# Patient Record
Sex: Male | Born: 1980 | Race: Black or African American | Hispanic: No | Marital: Married | Smoking: Never smoker
Health system: Southern US, Community
[De-identification: ages and names within clinical notes are randomized; demographics above are authoritative.]

## PROBLEM LIST (undated history)

## (undated) DIAGNOSIS — K219 Gastro-esophageal reflux disease without esophagitis: Secondary | ICD-10-CM

## (undated) DIAGNOSIS — G4730 Sleep apnea, unspecified: Secondary | ICD-10-CM

## (undated) DIAGNOSIS — J45909 Unspecified asthma, uncomplicated: Secondary | ICD-10-CM

## (undated) DIAGNOSIS — F419 Anxiety disorder, unspecified: Secondary | ICD-10-CM

## (undated) HISTORY — DX: Anxiety disorder, unspecified: F41.9

## (undated) HISTORY — DX: Sleep apnea, unspecified: G47.30

## (undated) HISTORY — PX: NO PAST SURGERIES: SHX2092

---
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
## 2002-01-03 ENCOUNTER — Emergency Department (HOSPITAL_COMMUNITY): Admission: EM | Admit: 2002-01-03 | Discharge: 2002-01-03 | Payer: Self-pay | Admitting: Emergency Medicine

## 2002-11-30 ENCOUNTER — Emergency Department (HOSPITAL_COMMUNITY): Admission: AD | Admit: 2002-11-30 | Discharge: 2002-12-01 | Payer: Self-pay | Admitting: Emergency Medicine

## 2003-01-11 ENCOUNTER — Encounter: Payer: Self-pay | Admitting: Family Medicine

## 2003-01-11 ENCOUNTER — Ambulatory Visit (HOSPITAL_COMMUNITY): Admission: RE | Admit: 2003-01-11 | Discharge: 2003-01-11 | Payer: Self-pay | Admitting: Family Medicine

## 2007-04-01 ENCOUNTER — Ambulatory Visit (HOSPITAL_COMMUNITY): Admission: RE | Admit: 2007-04-01 | Discharge: 2007-04-01 | Payer: Self-pay | Admitting: Family Medicine

## 2007-06-08 ENCOUNTER — Emergency Department (HOSPITAL_COMMUNITY): Admission: EM | Admit: 2007-06-08 | Discharge: 2007-06-08 | Payer: Self-pay | Admitting: Emergency Medicine

## 2008-01-13 ENCOUNTER — Ambulatory Visit: Admission: RE | Admit: 2008-01-13 | Discharge: 2008-01-13 | Payer: Self-pay | Admitting: Family Medicine

## 2008-07-30 ENCOUNTER — Emergency Department (HOSPITAL_COMMUNITY): Admission: EM | Admit: 2008-07-30 | Discharge: 2008-07-30 | Payer: Self-pay | Admitting: Emergency Medicine

## 2010-09-22 ENCOUNTER — Other Ambulatory Visit: Payer: Self-pay | Admitting: Internal Medicine

## 2010-09-22 DIAGNOSIS — R1011 Right upper quadrant pain: Secondary | ICD-10-CM

## 2010-09-29 ENCOUNTER — Ambulatory Visit
Admission: RE | Admit: 2010-09-29 | Discharge: 2010-09-29 | Disposition: A | Discharge status: Home / Self Care | Payer: Managed Care, Other (non HMO) | Source: Ambulatory Visit | Attending: Internal Medicine | Admitting: Internal Medicine

## 2010-09-29 DIAGNOSIS — R1011 Right upper quadrant pain: Secondary | ICD-10-CM

## 2010-12-26 NOTE — Procedures (Signed)
NAME:  STOKELY, JEANCHARLES          ACCOUNT NO.:  [id number]   MEDICAL RECORD NO.:  [id number]          PATIENT TYPE:  OUT   LOCATION:  SLEE                          FACILITY:  [facility]   PHYSICIAN:  Kofi A. Gerilyn Pilgrim, M.D. DATE OF BIRTH:  1981/04/14   DATE OF PROCEDURE:  01/13/2008  DATE OF DISCHARGE:  01/13/2008                             SLEEP DISORDER REPORT   POLYSOMNOGRAPHY REPORT   REFERRING PHYSICIAN:  Angus G. McInnis, MD   INDICATIONS:  This is a 30 year old man who presents with snoring,  insomnia, and has been evaluated for obstructive sleep apnea syndrome.  Epworth sleepiness scale is 7.  BMI 36.   MEDICATIONS:  None.   SLEEP STAGE SUMMARY:  The total recording time is 395 minutes.  Sleep  efficiency 85%.  Sleep latency 45 minutes.  REM latency 9 minutes.  Stage N1 4%, N2 49%, N3 25%, and REM sleep 22%.   RESPIRATORY SUMMARY:  The AHI is 11 with a lowest oxygen saturation of  84%.  Baseline saturation is 98%.   LIMB MOVEMENT SUMMARY:  There are no limb movements observed.   ELECTROCARDIOGRAM SUMMARY:  Average heart rate is 75 with isolated PVCs  noted.   IMPRESSION:  Mild obstructive sleep apnea syndrome, not requiring  positive pressure. However, he still could benefit from positive  pressure. Therefore, a trials of autotitration unit at home or even a  formal titration study is recommended.      Kofi A. Gerilyn Pilgrim, M.D.  Electronically Signed     KAD/MEDQ  D:  01/17/2008  T:  01/18/2008  Job:  [id number]

## 2015-10-18 ENCOUNTER — Other Ambulatory Visit: Payer: Self-pay | Admitting: Geriatric Medicine

## 2015-10-18 DIAGNOSIS — R1011 Right upper quadrant pain: Secondary | ICD-10-CM

## 2015-10-26 ENCOUNTER — Other Ambulatory Visit: Payer: Managed Care, Other (non HMO)

## 2015-10-26 ENCOUNTER — Ambulatory Visit
Admission: RE | Admit: 2015-10-26 | Discharge: 2015-10-26 | Disposition: A | Discharge status: Home / Self Care | Payer: BLUE CROSS/BLUE SHIELD | Source: Ambulatory Visit | Attending: Geriatric Medicine | Admitting: Geriatric Medicine

## 2015-10-26 DIAGNOSIS — R1011 Right upper quadrant pain: Secondary | ICD-10-CM

## 2015-10-26 MED ORDER — IOPAMIDOL (ISOVUE-300) INJECTION 61%
125.0000 mL | Freq: Once | INTRAVENOUS | Status: AC | PRN
  Administered 2015-10-26: 125 mL via INTRAVENOUS

## 2015-10-27 ENCOUNTER — Other Ambulatory Visit: Payer: Managed Care, Other (non HMO)

## 2015-11-15 ENCOUNTER — Other Ambulatory Visit: Payer: Managed Care, Other (non HMO)

## 2016-04-25 ENCOUNTER — Ambulatory Visit
Admission: RE | Admit: 2016-04-25 | Discharge: 2016-04-25 | Disposition: A | Discharge status: Home / Self Care | Payer: BLUE CROSS/BLUE SHIELD | Source: Ambulatory Visit | Attending: Internal Medicine | Admitting: Internal Medicine

## 2016-04-25 ENCOUNTER — Other Ambulatory Visit: Payer: Self-pay | Admitting: Internal Medicine

## 2016-04-25 DIAGNOSIS — M542 Cervicalgia: Secondary | ICD-10-CM

## 2016-05-29 DIAGNOSIS — K219 Gastro-esophageal reflux disease without esophagitis: Secondary | ICD-10-CM | POA: Insufficient documentation

## 2016-08-02 DIAGNOSIS — G4733 Obstructive sleep apnea (adult) (pediatric): Secondary | ICD-10-CM | POA: Insufficient documentation

## 2016-08-16 ENCOUNTER — Other Ambulatory Visit: Payer: Self-pay | Admitting: Internal Medicine

## 2016-08-16 DIAGNOSIS — M542 Cervicalgia: Secondary | ICD-10-CM

## 2016-08-20 ENCOUNTER — Other Ambulatory Visit: Payer: BLUE CROSS/BLUE SHIELD

## 2016-08-20 ENCOUNTER — Ambulatory Visit
Admission: RE | Admit: 2016-08-20 | Discharge: 2016-08-20 | Disposition: A | Discharge status: Home / Self Care | Payer: BLUE CROSS/BLUE SHIELD | Source: Ambulatory Visit | Attending: Internal Medicine | Admitting: Internal Medicine

## 2016-08-20 DIAGNOSIS — M542 Cervicalgia: Secondary | ICD-10-CM

## 2016-10-14 ENCOUNTER — Emergency Department (HOSPITAL_COMMUNITY)
Admission: EM | Admit: 2016-10-14 | Discharge: 2016-10-14 | Disposition: A | Discharge status: Home / Self Care | Payer: BLUE CROSS/BLUE SHIELD | Attending: Emergency Medicine | Admitting: Emergency Medicine

## 2016-10-14 ENCOUNTER — Emergency Department (HOSPITAL_COMMUNITY): Payer: BLUE CROSS/BLUE SHIELD

## 2016-10-14 ENCOUNTER — Encounter (HOSPITAL_COMMUNITY): Payer: Self-pay

## 2016-10-14 DIAGNOSIS — Z5321 Procedure and treatment not carried out due to patient leaving prior to being seen by health care provider: Secondary | ICD-10-CM | POA: Insufficient documentation

## 2016-10-14 DIAGNOSIS — J45909 Unspecified asthma, uncomplicated: Secondary | ICD-10-CM | POA: Insufficient documentation

## 2016-10-14 DIAGNOSIS — R0789 Other chest pain: Secondary | ICD-10-CM | POA: Insufficient documentation

## 2016-10-14 DIAGNOSIS — R0602 Shortness of breath: Secondary | ICD-10-CM | POA: Diagnosis not present

## 2016-10-14 HISTORY — DX: Unspecified asthma, uncomplicated: J45.909

## 2016-10-14 LAB — BASIC METABOLIC PANEL
Anion gap: 7 (ref 5–15)
BUN: 20 mg/dL (ref 6–20)
CHLORIDE: 106 mmol/L (ref 101–111)
CO2: 27 mmol/L (ref 22–32)
Calcium: 9.7 mg/dL (ref 8.9–10.3)
Creatinine, Ser: 1.19 mg/dL (ref 0.61–1.24)
GFR calc non Af Amer: >60 mL/min (ref >60)
Glucose, Bld: 104 mg/dL — ABNORMAL HIGH (ref 65–99)
POTASSIUM: 3.7 mmol/L (ref 3.5–5.1)
SODIUM: 140 mmol/L (ref 135–145)

## 2016-10-14 LAB — CBC
HEMATOCRIT: 42.9 % (ref 39.0–52.0)
Hemoglobin: 14.1 g/dL (ref 13.0–17.0)
MCH: 28.9 pg (ref 26.0–34.0)
MCHC: 32.9 g/dL (ref 30.0–36.0)
MCV: 87.9 fL (ref 78.0–100.0)
Platelets: 180 10*3/uL (ref 150–400)
RBC: 4.88 MIL/uL (ref 4.22–5.81)
RDW: 12.9 % (ref 11.5–15.5)
WBC: 4.3 10*3/uL (ref 4.0–10.5)

## 2016-10-14 LAB — I-STAT TROPONIN, ED: Troponin i, poc: 0 ng/mL (ref 0.00–0.08)

## 2016-10-14 NOTE — ED Notes (Signed)
Patient transported to X-ray 

## 2016-10-14 NOTE — ED Notes (Signed)
Pt in 37, came back to nurse 1st and stated, Im not going to wait, I have to take my medicine at 12 for anxiety.  I will just call my doctor. This is just too long of a wait

## 2016-10-14 NOTE — ED Triage Notes (Addendum)
Patient complains of chest tightness and some shortness of breath since Thursday, appears anxious on arrival. Non-smoker. Also reports that he has GERD and thinks related. Patient states that he takes lorazepam and has not had any today

## 2016-11-07 ENCOUNTER — Encounter: Payer: Self-pay | Admitting: Emergency Medicine

## 2016-11-07 ENCOUNTER — Emergency Department
Admission: EM | Admit: 2016-11-07 | Discharge: 2016-11-07 | Disposition: A | Discharge status: Home / Self Care | Payer: BLUE CROSS/BLUE SHIELD | Attending: Student in an Organized Health Care Education/Training Program | Admitting: Student in an Organized Health Care Education/Training Program

## 2016-11-07 DIAGNOSIS — J45909 Unspecified asthma, uncomplicated: Secondary | ICD-10-CM | POA: Insufficient documentation

## 2016-11-07 DIAGNOSIS — F0781 Postconcussional syndrome: Secondary | ICD-10-CM | POA: Diagnosis not present

## 2016-11-07 DIAGNOSIS — Y92410 Unspecified street and highway as the place of occurrence of the external cause: Secondary | ICD-10-CM | POA: Diagnosis not present

## 2016-11-07 DIAGNOSIS — R51 Headache: Secondary | ICD-10-CM | POA: Diagnosis not present

## 2016-11-07 DIAGNOSIS — S0990XA Unspecified injury of head, initial encounter: Secondary | ICD-10-CM | POA: Diagnosis present

## 2016-11-07 DIAGNOSIS — Y9389 Activity, other specified: Secondary | ICD-10-CM | POA: Insufficient documentation

## 2016-11-07 DIAGNOSIS — Y999 Unspecified external cause status: Secondary | ICD-10-CM | POA: Insufficient documentation

## 2016-11-07 MED ORDER — NAPROXEN 500 MG PO TABS: 500.0000 mg | ORAL_TABLET | Freq: Two times a day (BID) | ORAL | 0 refills | Status: DC

## 2016-11-07 MED ORDER — MECLIZINE HCL 32 MG PO TABS: 32.0000 mg | ORAL_TABLET | Freq: Three times a day (TID) | ORAL | 0 refills | Status: DC | PRN
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
## 2016-12-04 DIAGNOSIS — E669 Obesity, unspecified: Secondary | ICD-10-CM | POA: Insufficient documentation

## 2016-12-04 DIAGNOSIS — F411 Generalized anxiety disorder: Secondary | ICD-10-CM | POA: Insufficient documentation

## 2017-09-17 DIAGNOSIS — G4733 Obstructive sleep apnea (adult) (pediatric): Secondary | ICD-10-CM | POA: Diagnosis not present

## 2017-09-26 DIAGNOSIS — J3489 Other specified disorders of nose and nasal sinuses: Secondary | ICD-10-CM | POA: Diagnosis not present

## 2017-09-26 DIAGNOSIS — R6883 Chills (without fever): Secondary | ICD-10-CM | POA: Diagnosis not present

## 2017-10-30 DIAGNOSIS — G4733 Obstructive sleep apnea (adult) (pediatric): Secondary | ICD-10-CM | POA: Diagnosis not present

## 2017-11-10 DIAGNOSIS — G4733 Obstructive sleep apnea (adult) (pediatric): Secondary | ICD-10-CM | POA: Diagnosis not present

## 2017-12-10 DIAGNOSIS — G4733 Obstructive sleep apnea (adult) (pediatric): Secondary | ICD-10-CM | POA: Diagnosis not present

## 2018-01-10 DIAGNOSIS — G4733 Obstructive sleep apnea (adult) (pediatric): Secondary | ICD-10-CM | POA: Diagnosis not present

## 2018-02-09 DIAGNOSIS — G4733 Obstructive sleep apnea (adult) (pediatric): Secondary | ICD-10-CM | POA: Diagnosis not present

## 2018-03-12 DIAGNOSIS — G4733 Obstructive sleep apnea (adult) (pediatric): Secondary | ICD-10-CM | POA: Diagnosis not present

## 2018-03-13 DIAGNOSIS — K644 Residual hemorrhoidal skin tags: Secondary | ICD-10-CM | POA: Diagnosis not present

## 2018-03-13 DIAGNOSIS — Z0000 Encounter for general adult medical examination without abnormal findings: Secondary | ICD-10-CM | POA: Diagnosis not present

## 2018-03-13 DIAGNOSIS — G4733 Obstructive sleep apnea (adult) (pediatric): Secondary | ICD-10-CM | POA: Diagnosis not present

## 2018-03-13 LAB — LIPID PANEL
CHOLESTEROL: 201 — AB (ref 0–200)
HDL: 71 — AB (ref 35–70)
LDL CALC: 109
LDl/HDL Ratio: 2.8
Triglycerides: 102 (ref 40–160)

## 2018-03-25 DIAGNOSIS — G4733 Obstructive sleep apnea (adult) (pediatric): Secondary | ICD-10-CM | POA: Diagnosis not present

## 2018-04-12 DIAGNOSIS — G4733 Obstructive sleep apnea (adult) (pediatric): Secondary | ICD-10-CM | POA: Diagnosis not present

## 2018-05-12 DIAGNOSIS — G4733 Obstructive sleep apnea (adult) (pediatric): Secondary | ICD-10-CM | POA: Diagnosis not present

## 2018-08-19 DIAGNOSIS — J4520 Mild intermittent asthma, uncomplicated: Secondary | ICD-10-CM | POA: Diagnosis not present

## 2018-08-19 DIAGNOSIS — J189 Pneumonia, unspecified organism: Secondary | ICD-10-CM | POA: Diagnosis not present

## 2018-09-12 ENCOUNTER — Ambulatory Visit: Payer: 59 | Admitting: Internal Medicine

## 2018-09-30 ENCOUNTER — Encounter: Payer: Self-pay | Admitting: Family Medicine

## 2018-09-30 ENCOUNTER — Ambulatory Visit (INDEPENDENT_AMBULATORY_CARE_PROVIDER_SITE_OTHER): Payer: 59 | Admitting: Family Medicine

## 2018-09-30 VITALS — BP 138/98 | HR 72 | Temp 98.1°F | Ht 72.0 in | Wt 271.2 lb

## 2018-09-30 DIAGNOSIS — R1011 Right upper quadrant pain: Secondary | ICD-10-CM | POA: Diagnosis not present

## 2018-09-30 DIAGNOSIS — F419 Anxiety disorder, unspecified: Secondary | ICD-10-CM

## 2018-09-30 MED ORDER — LORAZEPAM 1 MG PO TABS: 1.0000 mg | ORAL_TABLET | Freq: Every day | ORAL | 0 refills | Status: DC

## 2018-09-30 NOTE — Assessment & Plan Note (Signed)
Concerning for GB vs GERD. Will get [place] to rule-out GB. Also advised dietary changes to help with reflux.

## 2018-09-30 NOTE — Assessment & Plan Note (Signed)
GAD elevated today. Strongly advised against regular use of lorazepam and advised starting SSRI though patient declined. He is hoping to quit alcohol which has helped in the past. Will continue to monitor and encourage SSRI if symptoms worse. Will need contract at next appointment for lorazepam.

## 2018-09-30 NOTE — Patient Instructions (Signed)
#  Stomach issue - cut back on alcohol - we will get an ultrasound - If the Ultrasound does not show gallbladder - likely heartburn  #anxiety - consider starting a long acting medication for anxiety - like Zoloft or Prozac - refill of Lorazepam - Return for appointment before you run out of medication - likely 6 weeks

## 2018-09-30 NOTE — Progress Notes (Signed)
Subjective:     Carlos Wheeler is a 38 y.o. male presenting for [facility] (previous PCP with [facility]); Stomach issue (Symptoms have been present for a few years. Certain foods make him feel bloated after consuming. Also gets a burning sensation in the upper right abdomen area. Previous PCP has addressed this.); and Insomnia (takes Lorazepam at bedtime. Does wear CPAP machine-does not feel like this works. )     HPI   #Anxiety - taking lorazepam at night to calm him down - has 3 year olds that often sleep with them - wears CPAP - has been busy with children, building a house, starting a business - tried Buspar w/o improvement - hx of ADHD and took Ritalin as child but completed college/grad school w/o issue   Uses alcohol to end the day Feels he should cut back When he cut out alcohol for 6 months - he didn't need anything  #Stomach issue - when drinking certain foods or alcohol on an empty stomach - stomach will feel full - endorses some mild burning which will go away - if full stomach will feel better - triggered with greasy food  - location is right upper quadrant - lasts for about 1 hour - will also feel bloated - sister and grandparents with gallbladder issues - tried omeprazole w/o improvement - tea with ginger seems to help   Review of Systems  Gastrointestinal: Positive for abdominal distention and abdominal pain. Negative for diarrhea, nausea and vomiting.  Psychiatric/Behavioral: Negative for suicidal ideas. The patient is nervous/anxious.      Social History   Tobacco Use  Smoking Status Never Smoker  Smokeless Tobacco Never Used        Objective:    BP Readings from Last 3 Encounters:  09/30/18 (!) 138/98  11/07/16 (!) 141/90  10/14/16 155/96   Wt Readings from Last 3 Encounters:  09/30/18 271 lb 4 oz (123 kg)  11/07/16 251 lb (113.9 kg)  10/14/16 250 lb (113.4 kg)    BP (!) 138/98   Pulse 72   Temp 98.1 F (36.7  C)   Ht 6' (1.829 m)   Wt 271 lb 4 oz (123 kg)   SpO2 96%   BMI 36.79 kg/m    Physical Exam Constitutional:      Appearance: Normal appearance. He is not ill-appearing or diaphoretic.  HENT:     Right Ear: External ear normal.     Left Ear: External ear normal.     Nose: Nose normal.  Eyes:     General: No scleral icterus.    Extraocular Movements: Extraocular movements intact.     Conjunctiva/sclera: Conjunctivae normal.  Neck:     Musculoskeletal: Neck supple.  Cardiovascular:     Rate and Rhythm: Normal rate and regular rhythm.     Heart sounds: No murmur.  Pulmonary:     Effort: Pulmonary effort is normal. No respiratory distress.     Breath sounds: Normal breath sounds. No wheezing.  Abdominal:     General: Abdomen is flat. Bowel sounds are normal.     Palpations: Abdomen is soft.     Tenderness: There is no abdominal tenderness. There is no guarding or rebound. Negative signs include Murphy's sign.  Skin:    General: Skin is warm and dry.  Neurological:     Mental Status: He is alert. Mental status is at baseline.  Psychiatric:        Mood and Affect: Mood normal.  GAD 7 : Generalized Anxiety Score 09/30/2018  Nervous, Anxious, on Edge 3  Control/stop worrying 1  Worry too much - different things 2  Trouble relaxing 3  Restless 3  Easily annoyed or irritable 1  Afraid - awful might happen 1  Total GAD 7 Score 14  Anxiety Difficulty Not difficult at all          Assessment & Plan:   Problem List Items Addressed This Visit      Other   Colicky RUQ abdominal pain - Primary    Concerning for GB vs GERD. Will get [place] to rule-out GB. Also advised dietary changes to help with reflux.       Relevant Orders   US Abdomen Limited RUQ   Anxiety    GAD elevated today. Strongly advised against regular use of lorazepam and advised starting SSRI though patient declined. He is hoping to quit alcohol which has helped in the past. Will continue to monitor and  encourage SSRI if symptoms worse. Will need contract at next appointment for lorazepam.       Relevant Medications   LORazepam (ATIVAN) 1 MG tablet       Return in about 6 weeks (around 11/11/2018).  [name], MD

## 2018-10-06 ENCOUNTER — Ambulatory Visit
Admission: RE | Admit: 2018-10-06 | Discharge: 2018-10-06 | Disposition: A | Discharge status: Home / Self Care | Payer: 59 | Source: Ambulatory Visit | Attending: Family Medicine | Admitting: Family Medicine

## 2018-10-06 DIAGNOSIS — R140 Abdominal distension (gaseous): Secondary | ICD-10-CM | POA: Diagnosis not present

## 2018-10-06 DIAGNOSIS — R1011 Right upper quadrant pain: Secondary | ICD-10-CM

## 2018-10-07 ENCOUNTER — Telehealth: Payer: Self-pay | Admitting: Family Medicine

## 2018-10-07 DIAGNOSIS — K219 Gastro-esophageal reflux disease without esophagitis: Secondary | ICD-10-CM

## 2018-10-07 MED ORDER — PANTOPRAZOLE SODIUM 20 MG PO TBEC: 40.0000 mg | DELAYED_RELEASE_TABLET | Freq: Every day | ORAL | 1 refills | Status: DC

## 2018-10-07 NOTE — Telephone Encounter (Signed)
Routing to MA to notify patient of the following:   1. Ultrasound was normal. No sign of gallstones  2. Suspect pain may be reflux related - sent a prescription for Pantoprazole to the pharmacy. Take this on an empty stomach and then eat with in 30-60 minutes.    3. Do not lay down immediately after eating, consider elevating the head of your bed. Avoid foods that trigger symptoms   4. Make a follow-up appointment in 4 weeks to check in  [name]

## 2018-10-07 NOTE — Telephone Encounter (Signed)
Patient advised.

## 2018-10-24 ENCOUNTER — Ambulatory Visit (INDEPENDENT_AMBULATORY_CARE_PROVIDER_SITE_OTHER): Payer: 59 | Admitting: Family Medicine

## 2018-10-24 ENCOUNTER — Other Ambulatory Visit: Payer: Self-pay

## 2018-10-24 ENCOUNTER — Encounter: Payer: Self-pay | Admitting: Family Medicine

## 2018-10-24 VITALS — BP 146/74 | HR 88 | Temp 98.9°F | Ht 72.0 in | Wt 264.2 lb

## 2018-10-24 DIAGNOSIS — J209 Acute bronchitis, unspecified: Secondary | ICD-10-CM | POA: Diagnosis not present

## 2018-10-24 MED ORDER — AZITHROMYCIN 250 MG PO TABS: ORAL_TABLET | ORAL | 0 refills | Status: DC

## 2018-10-24 MED ORDER — PREDNISONE 10 MG PO TABS: ORAL_TABLET | ORAL | 0 refills | Status: DC

## 2018-10-24 MED ORDER — HYDROCOD POLST-CPM POLST ER 10-8 MG/5ML PO SUER: 5.0000 mL | Freq: Every evening | ORAL | 0 refills | Status: DC | PRN

## 2018-10-24 NOTE — Patient Instructions (Signed)
Use your nebulized albuterol as needed every 4-6 hours for wheezing  If wheezing worsens-fill the px for prednisone   Drink lots of fluid  Rest when you can - I sent cough syrup (tussionex)   Take zpak as directed for sinus or lung infection    Update if not starting to improve in a week or if worsening

## 2018-10-24 NOTE — Progress Notes (Signed)
Subjective:    Patient ID: Carlos Wheeler, male    DOB: Dec 20, 1980, 38 y.o.   MRN: [id number]  HPI  38 yo pt of Dr Selena Batten here with URI symptoms   Was diagnosed with pneumonia at [facility] ([facility]) -at [facility] about a month ago Has had it before   Took only a few days of abx because they made him feel bad- cannot remember the name of it  GI side effects  He still felt some better   Then over a week ago- got worse again  Wheezing - Albuterol 2.5 -in nebulizer  Also MDI -has not used  Coughing a lot at night  Also wears cpap -hard to get rest   Nasal symptoms - very congested  Afrin -about 3 days    Cough is productive (was green/not clear)   No fever  Feels warm occ No chills or aches   Has asthma symptoms (diagnosed 15 y ago) Flares up when he gets sick Also has allergies ([place])   He does not like prednisone- does not like the way he feels   Hydrocodone-chloramphen ER -about to run out (70 ml)  Makes him sleepy   otc Mucinex DM    Temp: 98.9 F (37.2 C)  Pulse ox is 94%  Patient Active Problem List   Diagnosis Date Noted  . Acute bronchitis 10/24/2018  . Colicky RUQ abdominal pain 09/30/2018  . Anxiety 09/30/2018  . Obesity (BMI 30.0-34.9) 12/04/2016  . Obstructive sleep apnea of adult 08/02/2016   Past Medical History:  Diagnosis Date  . Anxiety   . Asthma   . Sleep apnea    Past Surgical History:  Procedure Laterality Date  . NO PAST SURGERIES     Social History   Tobacco Use  . Smoking status: Never Smoker  . Smokeless tobacco: Never Used  Substance Use Topics  . Alcohol use: Yes    Comment: sometimes daily- 2-3 liquor servings  . Drug use: No   Family History  Problem Relation Age of Onset  . Hyperlipidemia Mother   . Hypertension Mother   . Heart attack Father 63  . Heart disease Father   . Hypertension Father   . Diabetes Sister   . Hyperlipidemia Sister   . Hypertension Sister   . Diabetes Maternal Grandmother   .  Hypertension Maternal Grandmother   . Diabetes Maternal Grandfather   . Hypertension Maternal Grandfather   . Heart attack Maternal Grandfather 70  . Diabetes Paternal Grandmother   . Stroke Paternal Grandmother 21  . Diabetes Paternal Grandfather   . Cancer Neg Hx    Allergies  Allergen Reactions  . Amoxicillin Nausea Only    Other reaction(s): Other (See Comments), Vomiting Causes N/V and GI upset  . Montelukast     Other reaction(s): Confusion (intolerance), Other (See Comments) Causes forgetfulness    Current Outpatient Medications on File Prior to Visit  Medication Sig Dispense Refill  . albuterol (PROVENTIL HFA;VENTOLIN HFA) 108 (90 Base) MCG/ACT inhaler Inhale 1-2 puffs into the lungs every 6 (six) hours as needed.     Marland Kitchen LORazepam (ATIVAN) 1 MG tablet Take 1 tablet (1 mg total) by mouth at bedtime. 30 tablet 0  . pantoprazole (PROTONIX) 20 MG tablet Take 2 tablets (40 mg total) by mouth daily. 30 tablet 1   No current facility-administered medications on file prior to visit.     Review of Systems  Constitutional: Positive for appetite change and fatigue. Negative for fever.  HENT: Positive for congestion, postnasal drip, rhinorrhea, sinus pressure, sneezing and sore throat. Negative for ear pain.   Eyes: Negative for pain and discharge.  Respiratory: Positive for cough, chest tightness and wheezing. Negative for shortness of breath and stridor.   Cardiovascular: Negative for chest pain.  Gastrointestinal: Negative for diarrhea, nausea and vomiting.  Genitourinary: Negative for frequency, hematuria and urgency.  Musculoskeletal: Negative for arthralgias and myalgias.  Skin: Negative for rash.  Neurological: Positive for headaches. Negative for dizziness, weakness and light-headedness.  Psychiatric/Behavioral: Negative for confusion and dysphoric mood.       Objective:   Physical Exam Constitutional:      General: He is not in acute distress.    Appearance: Normal  appearance. He is well-developed. He is obese. He is not ill-appearing, toxic-appearing or diaphoretic.  HENT:     Head: Normocephalic and atraumatic.     Comments: Nares are injected and congested      Right Ear: Tympanic membrane, ear canal and external ear normal.     Left Ear: Tympanic membrane, ear canal and external ear normal.     Nose: Congestion and rhinorrhea present.     Mouth/Throat:     Mouth: Mucous membranes are moist.     Pharynx: Oropharynx is clear. No oropharyngeal exudate or posterior oropharyngeal erythema.     Comments: Clear pnd  Eyes:     General:        Right eye: No discharge.        Left eye: No discharge.     Conjunctiva/sclera: Conjunctivae normal.     Pupils: Pupils are equal, round, and reactive to light.  Neck:     Musculoskeletal: Normal range of motion and neck supple.  Cardiovascular:     Rate and Rhythm: Normal rate.     Heart sounds: Normal heart sounds.  Pulmonary:     Effort: Pulmonary effort is normal. No respiratory distress.     Breath sounds: No stridor. Wheezing and rhonchi present. No rales.     Comments: Mild exp wheezes No prolonged exp phase Scattered rhonchi Chest:     Chest wall: No tenderness.  Lymphadenopathy:     Cervical: No cervical adenopathy.  Skin:    General: Skin is warm and dry.     Capillary Refill: Capillary refill takes less than 2 seconds.     Findings: No rash.  Neurological:     Mental Status: He is alert.     Cranial Nerves: No cranial nerve deficit.  Psychiatric:        Mood and Affect: Mood normal.           Assessment & Plan:   Problem List Items Addressed This Visit      Respiratory   Acute bronchitis - Primary    In the setting of partially tx pneumonia in January  Reassuring exam  Pt has wheezing but prefers no prednisone unless abs necessary (printed px for 30 mg taper to fill if he worsens)  Will continue albuterol Px zpak and tussionex (caution of sedation and habit) Disc  symptomatic care - see instructions on AVS  AVS Use your nebulized albuterol as needed every 4-6 hours for wheezing  If wheezing worsens-fill the px for prednisone   Drink lots of fluid  Rest when you can - I sent cough syrup (tussionex)   Take zpak as directed for sinus or lung infection    Update if not starting to improve in a week or if worsening

## 2018-10-26 NOTE — Assessment & Plan Note (Signed)
In the setting of partially tx pneumonia in January  Reassuring exam  Pt has wheezing but prefers no prednisone unless abs necessary (printed px for 30 mg taper to fill if he worsens)  Will continue albuterol Px zpak and tussionex (caution of sedation and habit) Disc symptomatic care - see instructions on AVS  AVS Use your nebulized albuterol as needed every 4-6 hours for wheezing  If wheezing worsens-fill the px for prednisone   Drink lots of fluid  Rest when you can - I sent cough syrup (tussionex)   Take zpak as directed for sinus or lung infection    Update if not starting to improve in a week or if worsening

## 2018-11-25 ENCOUNTER — Ambulatory Visit (INDEPENDENT_AMBULATORY_CARE_PROVIDER_SITE_OTHER): Payer: 59 | Admitting: Family Medicine

## 2018-11-25 ENCOUNTER — Encounter: Payer: Self-pay | Admitting: Family Medicine

## 2018-11-25 ENCOUNTER — Other Ambulatory Visit: Payer: Self-pay

## 2018-11-25 VITALS — Temp 97.6°F

## 2018-11-25 DIAGNOSIS — K219 Gastro-esophageal reflux disease without esophagitis: Secondary | ICD-10-CM | POA: Diagnosis not present

## 2018-11-25 DIAGNOSIS — R030 Elevated blood-pressure reading, without diagnosis of hypertension: Secondary | ICD-10-CM | POA: Insufficient documentation

## 2018-11-25 DIAGNOSIS — F419 Anxiety disorder, unspecified: Secondary | ICD-10-CM | POA: Diagnosis not present

## 2018-11-25 DIAGNOSIS — R631 Polydipsia: Secondary | ICD-10-CM | POA: Diagnosis not present

## 2018-11-25 NOTE — Progress Notes (Signed)
I connected with Carlos Wheeler on 11/25/18 at  2:40 PM EDT by video and verified that I am speaking with the correct person using two identifiers.   I discussed the limitations, risks, security and privacy concerns of performing an evaluation and management service by video and the availability of in person appointments. I also discussed with the patient that there may be a patient responsible charge related to this service. The patient expressed understanding and agreed to proceed.  Patient location: Home Provider Location: [place] [facility] [place] Participants: Carlos ChildJessica R Malvin Morrish and Carlos Wheeler   Subjective:     Carlos Wheeler is a 38 y.o. 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  Gastrointestinal: Positive for abdominal pain (hungar pains). Negative for nausea and vomiting.  Endocrine: Positive for polydipsia. Negative for polyuria.  Psychiatric/Behavioral: Negative for decreased concentration. The patient is not nervous/anxious.     09/30/2018: Clinic - Abdominal pain - normal US, started pantoprazol and preventive GERD treatment. Anxiety - declined SSRI  Social History   Tobacco Use  Smoking Status Never Smoker  Smokeless Tobacco Never Used        Objective:   BP Readings from Last 3 Encounters:  10/24/18 (!) 146/74  09/30/18 (!) 138/98  11/07/16 (!) 141/90    Wt Readings from Last 3 Encounters:  10/24/18 264 lb 3 oz (119.8 kg)  09/30/18 271 lb 4 oz (123 kg)  11/07/16 251 lb (113.9 kg)    Temp 97.6 F (36.4 C)    Physical Exam Constitutional:      Appearance: Normal appearance.  HENT:     Head: Normocephalic and atraumatic.     Nose: Nose normal.  Eyes:     Conjunctiva/sclera: Conjunctivae normal.  Pulmonary:     Effort: Pulmonary effort is normal. No respiratory distress.     Breath sounds: No wheezing.  Neurological:     Mental Status: He is alert.       GAD 7 : Generalized Anxiety Score 11/25/2018 09/30/2018  Nervous, Anxious, on Edge 2 3  Control/stop worrying 1 1  Worry too much - different things 2 2  Trouble relaxing 0 3  Restless 2 3  Easily annoyed or irritable 0 1  Afraid - awful might happen 0 1  Total GAD 7 Score 7 14  Anxiety Difficulty Not difficult at all Not difficult at all         Assessment & Plan:   Problem List Items Addressed This Visit      Other   Anxiety - Primary    Improved, continues with rare use of Lorazepam. Will continue to monitoring. If benzo use increases will readdress need for daily medication      Increased thirst    May be dehydration due to CPAP machine, but also discussed screening for diabetes given  weight and famhx. Pt will wait until annual this summer to do additional lab tests.       Elevated blood pressure reading    Unable to obtain BP today due to video visit - will plan to follow-up at next annual       Other Visit Diagnoses    Gastroesophageal reflux disease, esophagitis presence not specified         GERD seems resolved  Return in about 3 months (around 02/24/2019) for annual visit and labs.  Carlos Child, MD

## 2018-11-25 NOTE — Assessment & Plan Note (Signed)
May be dehydration due to CPAP machine, but also discussed screening for diabetes given weight and famhx. Pt will wait until annual this summer to do additional lab tests.

## 2018-11-25 NOTE — Assessment & Plan Note (Signed)
Unable to obtain BP today due to video visit - will plan to follow-up at next annual

## 2018-11-25 NOTE — Assessment & Plan Note (Signed)
Improved, continues with rare use of Lorazepam. Will continue to monitoring. If benzo use increases will readdress need for daily medication

## 2018-11-26 DIAGNOSIS — G4733 Obstructive sleep apnea (adult) (pediatric): Secondary | ICD-10-CM | POA: Diagnosis not present

## 2018-12-06 DIAGNOSIS — J069 Acute upper respiratory infection, unspecified: Secondary | ICD-10-CM | POA: Diagnosis not present

## 2019-02-05 ENCOUNTER — Ambulatory Visit (INDEPENDENT_AMBULATORY_CARE_PROVIDER_SITE_OTHER): Payer: 59 | Admitting: Family Medicine

## 2019-02-05 ENCOUNTER — Encounter: Payer: Self-pay | Admitting: Family Medicine

## 2019-02-05 ENCOUNTER — Other Ambulatory Visit: Payer: Self-pay

## 2019-02-05 VITALS — BP 148/96 | HR 70 | Temp 98.4°F | Resp 18 | Ht 72.0 in | Wt 260.0 lb

## 2019-02-05 DIAGNOSIS — I10 Essential (primary) hypertension: Secondary | ICD-10-CM

## 2019-02-05 DIAGNOSIS — Z0000 Encounter for general adult medical examination without abnormal findings: Secondary | ICD-10-CM | POA: Diagnosis not present

## 2019-02-05 DIAGNOSIS — F419 Anxiety disorder, unspecified: Secondary | ICD-10-CM | POA: Diagnosis not present

## 2019-02-05 DIAGNOSIS — R7309 Other abnormal glucose: Secondary | ICD-10-CM

## 2019-02-05 LAB — COMPREHENSIVE METABOLIC PANEL
ALT: 19 U/L (ref 0–53)
AST: 19 U/L (ref 0–37)
Albumin: 4.9 g/dL (ref 3.5–5.2)
Alkaline Phosphatase: 45 U/L (ref 39–117)
BUN: 21 mg/dL (ref 6–23)
CO2: 27 mEq/L (ref 19–32)
Calcium: 9.8 mg/dL (ref 8.4–10.5)
Chloride: 102 mEq/L (ref 96–112)
Creatinine, Ser: 1.13 mg/dL (ref 0.40–1.50)
GFR: 88.85 mL/min (ref >60.00)
Glucose, Bld: 96 mg/dL (ref 70–99)
Potassium: 4.2 mEq/L (ref 3.5–5.1)
Sodium: 139 mEq/L (ref 135–145)
Total Bilirubin: 0.7 mg/dL (ref 0.2–1.2)
Total Protein: 7.3 g/dL (ref 6.0–8.3)

## 2019-02-05 LAB — CBC
HCT: 42.6 % (ref 39.0–52.0)
Hemoglobin: 14.3 g/dL (ref 13.0–17.0)
MCHC: 33.6 g/dL (ref 30.0–36.0)
MCV: 89.2 fl (ref 78.0–100.0)
Platelets: 216 10*3/uL (ref 150.0–400.0)
RBC: 4.78 Mil/uL (ref 4.22–5.81)
RDW: 13.7 % (ref 11.5–15.5)
WBC: 4.9 10*3/uL (ref 4.0–10.5)

## 2019-02-05 LAB — HEMOGLOBIN A1C: Hgb A1c MFr Bld: 5.9 % (ref 4.6–6.5)

## 2019-02-05 LAB — TSH: TSH: 2.93 u[IU]/mL (ref 0.35–4.50)

## 2019-02-05 MED ORDER — HYDROXYZINE HCL 50 MG PO TABS: 50.0000 mg | ORAL_TABLET | Freq: Three times a day (TID) | ORAL | 1 refills | Status: DC | PRN

## 2019-02-05 NOTE — Progress Notes (Signed)
Annual Exam   Chief Complaint:  Chief Complaint  Patient presents with  . Annual Exam    History of Present Illness:  Carlos Wheeler is a 38 y.o. presents today for annual examination.    #Anxiety - has been taking more ativan at night for sleep - taking 1/2 the tablet - but has noticed that the side effects linger through the day -   Nutrition/Lifestyle Diet: does not eat a lot of greesy food, eats a lot sandwiches these days, breakfast picks up fast food Exercise: lately not much, was doing cardio but with the gym closed, active with job He is single partner, contraception - tubal ligation.   Does not check BP at home  Social History   Tobacco Use  Smoking Status Never Smoker  Smokeless Tobacco Never Used   Social History   Substance and Sexual Activity  Alcohol Use Yes   Comment: sometimes daily- 2-3 liquor servings   Social History   Substance and Sexual Activity  Drug Use No     Safety The patient wears seatbelts: yes.     The patient feels safe at home and in their relationships: yes.  General Health Dentist in the last year: Yes Eye doctor: not applicable  Weight Wt Readings from Last 3 Encounters:  02/05/19 260 lb (117.9 kg)  10/24/18 264 lb 3 oz (119.8 kg)  09/30/18 271 lb 4 oz (123 kg)   Patient has high BMI  BMI Readings from Last 1 Encounters:  02/05/19 35.26 kg/m     Chronic disease screening Blood pressure monitoring:  BP Readings from Last 3 Encounters:  02/05/19 (!) 148/96  10/24/18 (!) 146/74  09/30/18 (!) 138/98    Lipid Monitoring: Indication for screening: age >35, obesity, diabetes, family hx, CV risk factors.  Lipid screening: Yes  Lab Results  Component Value Date   CHOL 201 (A) 03/13/2018   HDL 71 (A) 03/13/2018   LDLCALC 109 03/13/2018   TRIG 102 03/13/2018     Diabetes Screening: age 24>40, overweight, family hx, PCOS, hx of gestational diabetes, at risk ethnicity, elevated blood pressure >135/80.   Diabetes Screening screening: Yes  No results found for: HGBA1C   Immunization History  Administered Date(s) Administered  . Td 09/22/2008  . Tdap 09/26/2015    Past Medical History:  Diagnosis Date  . Anxiety   . Asthma   . Sleep apnea     Past Surgical History:  Procedure Laterality Date  . NO PAST SURGERIES      Prior to Admission medications   Medication Sig Start Date End Date Taking? Authorizing Provider  albuterol (PROVENTIL HFA;VENTOLIN HFA) 108 (90 Base) MCG/ACT inhaler Inhale 1-2 puffs into the lungs every 6 (six) hours as needed.    Yes [provider]  LORazepam (ATIVAN) 1 MG tablet Take 1 tablet (1 mg total) by mouth at bedtime. 09/30/18  Yes Lynnda Childody, Muscab Brenneman R, MD    Allergies  Allergen Reactions  . Amoxicillin Nausea Only    Other reaction(s): Other (See Comments), Vomiting Causes N/V and GI upset  . Montelukast     Other reaction(s): Confusion (intolerance), Other (See Comments) Causes forgetfulness   . Prednisone      Social History   Socioeconomic History  . Marital status: Married    Spouse name: Carlos Wheeler  . Number of children: 2  . Years of education: MBA  . Highest education level: Not on file  Occupational History  . Not on file  Social Needs  .  Financial resource strain: Not hard at all  . Food insecurity    Worry: Not on file    Inability: Not on file  . Transportation needs    Medical: Not on file    Non-medical: Not on file  Tobacco Use  . Smoking status: Never Smoker  . Smokeless tobacco: Never Used  Substance and Sexual Activity  . Alcohol use: Yes    Comment: sometimes daily- 2-3 liquor servings  . Drug use: No  . Sexual activity: Yes    Birth control/protection: Surgical  Lifestyle  . Physical activity    Days per week: Not on file    Minutes per session: Not on file  . Stress: Not on file  Relationships  . Social Herbalist on phone: Not on file    Gets together: Not on file    Attends  religious service: Not on file    Active member of club or organization: Not on file    Attends meetings of clubs or organizations: Not on file    Relationship status: Not on file  . Intimate partner violence    Fear of current or ex partner: Not on file    Emotionally abused: Not on file    Physically abused: Not on file    Forced sexual activity: Not on file  Other Topics Concern  . Not on file  Social History Narrative   09/30/18   Lives with wife and 2 children - twin boy (Carlos Wheeler) and girl (Carlos Wheeler) - born 2017   Enjoys: drag race, working on cars - trying to start a business   Work: Art gallery manager   Diet: eats good - grilled, avoids fried foods, carbs; does not eat very frequent   Exercise: does cardio every week    Family History  Problem Relation Age of Onset  . Hyperlipidemia Mother   . Hypertension Mother   . Heart attack Father 45  . Heart disease Father   . Hypertension Father   . Diabetes Sister   . Hyperlipidemia Sister   . Hypertension Sister   . Diabetes Maternal Grandmother   . Hypertension Maternal Grandmother   . Diabetes Maternal Grandfather   . Hypertension Maternal Grandfather   . Heart attack Maternal Grandfather 70  . Diabetes Paternal Grandmother   . Stroke Paternal Grandmother 50  . Diabetes Paternal Grandfather   . Cancer Neg Hx     Review of Systems  Constitutional: Negative for chills and fever.  HENT: Negative for congestion, sinus pain and sore throat.   Eyes: Negative for blurred vision and double vision.  Respiratory: Negative for cough and shortness of breath.   Cardiovascular: Negative for chest pain, palpitations and PND.  Gastrointestinal: Negative for diarrhea, heartburn, nausea and vomiting.  Genitourinary: Negative.   Musculoskeletal: Negative for myalgias.  Skin: Negative for itching and rash.  Neurological: Negative for dizziness and headaches.  Endo/Heme/Allergies: Negative for environmental allergies.   Psychiatric/Behavioral: Negative for depression. The patient is nervous/anxious.      Physical Exam BP (!) 148/96   Pulse 70   Temp 98.4 F (36.9 C)   Resp 18   Ht 6' (1.829 m)   Wt 260 lb (117.9 kg)   BMI 35.26 kg/m    BP Readings from Last 3 Encounters:  02/05/19 (!) 148/96  10/24/18 (!) 146/74  09/30/18 (!) 138/98      Physical Exam Constitutional:      General: He is not in acute distress.  Appearance: He is well-developed. He is not diaphoretic.  HENT:     Head: Normocephalic and atraumatic.     Right Ear: Tympanic membrane and ear canal normal.     Left Ear: Tympanic membrane and ear canal normal.     Nose: Nose normal.     Mouth/Throat:     Pharynx: Uvula midline.  Eyes:     General: No scleral icterus.    Conjunctiva/sclera: Conjunctivae normal.     Pupils: Pupils are equal, round, and reactive to light.  Neck:     Musculoskeletal: Normal range of motion and neck supple.  Cardiovascular:     Rate and Rhythm: Normal rate and regular rhythm.     Heart sounds: Normal heart sounds. No murmur.  Pulmonary:     Effort: Pulmonary effort is normal. No respiratory distress.     Breath sounds: Normal breath sounds. No wheezing.  Abdominal:     General: Bowel sounds are normal. There is no distension.     Palpations: Abdomen is soft. There is no mass.     Tenderness: There is no abdominal tenderness. There is no guarding.  Musculoskeletal: Normal range of motion.  Lymphadenopathy:     Cervical: No cervical adenopathy.  Skin:    General: Skin is warm and dry.     Capillary Refill: Capillary refill takes less than 2 seconds.  Neurological:     Mental Status: He is alert and oriented to person, place, and time.        Results: AUDIT Questionnaire (screen for alcoholism):  high risk, patient is going to work on cutting back from liquor consumption PHQ-9:    [facility] [place] [place]  PHQ-9 Total Score  5       [facility]  Decreased Interest 0  Down, Depressed, Hopeless 0  PHQ - 2 Score 0  Altered sleeping 2  Tired, decreased energy 2  Change in appetite 0  Feeling bad or failure about yourself  0  Trouble concentrating 1  Moving slowly or fidgety/restless 0  Suicidal thoughts 0  PHQ-9 Score 5  Difficult doing work/chores Not difficult at all       Assessment: 38 y.o. here for routine annual physical examination.  Plan: Problem List Items Addressed This Visit      Cardiovascular and Mediastinum   Essential hypertension    Given recurrent elevated readings meets diagnosis. Is motivated to do life style changes so will plan for 4 month trial and reassess.       Relevant Orders   Comprehensive metabolic panel   CBC   TSH     Other   Anxiety    Advised trying hydroxyzine at night instead of ativan as he is having lingering symptoms during the day. Only needing occasionally currently      Relevant Medications   hydrOXYzine (ATARAX/VISTARIL) 50 MG tablet   Elevated random blood glucose level - Primary    Will screen for diabetes today given prior elevated number and now with HTN      Relevant Orders   Hemoglobin A1c    Other Visit Diagnoses    Annual physical exam          Screening: -- Blood pressure screen elevated: continued to monitor. -- cholesterol screening: will obtain -- Weight screening: overweight: continue to monitor -- Diabetes Screening: will obtain -- Nutrition: normal     Psych -- Depression screening (PHQ-9): no intervention, previously declined daily  medication   [facility]  PHQ-9 Total Score  5       Safety -- tobacco screening: not using -- alcohol screening: encouraged cutting back -- no evidence of domestic violence or intimate partner violence.   Cancer Screening -- No age related cancer screening due  Immunizations -- flu vaccine up to date --  TDAP q10 years up to date     Lynnda ChildJessica R Lillan Mccreadie

## 2019-02-05 NOTE — Assessment & Plan Note (Signed)
Advised trying hydroxyzine at night instead of ativan as he is having lingering symptoms during the day. Only needing occasionally currently

## 2019-02-05 NOTE — Assessment & Plan Note (Signed)
Given recurrent elevated readings meets diagnosis. Is motivated to do life style changes so will plan for 4 month trial and reassess.

## 2019-02-05 NOTE — Patient Instructions (Signed)
Your blood pressure high.   High blood pressure increases your risk for heart attack and stroke.    Please check your blood pressure 2-4 times a week.   To check your blood pressure 1) Sit in a quiet and relaxed place for 5 minutes 2) Make sure your feet are flat on the ground 3) Consider checking first thing in the morning   Normal blood pressure is less than 140/90 Ideally you blood pressure should be around 120/80  Other ways you can reduce your blood pressure:  1) Regular exercise -- Try to get 150 minutes (30 minutes, 5 days a week) of moderate to vigorous aerobic excercise -- Examples: brisk walking (2.5 miles per hour), water aerobics, dancing, gardening, tennis, biking slower than 10 miles per hour 2) DASH Diet - low fat meats, more fresh fruits and vegetables, whole grains, low salt 3) Quit smoking if you smoke 4) Loose 5-10% of your body weight   DASH Eating Plan DASH stands for "Dietary Approaches to Stop Hypertension." The DASH eating plan is a healthy eating plan that has been shown to reduce high blood pressure (hypertension). It may also reduce your risk for type 2 diabetes, heart disease, and stroke. The DASH eating plan may also help with weight loss. What are tips for following this plan?  General guidelines  Avoid eating more than 2,300 mg (milligrams) of salt (sodium) a day. If you have hypertension, you may need to reduce your sodium intake to 1,500 mg a day.  Limit alcohol intake to no more than 1 drink a day for nonpregnant women and 2 drinks a day for men. One drink equals 12 oz of beer, 5 oz of wine, or 1 oz of hard liquor.  Work with your health care provider to maintain a healthy body weight or to lose weight. Ask what an ideal weight is for you.  Get at least 30 minutes of exercise that causes your heart to beat faster (aerobic exercise) most days of the week. Activities may include walking, swimming, or biking.  Work with your health care provider  or diet and nutrition specialist (dietitian) to adjust your eating plan to your individual calorie needs. Reading food labels   Check food labels for the amount of sodium per serving. Choose foods with less than 5 percent of the Daily Value of sodium. Generally, foods with less than 300 mg of sodium per serving fit into this eating plan.  To find whole grains, look for the word "whole" as the first word in the ingredient list. Shopping  Buy products labeled as "low-sodium" or "no salt added."  Buy fresh foods. Avoid canned foods and premade or frozen meals. Cooking  Avoid adding salt when cooking. Use salt-free seasonings or herbs instead of table salt or sea salt. Check with your health care provider or pharmacist before using salt substitutes.  Do not fry foods. Cook foods using healthy methods such as baking, boiling, grilling, and broiling instead.  Cook with heart-healthy oils, such as olive, canola, soybean, or sunflower oil. Meal planning  Eat a balanced diet that includes: ? 5 or more servings of fruits and vegetables each day. At each meal, try to fill half of your plate with fruits and vegetables. ? Up to 6-8 servings of whole grains each day. ? Less than 6 oz of lean meat, poultry, or fish each day. A 3-oz serving of meat is about the same size as a deck of cards. One egg equals 1 oz. ?   2 servings of low-fat dairy each day. ? A serving of nuts, seeds, or beans 5 times each week. ? Heart-healthy fats. Healthy fats called Omega-3 fatty acids are found in foods such as flaxseeds and coldwater fish, like sardines, salmon, and mackerel.  Limit how much you eat of the following: ? Canned or prepackaged foods. ? Food that is high in trans fat, such as fried foods. ? Food that is high in saturated fat, such as fatty meat. ? Sweets, desserts, sugary drinks, and other foods with added sugar. ? Full-fat dairy products.  Do not salt foods before eating.  Try to eat at least 2  vegetarian meals each week.  Eat more home-cooked food and less restaurant, buffet, and fast food.  When eating at a restaurant, ask that your food be prepared with less salt or no salt, if possible. What foods are recommended? The items listed may not be a complete list. Talk with your dietitian about what dietary choices are best for you. Grains Whole-grain or whole-wheat bread. Whole-grain or whole-wheat pasta. Brown rice. Oatmeal. Quinoa. Bulgur. Whole-grain and low-sodium cereals. Pita bread. Low-fat, low-sodium crackers. Whole-wheat flour tortillas. Vegetables Fresh or frozen vegetables (raw, steamed, roasted, or grilled). Low-sodium or reduced-sodium tomato and vegetable juice. Low-sodium or reduced-sodium tomato sauce and tomato paste. Low-sodium or reduced-sodium canned vegetables. Fruits All fresh, dried, or frozen fruit. Canned fruit in natural juice (without added sugar). Meat and other protein foods Skinless chicken or turkey. Ground chicken or turkey. Pork with fat trimmed off. Fish and seafood. Egg whites. Dried beans, peas, or lentils. Unsalted nuts, nut butters, and seeds. Unsalted canned beans. Lean cuts of beef with fat trimmed off. Low-sodium, lean deli meat. Dairy Low-fat (1%) or fat-free (skim) milk. Fat-free, low-fat, or reduced-fat cheeses. Nonfat, low-sodium ricotta or cottage cheese. Low-fat or nonfat yogurt. Low-fat, low-sodium cheese. Fats and oils Soft margarine without trans fats. Vegetable oil. Low-fat, reduced-fat, or light mayonnaise and salad dressings (reduced-sodium). Canola, safflower, olive, soybean, and sunflower oils. Avocado. Seasoning and other foods Herbs. Spices. Seasoning mixes without salt. Unsalted popcorn and pretzels. Fat-free sweets. What foods are not recommended? The items listed may not be a complete list. Talk with your dietitian about what dietary choices are best for you. Grains Baked goods made with fat, such as croissants, muffins, or  some breads. Dry pasta or rice meal packs. Vegetables Creamed or fried vegetables. Vegetables in a cheese sauce. Regular canned vegetables (not low-sodium or reduced-sodium). Regular canned tomato sauce and paste (not low-sodium or reduced-sodium). Regular tomato and vegetable juice (not low-sodium or reduced-sodium). Pickles. Olives. Fruits Canned fruit in a light or heavy syrup. Fried fruit. Fruit in cream or butter sauce. Meat and other protein foods Fatty cuts of meat. Ribs. Fried meat. Bacon. Sausage. Bologna and other processed lunch meats. Salami. Fatback. Hotdogs. Bratwurst. Salted nuts and seeds. Canned beans with added salt. Canned or smoked fish. Whole eggs or egg yolks. Chicken or turkey with skin. Dairy Whole or 2% milk, cream, and half-and-half. Whole or full-fat cream cheese. Whole-fat or sweetened yogurt. Full-fat cheese. Nondairy creamers. Whipped toppings. Processed cheese and cheese spreads. Fats and oils Butter. Stick margarine. Lard. Shortening. Ghee. Bacon fat. Tropical oils, such as coconut, palm kernel, or palm oil. Seasoning and other foods Salted popcorn and pretzels. Onion salt, garlic salt, seasoned salt, table salt, and sea salt. Worcestershire sauce. Tartar sauce. Barbecue sauce. Teriyaki sauce. Soy sauce, including reduced-sodium. Steak sauce. Canned and packaged gravies. Fish sauce. Oyster sauce. Cocktail sauce.   Horseradish that you find on the shelf. Ketchup. Mustard. Meat flavorings and tenderizers. Bouillon cubes. Hot sauce and Tabasco sauce. Premade or packaged marinades. Premade or packaged taco seasonings. Relishes. Regular salad dressings. Where to find more information:  National Heart, Lung, and Blood Institute: www.nhlbi.nih.gov  American Heart Association: www.heart.org Summary  The DASH eating plan is a healthy eating plan that has been shown to reduce high blood pressure (hypertension). It may also reduce your risk for type 2 diabetes, heart disease,  and stroke.  With the DASH eating plan, you should limit salt (sodium) intake to 2,300 mg a day. If you have hypertension, you may need to reduce your sodium intake to 1,500 mg a day.  When on the DASH eating plan, aim to eat more fresh fruits and vegetables, whole grains, lean proteins, low-fat dairy, and heart-healthy fats.  Work with your health care provider or diet and nutrition specialist (dietitian) to adjust your eating plan to your individual calorie needs. This information is not intended to replace advice given to you by your health care provider. Make sure you discuss any questions you have with your health care provider. Document Released: 07/19/2011 Document Revised: 07/23/2016 Document Reviewed: 07/23/2016 Elsevier Interactive Patient Education  2019 Elsevier Inc.   

## 2019-02-05 NOTE — Assessment & Plan Note (Signed)
Will screen for diabetes today given prior elevated number and now with HTN

## 2019-02-06 ENCOUNTER — Telehealth: Payer: Self-pay

## 2019-02-06 NOTE — Telephone Encounter (Signed)
Left message for patient to call back  

## 2019-02-06 NOTE — Telephone Encounter (Signed)
Left detailed message on patients phone.

## 2019-02-06 NOTE — Telephone Encounter (Signed)
Pt returned your call. Can leave detailed msg on phone.

## 2019-04-13 DIAGNOSIS — G4733 Obstructive sleep apnea (adult) (pediatric): Secondary | ICD-10-CM | POA: Diagnosis not present

## 2019-04-24 ENCOUNTER — Encounter: Payer: Self-pay | Admitting: Family Medicine

## 2019-04-24 ENCOUNTER — Ambulatory Visit (INDEPENDENT_AMBULATORY_CARE_PROVIDER_SITE_OTHER): Payer: BC Managed Care – PPO | Admitting: Family Medicine

## 2019-04-24 ENCOUNTER — Other Ambulatory Visit: Payer: Self-pay

## 2019-04-24 VITALS — BP 142/92 | HR 66 | Temp 97.3°F | Ht 72.0 in | Wt 279.1 lb

## 2019-04-24 DIAGNOSIS — E669 Obesity, unspecified: Secondary | ICD-10-CM | POA: Diagnosis not present

## 2019-04-24 DIAGNOSIS — I10 Essential (primary) hypertension: Secondary | ICD-10-CM

## 2019-04-24 DIAGNOSIS — R609 Edema, unspecified: Secondary | ICD-10-CM | POA: Insufficient documentation

## 2019-04-24 DIAGNOSIS — M5440 Lumbago with sciatica, unspecified side: Secondary | ICD-10-CM | POA: Diagnosis not present

## 2019-04-24 DIAGNOSIS — M545 Low back pain, unspecified: Secondary | ICD-10-CM | POA: Insufficient documentation

## 2019-04-24 MED ORDER — METHOCARBAMOL 500 MG PO TABS: 500.0000 mg | ORAL_TABLET | Freq: Three times a day (TID) | ORAL | 0 refills | Status: DC | PRN

## 2019-04-24 MED ORDER — HYDROCHLOROTHIAZIDE 25 MG PO TABS: 25.0000 mg | ORAL_TABLET | Freq: Every day | ORAL | 3 refills | Status: DC

## 2019-04-24 NOTE — Progress Notes (Signed)
Subjective:    Patient ID: Carlos Wheeler, male    DOB: 02-02-1981, 38 y.o.   MRN: [id number]  HPI 38 yo pt of Dr Einar Pheasant here with back and leg pain  Also hand swelling   Wt Readings from Last 3 Encounters:  04/24/19 279 lb 1 oz (126.6 kg)  02/05/19 260 lb (117.9 kg)  10/24/18 264 lb 3 oz (119.8 kg)   37.85 kg/m  Thinks he is retaining fluid  Hands feel swollen off and on  Not feet or ankle  Face looks puffy   Cut back on sodium after last PE  This week cut out alcohol   (had at least a drink per day)- feels fine  Drinks water - some coffee No sodas   Taking otc nsaids   Back pain-low  Started 2 weeks ago  No injury known Does cardio at the gym - treadmill  No weight lifting right now  No weakness   Back pain is midline  It radiates down both legs to the knee  Burning-esp in quads /can be sharp Relieved by leaning forward -worse to bend back   Mattress is fairly new    Some tingling in legs   BP Readings from Last 3 Encounters:  04/24/19 (!) 142/92  02/05/19 (!) 148/96  10/24/18 (!) 146/74   Has high bp in the family    Had a mild lumbar curvature on xray in 08  Patient Active Problem List   Diagnosis Date Noted  . Fluid retention 04/24/2019  . Low back pain 04/24/2019  . Elevated random blood glucose level 02/05/2019  . Essential hypertension 02/05/2019  . Increased thirst 11/25/2018  . Anxiety 09/30/2018  . Obesity (BMI 30.0-34.9) 12/04/2016  . Obstructive sleep apnea of adult 08/02/2016   Past Medical History:  Diagnosis Date  . Anxiety   . Asthma   . Sleep apnea    Past Surgical History:  Procedure Laterality Date  . NO PAST SURGERIES     Social History   Tobacco Use  . Smoking status: Never Smoker  . Smokeless tobacco: Never Used  Substance Use Topics  . Alcohol use: Yes    Comment: sometimes daily- 2-3 liquor servings  . Drug use: No   Family History  Problem Relation Age of Onset  . Hyperlipidemia Mother   .  Hypertension Mother   . Heart attack Father 83  . Heart disease Father   . Hypertension Father   . Diabetes Sister   . Hyperlipidemia Sister   . Hypertension Sister   . Diabetes Maternal Grandmother   . Hypertension Maternal Grandmother   . Diabetes Maternal Grandfather   . Hypertension Maternal Grandfather   . Heart attack Maternal Grandfather 70  . Diabetes Paternal Grandmother   . Stroke Paternal Grandmother 18  . Diabetes Paternal Grandfather   . Cancer Neg Hx    Allergies  Allergen Reactions  . Amoxicillin Nausea Only    Other reaction(s): Other (See Comments), Vomiting Causes N/V and GI upset  . Montelukast     Other reaction(s): Confusion (intolerance), Other (See Comments) Causes forgetfulness   . Prednisone    Current Outpatient Medications on File Prior to Visit  Medication Sig Dispense Refill  . albuterol (PROVENTIL HFA;VENTOLIN HFA) 108 (90 Base) MCG/ACT inhaler Inhale 1-2 puffs into the lungs every 6 (six) hours as needed.     . hydrOXYzine (ATARAX/VISTARIL) 50 MG tablet Take 1 tablet (50 mg total) by mouth 3 (three) times daily as needed  for anxiety. 30 tablet 1  . LORazepam (ATIVAN) 1 MG tablet Take 1 tablet (1 mg total) by mouth at bedtime. 30 tablet 0   No current facility-administered medications on file prior to visit.     Review of Systems  Constitutional: Positive for unexpected weight change. Negative for activity change, appetite change, fatigue and fever.  HENT: Negative for congestion, rhinorrhea, sore throat and trouble swallowing.   Eyes: Negative for pain, redness, itching and visual disturbance.  Respiratory: Negative for cough, chest tightness, shortness of breath and wheezing.   Cardiovascular: Negative for chest pain and palpitations.       Feels like he is retaining fluid all over  Gastrointestinal: Negative for abdominal pain, blood in stool, constipation, diarrhea and nausea.  Endocrine: Negative for cold intolerance, heat intolerance,  polydipsia and polyuria.  Genitourinary: Negative for difficulty urinating, dysuria, frequency and urgency.  Musculoskeletal: Positive for back pain. Negative for arthralgias, joint swelling and myalgias.  Skin: Negative for pallor and rash.  Neurological: Negative for dizziness, tremors, weakness, numbness and headaches.  Hematological: Negative for adenopathy. Does not bruise/bleed easily.  Psychiatric/Behavioral: Negative for decreased concentration and dysphoric mood. The patient is not nervous/anxious.        Objective:   Physical Exam Constitutional:      General: He is not in acute distress.    Appearance: He is well-developed. He is obese. He is not ill-appearing or diaphoretic.  HENT:     Head: Normocephalic and atraumatic.  Eyes:     Conjunctiva/sclera: Conjunctivae normal.     Pupils: Pupils are equal, round, and reactive to light.  Neck:     Musculoskeletal: Normal range of motion and neck supple.     Thyroid: No thyromegaly.     Vascular: No carotid bruit or JVD.  Cardiovascular:     Rate and Rhythm: Normal rate and regular rhythm.     Heart sounds: Normal heart sounds. No gallop.   Pulmonary:     Effort: Pulmonary effort is normal. No respiratory distress.     Breath sounds: Normal breath sounds. No wheezing or rales.  Abdominal:     General: Bowel sounds are normal. There is no distension or abdominal bruit.     Palpations: Abdomen is soft. There is no mass.     Tenderness: There is no abdominal tenderness.  Musculoskeletal:     Lumbar back: He exhibits decreased range of motion, tenderness and spasm. He exhibits no bony tenderness, no swelling, no deformity and normal pulse.     Comments: Midline and lumbar muscle tenderness Neg SLR Nl gait Pain to extend spine 10 deg and flex 90 deg  No neuro changes  Spasm noted   Lymphadenopathy:     Cervical: No cervical adenopathy.  Skin:    General: Skin is warm and dry.     Findings: No rash.  Neurological:      Mental Status: He is alert.     Motor: No weakness.     Coordination: Coordination normal.     Gait: Gait normal.     Deep Tendon Reflexes: Reflexes are normal and symmetric. Reflexes normal.  Psychiatric:        Mood and Affect: Mood is anxious.     Comments: Mildly anxious            Assessment & Plan:   Problem List Items Addressed This Visit      Cardiovascular and Mediastinum   Essential hypertension - Primary    Elevated bp  and feeling of fluid retention with wt gain Starting to change habits BP Readings from Last 3 Encounters:  04/24/19 (!) 142/92  02/05/19 (!) 148/96  10/24/18 (!) 146/74   Started on hctz 25 mg  inst to call if problems or side eff Handout given on HTN and dash diet  Enc wt loss  F/u planned for 2 wk for visit and labs       Relevant Medications   hydrochlorothiazide (HYDRODIURIL) 25 MG tablet     Other   Obesity (BMI 30.0-34.9)    Discussed how this problem influences overall health and the risks it imposes  Reviewed plan for weight loss with lower calorie diet (via better food choices and also portion control or program like weight watchers) and exercise building up to or more than 30 minutes 5 days per week including some aerobic activity         Fluid retention    With HTN  No cardiac symptoms Reassuring exam  Handout on DASH diet Enc water intake hctz 25 mg px  F/u 2 wk      Low back pain    Midline and muscular with rad to legs  No neuro changes  Enc low impact exercise /walking  Px methocarbamol Stop nsaid due to fluid retention  Handout given for exercises/stretches Update if not starting to improve in a week or if worsening        Relevant Medications   methocarbamol (ROBAXIN) 500 MG tablet

## 2019-04-24 NOTE — Patient Instructions (Addendum)
Goal for fluids - 64 oz of fluid per day (mostly water)   Avoid excess sodium if you can (look at the [facility] handout)  Work on weight loss with healthy diet and exercise   Start hctz 25 mg each morning  That is a diuretic to lower blood pressure and help fluid overload   For back pain-try the muscle relaxer (methocarbamol) - caution of sedation  Avoid ibuprofen, and naproxen (advil or aleve)- they cause fluid retention   Tylenol is ok   Follow up in approx 2 weeks for a visit and labs

## 2019-04-26 NOTE — Assessment & Plan Note (Signed)
Midline and muscular with rad to legs  No neuro changes  Enc low impact exercise /walking  Px methocarbamol Stop nsaid due to fluid retention  Handout given for exercises/stretches Update if not starting to improve in a week or if worsening

## 2019-04-26 NOTE — Assessment & Plan Note (Signed)
Discussed how this problem influences overall health and the risks it imposes  Reviewed plan for weight loss with lower calorie diet (via better food choices and also portion control or program like weight watchers) and exercise building up to or more than 30 minutes 5 days per week including some aerobic activity    

## 2019-04-26 NOTE — Assessment & Plan Note (Signed)
Elevated bp and feeling of fluid retention with wt gain Starting to change habits BP Readings from Last 3 Encounters:  04/24/19 (!) 142/92  02/05/19 (!) 148/96  10/24/18 (!) 146/74   Started on hctz 25 mg  inst to call if problems or side eff Handout given on HTN and dash diet  Enc wt loss  F/u planned for 2 wk for visit and labs

## 2019-04-26 NOTE — Assessment & Plan Note (Signed)
With HTN  No cardiac symptoms Reassuring exam  Handout on DASH diet Enc water intake hctz 25 mg px  F/u 2 wk

## 2019-05-08 ENCOUNTER — Ambulatory Visit (INDEPENDENT_AMBULATORY_CARE_PROVIDER_SITE_OTHER): Payer: BC Managed Care – PPO | Admitting: Family Medicine

## 2019-05-08 ENCOUNTER — Encounter: Payer: Self-pay | Admitting: Family Medicine

## 2019-05-08 ENCOUNTER — Other Ambulatory Visit: Payer: Self-pay

## 2019-05-08 VITALS — BP 130/85 | HR 65 | Temp 97.0°F | Ht 72.0 in | Wt 278.2 lb

## 2019-05-08 DIAGNOSIS — R609 Edema, unspecified: Secondary | ICD-10-CM

## 2019-05-08 DIAGNOSIS — M5440 Lumbago with sciatica, unspecified side: Secondary | ICD-10-CM | POA: Diagnosis not present

## 2019-05-08 DIAGNOSIS — I10 Essential (primary) hypertension: Secondary | ICD-10-CM

## 2019-05-08 DIAGNOSIS — E66811 Obesity, class 1: Secondary | ICD-10-CM

## 2019-05-08 DIAGNOSIS — E669 Obesity, unspecified: Secondary | ICD-10-CM | POA: Diagnosis not present

## 2019-05-08 DIAGNOSIS — Z87898 Personal history of other specified conditions: Secondary | ICD-10-CM

## 2019-05-08 NOTE — Patient Instructions (Addendum)
Great job with everything you are doing  Stay away from alcohol  Keep working on diet and exercise   Labs today   Blood pressure is much better  Continue the hctz daily   Call us when you want to set up an xray

## 2019-05-08 NOTE — Progress Notes (Signed)
Subjective:    Patient ID: Carlos Wheeler, male    DOB: [birth year], 38 y.o.   MRN: [id number]  HPI Here for f/u of HTN   Wt Readings from Last 3 Encounters:  05/08/19 278 lb 4 oz (126.2 kg)  04/24/19 279 lb 1 oz (126.6 kg)  02/05/19 260 lb (117.9 kg)   37.74 kg/m   Last visit pt was seen with c/o retaining fluid -esp in hands He was cutting back on sodium and alcohol  Also stopped nsaid   Still working on all of that   He used to drink heavily- and is proud to be much better  occ one drink on a weekend -no more than that   He has eliminated fast food and salty food  Is exercising when he can   Also had some low back pain  Px methocarbamol - tried for 3-4 d -made him have crazy dreams    bp was elevated  Started him on hctz 25 mg daily   BP Readings from Last 3 Encounters:  05/08/19 (!) 130/94  04/24/19 (!) 142/92  02/05/19 (!) 148/96   Given info on HTN as well -he did read it  bp improved on 2nd check with large cuff today  BP: 130/85    Still has some hand swelling at night but it is not as bad   Patient Active Problem List   Diagnosis Date Noted  . Fluid retention 04/24/2019  . Low back pain 04/24/2019  . Elevated random blood glucose level 02/05/2019  . Essential hypertension 02/05/2019  . Increased thirst 11/25/2018  . Anxiety 09/30/2018  . Obesity (BMI 30.0-34.9) 12/04/2016  . Obstructive sleep apnea of adult 08/02/2016   Past Medical History:  Diagnosis Date  . Anxiety   . Asthma   . Sleep apnea    Past Surgical History:  Procedure Laterality Date  . NO PAST SURGERIES     Social History   Tobacco Use  . Smoking status: Never Smoker  . Smokeless tobacco: Never Used  Substance Use Topics  . Alcohol use: Yes    Comment: sometimes daily- 2-3 liquor servings  . Drug use: No   Family History  Problem Relation Age of Onset  . Hyperlipidemia Mother   . Hypertension Mother   . Heart attack Father 28  . Heart disease Father   .  Hypertension Father   . Diabetes Sister   . Hyperlipidemia Sister   . Hypertension Sister   . Diabetes Maternal Grandmother   . Hypertension Maternal Grandmother   . Diabetes Maternal Grandfather   . Hypertension Maternal Grandfather   . Heart attack Maternal Grandfather 70  . Diabetes Paternal Grandmother   . Stroke Paternal Grandmother 35  . Diabetes Paternal Grandfather   . Cancer Neg Hx    Allergies  Allergen Reactions  . Amoxicillin Nausea Only    Other reaction(s): Other (See Comments), Vomiting Causes N/V and GI upset  . Montelukast     Other reaction(s): Confusion (intolerance), Other (See Comments) Causes forgetfulness   . Prednisone    Current Outpatient Medications on File Prior to Visit  Medication Sig Dispense Refill  . albuterol (PROVENTIL HFA;VENTOLIN HFA) 108 (90 Base) MCG/ACT inhaler Inhale 1-2 puffs into the lungs every 6 (six) hours as needed.     . hydrochlorothiazide (HYDRODIURIL) 25 MG tablet Take 1 tablet (25 mg total) by mouth daily. 30 tablet 3  . hydrOXYzine (ATARAX/VISTARIL) 50 MG tablet Take 1 tablet (50 mg total) by  mouth 3 (three) times daily as needed for anxiety. 30 tablet 1  . LORazepam (ATIVAN) 1 MG tablet Take 1 tablet (1 mg total) by mouth at bedtime. 30 tablet 0  . methocarbamol (ROBAXIN) 500 MG tablet Take 1 tablet (500 mg total) by mouth every 8 (eight) hours as needed for muscle spasms. Caution of sedation 30 tablet 0   No current facility-administered medications on file prior to visit.      Review of Systems  Constitutional: Negative for activity change, appetite change, fatigue, fever and unexpected weight change.  HENT: Negative for congestion, rhinorrhea, sore throat and trouble swallowing.   Eyes: Negative for pain, redness, itching and visual disturbance.  Respiratory: Negative for cough, chest tightness, shortness of breath and wheezing.   Cardiovascular: Negative for chest pain and palpitations.       Feels tight/swollen in  hands  Otherwise swelling has improved  Gastrointestinal: Negative for abdominal pain, blood in stool, constipation, diarrhea and nausea.  Endocrine: Negative for cold intolerance, heat intolerance, polydipsia and polyuria.  Genitourinary: Negative for difficulty urinating, dysuria, frequency and urgency.  Musculoskeletal: Negative for arthralgias, joint swelling and myalgias.  Skin: Negative for pallor and rash.  Neurological: Negative for dizziness, tremors, weakness, numbness and headaches.  Hematological: Negative for adenopathy. Does not bruise/bleed easily.  Psychiatric/Behavioral: Negative for decreased concentration and dysphoric mood. The patient is not nervous/anxious.        Objective:   Physical Exam Constitutional:      General: He is not in acute distress.    Appearance: He is well-developed. He is obese. He is not ill-appearing or diaphoretic.  HENT:     Head: Normocephalic and atraumatic.  Eyes:     Conjunctiva/sclera: Conjunctivae normal.     Pupils: Pupils are equal, round, and reactive to light.  Neck:     Musculoskeletal: Normal range of motion and neck supple.     Thyroid: No thyromegaly.     Vascular: No carotid bruit or JVD.  Cardiovascular:     Rate and Rhythm: Normal rate and regular rhythm.     Pulses: Normal pulses.     Heart sounds: Normal heart sounds. No gallop.   Pulmonary:     Effort: Pulmonary effort is normal. No respiratory distress.     Breath sounds: Normal breath sounds. No wheezing or rales.  Abdominal:     General: Bowel sounds are normal. There is no distension or abdominal bruit.     Palpations: Abdomen is soft. There is no mass.     Tenderness: There is no abdominal tenderness.  Musculoskeletal:     Right shoulder: He exhibits decreased range of motion.     Right lower leg: No edema.     Left lower leg: No edema.     Comments: No swelling in hands currently  Lymphadenopathy:     Cervical: No cervical adenopathy.  Skin:     General: Skin is warm and dry.     Findings: No rash.  Neurological:     Mental Status: He is alert.     Cranial Nerves: No cranial nerve deficit.     Sensory: No sensory deficit.     Deep Tendon Reflexes: Reflexes are normal and symmetric. Reflexes normal.  Psychiatric:        Mood and Affect: Mood normal.           Assessment & Plan:   Problem List Items Addressed This Visit      Cardiovascular and Mediastinum  Essential hypertension - Primary    bp in fair control at this time  BP Readings from Last 1 Encounters:  05/08/19 130/85  improved much with hctz (also less fluid retention) No changes needed Most recent labs reviewed  Disc lifstyle change with low sodium diet and exercise  Antic guidance discussed       Relevant Orders   Comprehensive metabolic panel (Completed)     Other   Obesity (BMI 30.0-34.9)    Discussed how this problem influences overall health and the risks it imposes  Reviewed plan for weight loss with lower calorie diet (via better food choices and also portion control or program like weight watchers) and exercise building up to or more than 30 minutes 5 days per week including some aerobic activity   He has stopped etoh and heavy processed food intake and is getting back to exercise       Fluid retention    hctz has helped this and his BP He still feels like hands are swollen (mostly at night)  ? poss of joint stiffness or neuropathy symptoms  No swelling noted on exam today      Relevant Orders   Comprehensive metabolic panel (Completed)   Low back pain    This continues The methocarbamol is too sedating to take during the day  Enc him to exercise carefully  He is interested with further w/u later-declined LS xray today due to no time      History of heavy alcohol consumption    This has stopped and he feels much better  Has cut back to a glass of wine on the weekend

## 2019-05-09 LAB — COMPREHENSIVE METABOLIC PANEL
AG Ratio: 2 (calc) (ref 1.0–2.5)
ALT: 41 U/L (ref 9–46)
AST: 83 U/L — ABNORMAL HIGH (ref 10–40)
Albumin: 5 g/dL (ref 3.6–5.1)
Alkaline phosphatase (APISO): 49 U/L (ref 36–130)
BUN/Creatinine Ratio: 11 (calc) (ref 6–22)
BUN: 19 mg/dL (ref 7–25)
CO2: 28 mmol/L (ref 20–32)
Calcium: 10.2 mg/dL (ref 8.6–10.3)
Chloride: 100 mmol/L (ref 98–110)
Creat: 1.79 mg/dL — ABNORMAL HIGH (ref 0.60–1.35)
Globulin: 2.5 g/dL (calc) (ref 1.9–3.7)
Glucose, Bld: 86 mg/dL (ref 65–99)
Potassium: 4.1 mmol/L (ref 3.5–5.3)
Sodium: 139 mmol/L (ref 135–146)
Total Bilirubin: 0.6 mg/dL (ref 0.2–1.2)
Total Protein: 7.5 g/dL (ref 6.1–8.1)

## 2019-05-10 DIAGNOSIS — Z87898 Personal history of other specified conditions: Secondary | ICD-10-CM | POA: Insufficient documentation

## 2019-05-10 NOTE — Assessment & Plan Note (Signed)
bp in fair control at this time  BP Readings from Last 1 Encounters:  05/08/19 130/85  improved much with hctz (also less fluid retention) No changes needed Most recent labs reviewed  Disc lifstyle change with low sodium diet and exercise  Antic guidance discussed

## 2019-05-10 NOTE — Assessment & Plan Note (Signed)
hctz has helped this and his BP He still feels like hands are swollen (mostly at night)  ? poss of joint stiffness or neuropathy symptoms  No swelling noted on exam today

## 2019-05-10 NOTE — Assessment & Plan Note (Signed)
This has stopped and he feels much better  Has cut back to a glass of wine on the weekend

## 2019-05-10 NOTE — Assessment & Plan Note (Signed)
This continues The methocarbamol is too sedating to take during the day  Enc him to exercise carefully  He is interested with further w/u later-declined LS xray today due to no time

## 2019-05-10 NOTE — Assessment & Plan Note (Signed)
Discussed how this problem influences overall health and the risks it imposes  Reviewed plan for weight loss with lower calorie diet (via better food choices and also portion control or program like weight watchers) and exercise building up to or more than 30 minutes 5 days per week including some aerobic activity   He has stopped etoh and heavy processed food intake and is getting back to exercise

## 2019-05-11 ENCOUNTER — Encounter: Payer: Self-pay | Admitting: Primary Care

## 2019-05-11 ENCOUNTER — Other Ambulatory Visit: Payer: Self-pay

## 2019-05-11 ENCOUNTER — Ambulatory Visit (INDEPENDENT_AMBULATORY_CARE_PROVIDER_SITE_OTHER): Payer: BC Managed Care – PPO | Admitting: Primary Care

## 2019-05-11 VITALS — Wt 278.0 lb

## 2019-05-11 DIAGNOSIS — R432 Parageusia: Secondary | ICD-10-CM | POA: Insufficient documentation

## 2019-05-11 DIAGNOSIS — Z20822 Contact with and (suspected) exposure to covid-19: Secondary | ICD-10-CM

## 2019-05-11 DIAGNOSIS — R6889 Other general symptoms and signs: Secondary | ICD-10-CM | POA: Diagnosis not present

## 2019-05-11 DIAGNOSIS — Z20828 Contact with and (suspected) exposure to other viral communicable diseases: Secondary | ICD-10-CM

## 2019-05-11 NOTE — Progress Notes (Signed)
Subjective:    Patient ID: Carlos Wheeler, male    DOB: 1981-01-17, 38 y.o.   MRN: [id number]  HPI  Virtual Visit via Video Note  I connected with Carlos Wheeler on 05/11/19 at 11:20 AM EDT by a video enabled telemedicine application and verified that I am speaking with the correct person using two identifiers.  Location: Patient: Work Provider: Office   I discussed the limitations of evaluation and management by telemedicine and the availability of in person appointments. The patient expressed understanding and agreed to proceed.  History of Present Illness:  Carlos Wheeler is a 38 year old male with a history of OSA, hypertension, anxiety, alcohol abuse who presents today with a chief complaint of diarrhea and loss of taste.  Two evenings ago he began experience diarrhea which lasted for a few hours. Yesterday he noticed a decrease in taste sensation when sipping coffee, couldn't taste the coffee which seemed more like hot water. THis morning he was brushing his teeth and couldn't taste the toothpaste. He has noticed some fatigue, otherwise feels pretty well. He denies abdominal pain, nausea, cough, rhinorrhea,   He believes that he could have been exposed to Covid as he recently started going back to the gym, has also been around two people who were exposed to another person with Covid-19. Those individuals tested negative.   Observations/Objective:  Alert and oriented. Appears well, not sickly. No distress. Speaking in complete sentences. No cough.  Assessment and Plan:  Symptoms are suspicious for Covid-19. He appears well, no distress. Discussed that he should leave work now and get tested.  Orders placed for testing. He will go to [facility]. Also discussed standard quarantine recommendations, he verbalized understanding. Work note provided.   Follow Up Instructions:  [facility] [place].  Quarantine yourself  at home as discussed until we have your test result.   It was a pleasure meeting you! Mayra ReelKate Devereaux Grayson, NP-C    I discussed the assessment and treatment plan with the patient. The patient was provided an opportunity to ask questions and all were answered. The patient agreed with the plan and demonstrated an understanding of the instructions.   The patient was advised to call back or seek an in-person evaluation if the symptoms worsen or if the condition fails to improve as anticipated.    Doreene NestKatherine K Rhylin Venters, NP    Review of Systems  Constitutional: Positive for fatigue. Negative for fever.  HENT:       Loss of taste  Respiratory: Negative for shortness of breath.   Cardiovascular: Negative for chest pain.  Gastrointestinal: Positive for diarrhea.       Past Medical History:  Diagnosis Date  . Anxiety   . Asthma   . Sleep apnea      Social History   Socioeconomic History  . Marital status: Married    Spouse name: [name]  . Number of children: 2  . Years of education: MBA  . Highest education level: Not on file  Occupational History  . Not on file  Social Needs  . Financial resource strain: Not hard at all  . Food insecurity    Worry: Not on file    Inability: Not on file  . Transportation needs    Medical: Not on file    Non-medical: Not on file  Tobacco Use  . Smoking status: Never Smoker  . Smokeless tobacco: Never Used  Substance and Sexual Activity  .  Alcohol use: Yes    Comment: sometimes daily- 2-3 liquor servings  . Drug use: No  . Sexual activity: Yes    Birth control/protection: Surgical  Lifestyle  . Physical activity    Days per week: Not on file    Minutes per session: Not on file  . Stress: Not on file  Relationships  . Social Musician on phone: Not on file    Gets together: Not on file    Attends religious service: Not on file    Active member of club or organization: Not on file    Attends meetings of clubs or  organizations: Not on file    Relationship status: Not on file  . Intimate partner violence    Fear of current or ex partner: Not on file    Emotionally abused: Not on file    Physically abused: Not on file    Forced sexual activity: Not on file  Other Topics Concern  . Not on file  Social History Narrative   09/30/18   Lives with wife and 2 children - twin boy (Jawara) and girl ([name]) - born 2017   Enjoys: drag race, working on cars - trying to start a business   Work: Acupuncturist   Diet: eats good - grilled, avoids fried foods, carbs; does not eat very frequent   Exercise: does cardio every week    Past Surgical History:  Procedure Laterality Date  . NO PAST SURGERIES      Family History  Problem Relation Age of Onset  . Hyperlipidemia Mother   . Hypertension Mother   . Heart attack Father 79  . Heart disease Father   . Hypertension Father   . Diabetes Sister   . Hyperlipidemia Sister   . Hypertension Sister   . Diabetes Maternal Grandmother   . Hypertension Maternal Grandmother   . Diabetes Maternal Grandfather   . Hypertension Maternal Grandfather   . Heart attack Maternal Grandfather 70  . Diabetes Paternal Grandmother   . Stroke Paternal Grandmother 67  . Diabetes Paternal Grandfather   . Cancer Neg Hx     Allergies  Allergen Reactions  . Amoxicillin Nausea Only    Other reaction(s): Other (See Comments), Vomiting Causes N/V and GI upset  . Montelukast     Other reaction(s): Confusion (intolerance), Other (See Comments) Causes forgetfulness   . Prednisone     Current Outpatient Medications on File Prior to Visit  Medication Sig Dispense Refill  . albuterol (PROVENTIL HFA;VENTOLIN HFA) 108 (90 Base) MCG/ACT inhaler Inhale 1-2 puffs into the lungs every 6 (six) hours as needed.     . hydrochlorothiazide (HYDRODIURIL) 25 MG tablet Take 1 tablet (25 mg total) by mouth daily. 30 tablet 3  . hydrOXYzine (ATARAX/VISTARIL) 50 MG tablet Take 1  tablet (50 mg total) by mouth 3 (three) times daily as needed for anxiety. 30 tablet 1  . LORazepam (ATIVAN) 1 MG tablet Take 1 tablet (1 mg total) by mouth at bedtime. 30 tablet 0  . methocarbamol (ROBAXIN) 500 MG tablet Take 1 tablet (500 mg total) by mouth every 8 (eight) hours as needed for muscle spasms. Caution of sedation (Patient not taking: Reported on 05/11/2019) 30 tablet 0   No current facility-administered medications on file prior to visit.     Wt 278 lb (126.1 kg)   BMI 37.70 kg/m    Objective:   Physical Exam  Constitutional: He is oriented to person, place, and time.  He appears well-nourished.  Respiratory: Effort normal. No respiratory distress.  No cough  Neurological: He is alert and oriented to person, place, and time.  Psychiatric: He has a normal mood and affect.           Assessment & Plan:

## 2019-05-11 NOTE — Patient Instructions (Signed)
Go to the [facility] for Covid-19 testing.  Quarantine yourself at home as discussed until we have your test result.   It was a pleasure meeting you! Allie Bossier, NP-C

## 2019-05-11 NOTE — Assessment & Plan Note (Signed)
Also with several episodes of diarrhea. Symptoms are suspicious for Covid-19. He appears well, no distress. Discussed that he should leave work now and get tested.  Orders placed for testing. He will go to [facility]. Also discussed standard quarantine recommendations, he verbalized understanding. Work note provided.

## 2019-05-12 ENCOUNTER — Encounter: Payer: Self-pay | Admitting: Primary Care

## 2019-05-12 ENCOUNTER — Other Ambulatory Visit: Payer: Self-pay | Admitting: *Deleted

## 2019-05-12 MED ORDER — LISINOPRIL 10 MG PO TABS: 10.0000 mg | ORAL_TABLET | Freq: Every day | ORAL | 3 refills | Status: DC

## 2019-05-12 MED ORDER — FUROSEMIDE 20 MG PO TABS: 10.0000 mg | ORAL_TABLET | Freq: Every day | ORAL | 3 refills | Status: DC

## 2019-05-12 NOTE — Telephone Encounter (Signed)
Pt notified of lab results and Dr. Marliss Coots comments and instructions, f/u lab and appt with Dr. Einar Pheasant scheduled    Will route back to Dr. Glori Bickers because I do not see a lasix 10 mg dose at all in Epic, ?? If she wants pt to take 1/2 tab of 20 or if she wants pt on 20 mg, please advise   [facility]

## 2019-05-12 NOTE — Telephone Encounter (Signed)
-----   Message from Abner Greenspan, MD sent at 05/10/2019 10:39 AM EDT ----- His cr (kidney number) is up , so the hctz (despite working well for his HTN and swelling) is working kidney too hard  Also one liver test is high -no doubt from his past alcohol intake (so good he is no longer drinking) and we will need to watch this  I want to try a different BP medicine lisinopril in combination with a different diuretic that is much easier on the kidney  (lisinopril with lasix)  Stop hctz Start lisinopril 10 mg daily #30 3 ref Lasix 10 mg daily #30 3 ref Both in am I want him to drink more water also for kidney health  Continue to abstain from etoh  Re check labs in about 2 weeks F/u with Dr Einar Pheasant when she returns

## 2019-05-13 ENCOUNTER — Telehealth: Payer: Self-pay | Admitting: Family Medicine

## 2019-05-13 LAB — NOVEL CORONAVIRUS, NAA: SARS-CoV-2, NAA: NOT DETECTED

## 2019-05-13 NOTE — Telephone Encounter (Signed)
Also please see mychart message. Patient was seen by Allie Bossier for this.

## 2019-05-13 NOTE — Telephone Encounter (Signed)
Work note provided. See my chart messages.

## 2019-05-13 NOTE — Telephone Encounter (Signed)
Best number [phone]  Pt received his covid results today.  It  Was neg. He wanted to know if he could go back to work, does he need a work note.  Pt didn't know what the protocol was for this.  He has not called his work yet.   Pt still has some symptom of loss of taste.  He is improving everyday  No fever

## 2019-05-15 DIAGNOSIS — N179 Acute kidney failure, unspecified: Secondary | ICD-10-CM | POA: Diagnosis not present

## 2019-05-15 DIAGNOSIS — R0789 Other chest pain: Secondary | ICD-10-CM | POA: Diagnosis not present

## 2019-05-16 ENCOUNTER — Emergency Department (HOSPITAL_COMMUNITY)
Admission: EM | Admit: 2019-05-16 | Discharge: 2019-05-16 | Disposition: A | Discharge status: Home / Self Care | Payer: BC Managed Care – PPO | Attending: Emergency Medicine | Admitting: Emergency Medicine

## 2019-05-16 ENCOUNTER — Encounter (HOSPITAL_COMMUNITY): Payer: Self-pay | Admitting: *Deleted

## 2019-05-16 ENCOUNTER — Emergency Department (HOSPITAL_COMMUNITY): Payer: BC Managed Care – PPO

## 2019-05-16 ENCOUNTER — Other Ambulatory Visit: Payer: Self-pay

## 2019-05-16 DIAGNOSIS — Z8709 Personal history of other diseases of the respiratory system: Secondary | ICD-10-CM | POA: Insufficient documentation

## 2019-05-16 DIAGNOSIS — Z79899 Other long term (current) drug therapy: Secondary | ICD-10-CM | POA: Insufficient documentation

## 2019-05-16 DIAGNOSIS — I10 Essential (primary) hypertension: Secondary | ICD-10-CM | POA: Diagnosis not present

## 2019-05-16 DIAGNOSIS — R600 Localized edema: Secondary | ICD-10-CM | POA: Diagnosis not present

## 2019-05-16 DIAGNOSIS — R5383 Other fatigue: Secondary | ICD-10-CM | POA: Diagnosis not present

## 2019-05-16 DIAGNOSIS — N179 Acute kidney failure, unspecified: Secondary | ICD-10-CM | POA: Diagnosis not present

## 2019-05-16 DIAGNOSIS — R0789 Other chest pain: Secondary | ICD-10-CM | POA: Diagnosis not present

## 2019-05-16 HISTORY — DX: Gastro-esophageal reflux disease without esophagitis: K21.9

## 2019-05-16 LAB — BASIC METABOLIC PANEL
Anion gap: 10 (ref 5–15)
BUN: 24 mg/dL — ABNORMAL HIGH (ref 6–20)
CO2: 26 mmol/L (ref 22–32)
Calcium: 9.6 mg/dL (ref 8.9–10.3)
Chloride: 102 mmol/L (ref 98–111)
Creatinine, Ser: 1.86 mg/dL — ABNORMAL HIGH (ref 0.61–1.24)
GFR calc Af Amer: 53 mL/min — ABNORMAL LOW (ref >60)
GFR calc non Af Amer: 46 mL/min — ABNORMAL LOW (ref >60)
Glucose, Bld: 90 mg/dL (ref 70–99)
Potassium: 4.5 mmol/L (ref 3.5–5.1)
Sodium: 138 mmol/L (ref 135–145)

## 2019-05-16 LAB — HEPATIC FUNCTION PANEL
ALT: 54 U/L — ABNORMAL HIGH (ref 0–44)
AST: 105 U/L — ABNORMAL HIGH (ref 15–41)
Albumin: 4.7 g/dL (ref 3.5–5.0)
Alkaline Phosphatase: 48 U/L (ref 38–126)
Bilirubin, Direct: 0.1 mg/dL (ref 0.0–0.2)
Indirect Bilirubin: 0.5 mg/dL (ref 0.3–0.9)
Total Bilirubin: 0.6 mg/dL (ref 0.3–1.2)
Total Protein: 7.6 g/dL (ref 6.5–8.1)

## 2019-05-16 LAB — CREATININE, URINE, RANDOM: Creatinine, Urine: 219.43 mg/dL

## 2019-05-16 LAB — CBC
HCT: 42.5 % (ref 39.0–52.0)
Hemoglobin: 13.4 g/dL (ref 13.0–17.0)
MCH: 29.5 pg (ref 26.0–34.0)
MCHC: 31.5 g/dL (ref 30.0–36.0)
MCV: 93.6 fL (ref 80.0–100.0)
Platelets: 196 10*3/uL (ref 150–400)
RBC: 4.54 MIL/uL (ref 4.22–5.81)
RDW: 15 % (ref 11.5–15.5)
WBC: 4.8 10*3/uL (ref 4.0–10.5)
nRBC: 0 % (ref 0.0–0.2)

## 2019-05-16 LAB — BRAIN NATRIURETIC PEPTIDE: B Natriuretic Peptide: 6.5 pg/mL (ref 0.0–100.0)

## 2019-05-16 LAB — SODIUM, URINE, RANDOM: Sodium, Ur: 177 mmol/L

## 2019-05-16 LAB — TROPONIN I (HIGH SENSITIVITY): Troponin I (High Sensitivity): 3 ng/L (ref <18)

## 2019-05-16 MED ORDER — SODIUM CHLORIDE 0.9 % IV BOLUS
500.0000 mL | Freq: Once | INTRAVENOUS | Status: AC
  Administered 2019-05-16: 15:00:00 500 mL via INTRAVENOUS

## 2019-05-16 MED ORDER — SODIUM CHLORIDE 0.9% FLUSH: 3.0000 mL | Freq: Once | INTRAVENOUS | Status: DC

## 2019-05-16 NOTE — Discharge Instructions (Signed)
Please continue your home medications as previously prescribed. Return to the ED if you start to have worsening symptoms, develop chest pain, shortness of breath, increased swelling in your legs or arms.

## 2019-05-16 NOTE — ED Notes (Signed)
Pt ambulatory to the restroom without difficulty. Gait steady and even.  

## 2019-05-16 NOTE — ED Triage Notes (Signed)
Pt states was placed on diuretic several weeks ago and his md called on thurs to stop taking  meds d/t elevated liver and kidney labs.  States face is and hands are now swelling.  Also c/o back pain, chest tightness.

## 2019-05-16 NOTE — ED Provider Notes (Signed)
[name] EMERGENCY DEPARTMENT Provider Note   CSN: [id number] Arrival date & time: 05/16/19  1226     History   Chief Complaint Chief Complaint  Patient presents with  . Fatigue  . Facial Swelling    HPI Carlos Wheeler is a 38 y.o. male with a past medical history of GERD, sleep apnea, who presents to ED for back pain. Patient states his history is as follows: He has a history of heavy alcohol abuse until 1 month ago when he states he "quit cold [place]." States that he went through a few withdrawal symptoms but recovered without difficulty.  He had elevated blood pressure readings in his PCP office for the past several months but he knew this was due to his lifestyle, diet and alcohol use.  His usual PCP did not place him on any antihypertensives.  States that he recently went back to the gym, decreased salt intake and stopped drinking alcohol.  He saw another provider in his practice about 2 weeks ago for back pain.  He was concerned that he pulled a muscle in his back from his work.  He had been complaining of intermittent hand swelling so his provider was concerned that he was retaining fluid.  Patient states that "I thought the same thing to but I ain't no doctor."  He was started on HCTZ 25 mg.  He was taking this up until about 3 to 4 days ago.  States that "I've never felt as bad as I did on that medicine.  I have never had high blood pressure I knew it was because of my lifestyle."  He was told several days ago to stop taking the HCTZ and instead switch to lisinopril and Lasix due to his elevation creatinine of 1.7 and AST of 83.  He did not want to take these medications because he did not feel like he needed them.  His only complaint today is back pain.  Denies chest pain, shortness of breath, leg swelling.  He feels like he does have facial swelling due to his sinus issues.     HPI  Past Medical History:  Diagnosis Date  . Anxiety   . Asthma   . GERD  (gastroesophageal reflux disease)   . Sleep apnea     Patient Active Problem List   Diagnosis Date Noted  . Loss of taste 05/11/2019  . History of heavy alcohol consumption 05/10/2019  . Fluid retention 04/24/2019  . Low back pain 04/24/2019  . Elevated random blood glucose level 02/05/2019  . Essential hypertension 02/05/2019  . Increased thirst 11/25/2018  . Anxiety 09/30/2018  . Obesity (BMI 30.0-34.9) 12/04/2016  . Obstructive sleep apnea of adult 08/02/2016    Past Surgical History:  Procedure Laterality Date  . NO PAST SURGERIES          Home Medications    Prior to Admission medications   Medication Sig Start Date End Date Taking? Authorizing Provider  albuterol (PROVENTIL HFA;VENTOLIN HFA) 108 (90 Base) MCG/ACT inhaler Inhale 1-2 puffs into the lungs every 6 (six) hours as needed.     [provider]  furosemide (LASIX) 20 MG tablet Take 0.5 tablets (10 mg total) by mouth daily. 05/12/19   Tower, Audrie GallusMarne A, MD  hydrOXYzine (ATARAX/VISTARIL) 50 MG tablet Take 1 tablet (50 mg total) by mouth 3 (three) times daily as needed for anxiety. 02/05/19   Lynnda Childody, Jessica R, MD  lisinopril (ZESTRIL) 10 MG tablet Take 1 tablet (10 mg  total) by mouth daily. 05/12/19   Tower, Wynelle Fanny, MD  LORazepam (ATIVAN) 1 MG tablet Take 1 tablet (1 mg total) by mouth at bedtime. 09/30/18   [name], MD  methocarbamol (ROBAXIN) 500 MG tablet Take 1 tablet (500 mg total) by mouth every 8 (eight) hours as needed for muscle spasms. Caution of sedation Patient not taking: Reported on 05/11/2019 04/24/19   Abner Greenspan, MD    Family History Family History  Problem Relation Age of Onset  . Hyperlipidemia Mother   . Hypertension Mother   . Heart attack Father 16  . Heart disease Father   . Hypertension Father   . Diabetes Sister   . Hyperlipidemia Sister   . Hypertension Sister   . Diabetes Maternal Grandmother   . Hypertension Maternal Grandmother   . Diabetes Maternal Grandfather    . Hypertension Maternal Grandfather   . Heart attack Maternal Grandfather 70  . Diabetes Paternal Grandmother   . Stroke Paternal Grandmother 56  . Diabetes Paternal Grandfather   . Cancer Neg Hx     Social History Social History   Tobacco Use  . Smoking status: Never Smoker  . Smokeless tobacco: Never Used  Substance Use Topics  . Alcohol use: Yes    Comment: sometimes daily- 2-3 liquor servings  . Drug use: No     Allergies   Amoxicillin, Montelukast, and Prednisone   Review of Systems Review of Systems  Constitutional: Negative for appetite change, chills and fever.  HENT: Positive for facial swelling and sinus pressure. Negative for ear pain, rhinorrhea, sneezing and sore throat.   Eyes: Negative for photophobia and visual disturbance.  Respiratory: Negative for cough, chest tightness, shortness of breath and wheezing.   Cardiovascular: Negative for chest pain and palpitations.  Gastrointestinal: Negative for abdominal pain, blood in stool, constipation, diarrhea, nausea and vomiting.  Genitourinary: Negative for dysuria, hematuria and urgency.  Musculoskeletal: Positive for back pain. Negative for myalgias.  Skin: Negative for rash.  Neurological: Negative for dizziness, weakness and light-headedness.     Physical Exam Updated Vital Signs BP (!) 145/96   Pulse 64   Temp 98.7 F (37.1 C) (Oral)   Resp 14   Ht 6' (1.829 m)   Wt 126.1 kg   SpO2 99%   BMI 37.70 kg/m   Physical Exam Vitals signs and nursing note reviewed.  Constitutional:      General: He is not in acute distress.    Appearance: He is well-developed.  HENT:     Head: Normocephalic and atraumatic.     Nose:     Right Sinus: Maxillary sinus tenderness present.     Left Sinus: Maxillary sinus tenderness present.  Eyes:     General: No scleral icterus.       Left eye: No discharge.     Conjunctiva/sclera: Conjunctivae normal.  Neck:     Musculoskeletal: Normal range of motion and neck  supple.  Cardiovascular:     Rate and Rhythm: Normal rate and regular rhythm.     Heart sounds: Normal heart sounds. No murmur. No friction rub. No gallop.   Pulmonary:     Effort: Pulmonary effort is normal. No respiratory distress.     Breath sounds: Normal breath sounds.  Abdominal:     General: Bowel sounds are normal. There is no distension.     Palpations: Abdomen is soft.     Tenderness: There is no abdominal tenderness. There is no guarding.  Musculoskeletal: Normal  range of motion.     Right lower leg: No edema.     Left lower leg: No edema.  Skin:    General: Skin is warm and dry.     Findings: No rash.  Neurological:     Mental Status: He is alert.     Motor: No abnormal muscle tone.     Coordination: Coordination normal.      ED Treatments / Results  Labs (all labs ordered are listed, but only abnormal results are displayed) Labs Reviewed  BASIC METABOLIC PANEL - Abnormal; Notable for the following components:      Result Value   BUN 24 (*)    Creatinine, Ser 1.86 (*)    GFR calc non Af Amer 46 (*)    GFR calc Af Amer 53 (*)    All other components within normal limits  HEPATIC FUNCTION PANEL - Abnormal; Notable for the following components:   AST 105 (*)    ALT 54 (*)    All other components within normal limits  CBC  BRAIN NATRIURETIC PEPTIDE  SODIUM, URINE, RANDOM  CREATININE, URINE, RANDOM  TROPONIN I (HIGH SENSITIVITY)    EKG EKG Interpretation  Date/Time:  Saturday May 16 2019 12:37:25 EDT Ventricular Rate:  73 PR Interval:  176 QRS Duration: 84 QT Interval:  366 QTC Calculation: 403 R Axis:   47 Text Interpretation:  Normal sinus rhythm Nonspecific T wave abnormality Abnormal ECG No STEMI  Confirmed by Alvester Chou [phone]) on 05/16/2019 1:45:07 PM   Radiology Dg Chest Portable 1 View  Result Date: 05/16/2019 CLINICAL DATA:  Chest tightness EXAM: PORTABLE CHEST 1 VIEW COMPARISON:  10/14/2016 chest radiograph. FINDINGS: Stable  cardiomediastinal silhouette with top-normal heart size. No pneumothorax. No pleural effusion. Lungs appear clear, with no acute consolidative airspace disease and no pulmonary edema. IMPRESSION: No active disease. Electronically Signed   By: Delbert Phenix M.D.   On: 05/16/2019 13:36    Procedures Procedures (including critical care time)  Medications Ordered in ED Medications  sodium chloride flush (NS) 0.9 % injection 3 mL (has no administration in time range)  sodium chloride 0.9 % bolus 500 mL (0 mLs Intravenous Stopped 05/16/19 1631)     Initial Impression / Assessment and Plan / ED Course  I have reviewed the triage vital signs and the nursing notes.  Pertinent labs & imaging results that were available during my care of the patient were reviewed by me and considered in my medical decision making (see chart for details).  Clinical Course as of May 15 1653  Sat May 16, 2019  1323 FROM PCP NOTE 9/27: His cr (kidney number) is up , so the hctz (despite working well for his HTN and swelling) is working kidney too hard  Also one liver test is high -no doubt from his past alcohol intake (so good he is no longer drinking) and we will need to watch this  I want to try a different BP medicine lisinopril in combination with a different diuretic that is much easier on the kidney (lisinopril with lasix)  Stop hctz  Start lisinopril 10 mg daily #30 3 ref  Lasix 10 mg daily #30 3 ref  Both in am  I want him to drink more water also for kidney health  Continue to abstain from etoh  Re check labs in about 2 weeks    [HK]  1432 Creatinine(!): 1.86 [HK]    Clinical Course User Index [HK] Idelle Leech, Mykelti Goldenstein, PA-C  38 year old male with past medical history of prior alcohol abuse, hypertension presents to ED for back pain.  Patient was initially prescribed HCTZ by a provider at his PCPs office 3 weeks ago.  He has been taking this but states that he did not feel well while taking it.  He  followed up earlier this week, found to have a creatinine of 1.7, AST in the 80s.  He was told to discontinue HCTZ and start taking 10 mg of Lasix and 10 mg of lisinopril.  He has not taken this or any other medications since he was told to switch.  He believes that his hand swelling is due to the work that he does.  He was told in the past that his high blood pressure was due to his lifestyle and alcohol use.  He denies any chest pain, shortness of breath.  No lower extremity edema noted on my exam.  He remains ambulatory.  Vital signs are within normal limits, blood pressure in the 140s systolic.  Troponin unremarkable.  EKG shows normal sinus rhythm.  BMP with a creatinine of 1.86, LFTs slightly elevated, AST 105 and ALT 54.  Patient was given half a liter of fluids with some improvement in his symptoms.  Feel that his kidney function could be declining due to his elevated blood pressure readings in the past.  I did tell him that he needed to take the medications that were prescribed by his PCP until he speaks to his PCP about any other changes.  Patient agreeable to the plan.  We will have him return for worsening symptoms.  Patient is hemodynamically stable, in NAD, and able to ambulate in the ED. Evaluation does not show pathology that would require ongoing emergent intervention or inpatient treatment. I explained the diagnosis to the patient. Pain has been managed and has no complaints prior to discharge. Patient is comfortable with above plan and is stable for discharge at this time. All questions were answered prior to disposition. Strict return precautions for returning to the ED were discussed. Encouraged follow up with PCP.   An After Visit Summary was printed and given to the patient.   Portions of this note were generated with Scientist, clinical (histocompatibility and immunogenetics). Dictation errors may occur despite best attempts at proofreading.   Final Clinical Impressions(s) / ED Diagnoses   Final diagnoses:  AKI  (acute kidney injury) [facility])    ED Discharge Orders    None       Dietrich Pates, PA-C 05/16/19 1654    Terald Sleeper, MD 05/16/19 2358

## 2019-05-16 NOTE — ED Notes (Signed)
Patient verbalizes understanding of discharge instructions. Opportunity for questioning and answers were provided. Pt discharged from ED. 

## 2019-05-18 ENCOUNTER — Encounter: Payer: Self-pay | Admitting: Family Medicine

## 2019-05-18 NOTE — Telephone Encounter (Signed)
DR Glori Bickers, also patient has an appointment with Dr Lorelei Pont on 05/20/2019 for back pain. Let me know if this is not appropriate. Patient also has follow up with Dr Einar Pheasant on 06/10/2019.

## 2019-05-19 ENCOUNTER — Other Ambulatory Visit: Payer: Self-pay | Admitting: Primary Care

## 2019-05-19 DIAGNOSIS — N289 Disorder of kidney and ureter, unspecified: Secondary | ICD-10-CM

## 2019-05-19 DIAGNOSIS — R7989 Other specified abnormal findings of blood chemistry: Secondary | ICD-10-CM

## 2019-05-20 ENCOUNTER — Ambulatory Visit (INDEPENDENT_AMBULATORY_CARE_PROVIDER_SITE_OTHER): Payer: BC Managed Care – PPO | Admitting: Family Medicine

## 2019-05-20 ENCOUNTER — Other Ambulatory Visit: Payer: Self-pay

## 2019-05-20 ENCOUNTER — Encounter: Payer: Self-pay | Admitting: Family Medicine

## 2019-05-20 VITALS — BP 118/72 | HR 65 | Temp 97.7°F | Ht 72.0 in | Wt 284.5 lb

## 2019-05-20 DIAGNOSIS — N179 Acute kidney failure, unspecified: Secondary | ICD-10-CM

## 2019-05-20 DIAGNOSIS — M549 Dorsalgia, unspecified: Secondary | ICD-10-CM

## 2019-05-20 DIAGNOSIS — R7989 Other specified abnormal findings of blood chemistry: Secondary | ICD-10-CM | POA: Diagnosis not present

## 2019-05-20 MED ORDER — METHYLPREDNISOLONE 4 MG PO TBPK: ORAL_TABLET | ORAL | 0 refills | Status: DC

## 2019-05-20 NOTE — Progress Notes (Signed)
Carlos Pro T. Mischele Detter, MD Primary Care and Sports Medicine [facility] [address] [place], [address] Phone: [phone]  FAX: [phone]  Carlos Wheeler - 38 y.o. male  MRN [id number]  Date of Birth: [birth year]  Visit Date: 05/20/2019  PCP: [name], MD  Referred by: [name], MD  Chief Complaint  Patient presents with  . Back Pain   Subjective:   Carlos Wheeler is a 38 y.o. very pleasant male patient who presents with the following: Back Pain  ongoing for approximately: He also, about 3 weeks dilatation. The patient has had back pain before. The back pain is localized into the lumbar spine area. They also describe no radiculopathy.  Ongoing back pain:  He also recently stopped drinking abruptly, and he also had some increase in his LFTs as well as his creatinine.  Today he is normotensive in even slightly low in his blood pressure.  He had been started on several different blood pressure medicines.  Increased LFT Repeat LFT's Recently.  Stopped - rare wine  Lab Results  Component Value Date   ALT 54 (H) 05/16/2019   AST 105 (H) 05/16/2019   ALKPHOS 48 05/16/2019   BILITOT 0.6 05/16/2019     3 weeks bkack has been hurting and daily will alleviate tylenol or ibuprofen.  Gets tight that legs will burn and on gire. Burning sensation going down.    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   Lab Results  Component Value Date   CREATININE 1.86 (H) 05/16/2019    Recently started on diuretics  No numbness or tingling. No bowel or bladder incontinence. No focal weakness. Prior interventions: none Physical therapy: No Chiropractic manipulations: No Acupuncture: No Osteopathic manipulation: No Heat or cold: Minimal effect  Past Medical History, Surgical History, Family History, Medications, Allergies have been reviewed and updated if relevant.  Patient Active Problem List   Diagnosis  Date Noted  . Loss of taste 05/11/2019  . History of heavy alcohol consumption 05/10/2019  . Fluid retention 04/24/2019  . Low back pain 04/24/2019  . Elevated random blood glucose level 02/05/2019  . Essential hypertension 02/05/2019  . Increased thirst 11/25/2018  . Anxiety 09/30/2018  . Obesity (BMI 30.0-34.9) 12/04/2016  . Obstructive sleep apnea of adult 08/02/2016    Past Medical History:  Diagnosis Date  . Anxiety   . Asthma   . GERD (gastroesophageal reflux disease)   . Sleep apnea     Past Surgical History:  Procedure Laterality Date  . NO PAST SURGERIES      Social History   Socioeconomic History  . Marital status: Married    Spouse name: [place]  . Number of children: 2  . Years of education: MBA  . Highest education level: Not on file  Occupational History  . Not on file  Social Needs  . Financial resource strain: Not hard at all  . Food insecurity    Worry: Not on file    Inability: Not on file  . Transportation needs    Medical: Not on file    Non-medical: Not on file  Tobacco Use  . Smoking status: Never Smoker  . Smokeless tobacco: Never Used  Substance and Sexual Activity  . Alcohol use: Yes    Comment: sometimes daily- 2-3 liquor servings  . Drug use: No  . Sexual activity: Yes    Birth control/protection: Surgical  Lifestyle  . Physical  activity    Days per week: Not on file    Minutes per session: Not on file  . Stress: Not on file  Relationships  . Social Herbalist on phone: Not on file    Gets together: Not on file    Attends religious service: Not on file    Active member of club or organization: Not on file    Attends meetings of clubs or organizations: Not on file    Relationship status: Not on file  . Intimate partner violence    Fear of current or ex partner: Not on file    Emotionally abused: Not on file    Physically abused: Not on file    Forced sexual activity: Not on file  Other Topics Concern  . Not  on file  Social History Narrative   09/30/18   Lives with wife and 2 children - twin boy (Lottie) and girl ([name]) - born 2017   Enjoys: drag race, working on cars - trying to start a business   Work: Art gallery manager   Diet: eats good - grilled, avoids fried foods, carbs; does not eat very frequent   Exercise: does cardio every week    Family History  Problem Relation Age of Onset  . Hyperlipidemia Mother   . Hypertension Mother   . Heart attack Father 61  . Heart disease Father   . Hypertension Father   . Diabetes Sister   . Hyperlipidemia Sister   . Hypertension Sister   . Diabetes Maternal Grandmother   . Hypertension Maternal Grandmother   . Diabetes Maternal Grandfather   . Hypertension Maternal Grandfather   . Heart attack Maternal Grandfather 70  . Diabetes Paternal Grandmother   . Stroke Paternal Grandmother 28  . Diabetes Paternal Grandfather   . Cancer Neg Hx     Allergies  Allergen Reactions  . Amoxicillin Nausea Only    Other reaction(s): Other (See Comments), Vomiting Causes N/V and GI upset  . Montelukast     Other reaction(s): Confusion (intolerance), Other (See Comments) Causes forgetfulness   . Prednisone     Medication list reviewed and updated in full in [place].  GEN: No fevers, chills. Nontoxic. Primarily MSK c/o today. MSK: Detailed in the HPI GI: tolerating PO intake without difficulty Neuro: As above  Otherwise the pertinent positives of the ROS are noted above.    Objective:   Blood pressure 118/72, pulse 65, temperature 97.7 F (36.5 C), temperature source Temporal, height 6' (1.829 m), weight 284 lb 8 oz (129 kg), SpO2 94 %.  Gen: Well-developed,well-nourished,in no acute distress; alert,appropriate and cooperative throughout examination HEENT: Normocephalic and atraumatic without obvious abnormalities.  Ears, externally no deformities Pulm: Breathing comfortably in no respiratory distress Range of motion at  the  waist: Flexion, rotation and lateral bending: full  No echymosis or edema Rises to examination table with no difficulty Gait: minimally antalgic  Inspection/Deformity: No abnormality Paraspinus T:  l1-s1 ttp on exam  B Ankle Dorsiflexion (L5,4): 5/5 B Great Toe Dorsiflexion (L5,4): 5/5 Heel Walk (L5): WNL Toe Walk (S1): WNL Rise/Squat (L4): WNL, mild pain  SENSORY B Medial Foot (L4): WNL B Dorsum (L5): WNL B Lateral (S1): WNL Light Touch: WNL Pinprick: WNL  REFLEXES Knee (L4): 2+ Ankle (S1): 2+  B SLR, seated: neg B SLR, supine: neg B FABER: neg B Reverse FABER: neg B Greater Troch: NT B Log Roll: neg B Stork: NT B Sciatic Notch: NT  Radiology: Results for orders placed or performed in visit on 05/20/19  Hepatic function panel  Result Value Ref Range   Total Bilirubin 0.5 0.2 - 1.2 mg/dL   Bilirubin, Direct 0.1 0.0 - 0.3 mg/dL   Alkaline Phosphatase 46 39 - 117 U/L   AST 79 (H) 0 - 37 U/L   ALT 52 0 - 53 U/L   Total Protein 7.7 6.0 - 8.3 g/dL   Albumin 5.0 3.5 - 5.2 g/dL  Basic metabolic panel  Result Value Ref Range   Sodium 139 135 - 145 mEq/L   Potassium 3.9 3.5 - 5.1 mEq/L   Chloride 101 96 - 112 mEq/L   CO2 31 19 - 32 mEq/L   Glucose, Bld 80 70 - 99 mg/dL   BUN 26 (H) 6 - 23 mg/dL   Creatinine, Ser 4.09 (H) 0.40 - 1.50 mg/dL   Calcium 81.1 8.4 - 91.4 mg/dL   GFR 78.29 (L) >56.21 mL/min     Assessment and Plan:     ICD-10-CM   1. Acute back pain, unspecified back location, unspecified back pain laterality  M54.9   2. Elevated LFTs  R79.89 Hepatic function panel  3. Acute kidney injury (HCC)  N17.9 Basic metabolic panel    Anatomy reviewed. Conservative algorithms for acute back pain generally begin with the following: NSAIDS, Muscle Relaxants, Mild pain medication  Start with medications, core rehab, and progress from there following low back pain algorithm. No red flags are present.  We will follow-up with his LFTs and kidney function as  well, and he does have follow-up with his primary care doctor in a couple of weeks.  At the time of this dictation, his LFTs had improved but not back to normal.  His creatinine is still elevated.  Instruct him to drink plenty of liquids, and he should be off of all diuretics.  Follow-up: No follow-ups on file.  Meds ordered this encounter  Medications  . methylPREDNISolone (MEDROL DOSEPAK) 4 MG TBPK tablet    Sig: Follow directions on Dose Pak: Day 1: 2 tablets before breakfast, 1 tablet after lunch and after supper, 2 tablets at bedtime Day 2: 1 tablet before breakfast, 1 tablet after lunch and after supper, 2 tablets at bedtime Day 3: 1 tablet before breakfast and 1 tablet after lunch, after supper, and at bedtime Day 4: 1 tablet before breakfast, after lunch, and at bedtime Day 5: 1 tablet before breakfast and at bedtime Day 6: 1 tablet before breakfast.    Dispense:  21 tablet    Refill:  0    Dispense 1 Methylprednisolone Dose Pak   Orders Placed This Encounter  Procedures  . Hepatic function panel  . Basic metabolic panel    Signed,  Karleen Hampshire T. Aldeen Riga, MD   Allergies as of 05/20/2019      Reactions   Amoxicillin Nausea Only   Other reaction(s): Other (See Comments), Vomiting Causes N/V and GI upset   Montelukast    Other reaction(s): Confusion (intolerance), Other (See Comments) Causes forgetfulness   Prednisone       Medication List       Accurate as of May 20, 2019 11:59 PM. If you have any questions, ask your nurse or doctor.        STOP taking these medications   furosemide 20 MG tablet Commonly known as: LASIX Stopped by: Hannah Beat, MD   lisinopril 10 MG tablet Commonly known as: ZESTRIL Stopped by: Hannah Beat, MD  TAKE these medications   albuterol 108 (90 Base) MCG/ACT inhaler Commonly known as: VENTOLIN HFA Inhale 1-2 puffs into the lungs every 6 (six) hours as needed.   hydrOXYzine 50 MG tablet Commonly known as:  ATARAX/VISTARIL Take 1 tablet (50 mg total) by mouth 3 (three) times daily as needed for anxiety.   LORazepam 1 MG tablet Commonly known as: ATIVAN Take 1 tablet (1 mg total) by mouth at bedtime.   methocarbamol 500 MG tablet Commonly known as: Robaxin Take 1 tablet (500 mg total) by mouth every 8 (eight) hours as needed for muscle spasms. Caution of sedation   methylPREDNISolone 4 MG Tbpk tablet Commonly known as: MEDROL DOSEPAK Follow directions on Dose Pak: Day 1: 2 tablets before breakfast, 1 tablet after lunch and after supper, 2 tablets at bedtime Day 2: 1 tablet before breakfast, 1 tablet after lunch and after supper, 2 tablets at bedtime Day 3: 1 tablet before breakfast and 1 tablet after lunch, after supper, and at bedtime Day 4: 1 tablet before breakfast, after lunch, and at bedtime Day 5: 1 tablet before breakfast and at bedtime Day 6: 1 tablet before breakfast. Started by: Hannah BeatSpencer Amadeo Coke, MD

## 2019-05-20 NOTE — Patient Instructions (Addendum)
Stop your lisinopril.  Stop your furosemide   STOP YOUR TYLENOL

## 2019-05-21 LAB — HEPATIC FUNCTION PANEL
ALT: 52 U/L (ref 0–53)
AST: 79 U/L — ABNORMAL HIGH (ref 0–37)
Albumin: 5 g/dL (ref 3.5–5.2)
Alkaline Phosphatase: 46 U/L (ref 39–117)
Bilirubin, Direct: 0.1 mg/dL (ref 0.0–0.3)
Total Bilirubin: 0.5 mg/dL (ref 0.2–1.2)
Total Protein: 7.7 g/dL (ref 6.0–8.3)

## 2019-05-21 LAB — BASIC METABOLIC PANEL
BUN: 26 mg/dL — ABNORMAL HIGH (ref 6–23)
CO2: 31 mEq/L (ref 19–32)
Calcium: 10.2 mg/dL (ref 8.4–10.5)
Chloride: 101 mEq/L (ref 96–112)
Creatinine, Ser: 1.81 mg/dL — ABNORMAL HIGH (ref 0.40–1.50)
GFR: 51.5 mL/min — ABNORMAL LOW (ref >60.00)
Glucose, Bld: 80 mg/dL (ref 70–99)
Potassium: 3.9 mEq/L (ref 3.5–5.1)
Sodium: 139 mEq/L (ref 135–145)

## 2019-05-23 ENCOUNTER — Encounter: Payer: Self-pay | Admitting: Family Medicine

## 2019-05-23 DIAGNOSIS — F1021 Alcohol dependence, in remission: Secondary | ICD-10-CM

## 2019-05-23 DIAGNOSIS — R7989 Other specified abnormal findings of blood chemistry: Secondary | ICD-10-CM

## 2019-05-24 ENCOUNTER — Encounter: Payer: Self-pay | Admitting: Family Medicine

## 2019-05-26 MED ORDER — ALBUTEROL SULFATE HFA 108 (90 BASE) MCG/ACT IN AERS: 1.0000 | INHALATION_SPRAY | Freq: Four times a day (QID) | RESPIRATORY_TRACT | 2 refills | Status: DC | PRN

## 2019-05-27 ENCOUNTER — Emergency Department (HOSPITAL_COMMUNITY): Payer: BC Managed Care – PPO

## 2019-05-27 ENCOUNTER — Emergency Department (HOSPITAL_COMMUNITY)
Admission: EM | Admit: 2019-05-27 | Discharge: 2019-05-27 | Disposition: A | Discharge status: Home / Self Care | Payer: BC Managed Care – PPO | Attending: Emergency Medicine | Admitting: Emergency Medicine

## 2019-05-27 ENCOUNTER — Other Ambulatory Visit: Payer: Self-pay

## 2019-05-27 ENCOUNTER — Encounter (HOSPITAL_COMMUNITY): Payer: Self-pay | Admitting: Emergency Medicine

## 2019-05-27 DIAGNOSIS — G4730 Sleep apnea, unspecified: Secondary | ICD-10-CM | POA: Diagnosis not present

## 2019-05-27 DIAGNOSIS — R0981 Nasal congestion: Secondary | ICD-10-CM

## 2019-05-27 DIAGNOSIS — R0602 Shortness of breath: Secondary | ICD-10-CM | POA: Diagnosis not present

## 2019-05-27 LAB — CBC WITH DIFFERENTIAL/PLATELET
Abs Immature Granulocytes: 0.03 10*3/uL (ref 0.00–0.07)
Basophils Absolute: 0 10*3/uL (ref 0.0–0.1)
Basophils Relative: 1 %
Eosinophils Absolute: 0.1 10*3/uL (ref 0.0–0.5)
Eosinophils Relative: 2 %
HCT: 41.1 % (ref 39.0–52.0)
Hemoglobin: 13.6 g/dL (ref 13.0–17.0)
Immature Granulocytes: 1 %
Lymphocytes Relative: 13 %
Lymphs Abs: 0.9 10*3/uL (ref 0.7–4.0)
MCH: 30.4 pg (ref 26.0–34.0)
MCHC: 33.1 g/dL (ref 30.0–36.0)
MCV: 91.9 fL (ref 80.0–100.0)
Monocytes Absolute: 0.4 10*3/uL (ref 0.1–1.0)
Monocytes Relative: 6 %
Neutro Abs: 5.1 10*3/uL (ref 1.7–7.7)
Neutrophils Relative %: 77 %
Platelets: 206 10*3/uL (ref 150–400)
RBC: 4.47 MIL/uL (ref 4.22–5.81)
RDW: 15.1 % (ref 11.5–15.5)
WBC: 6.6 10*3/uL (ref 4.0–10.5)
nRBC: 0 % (ref 0.0–0.2)

## 2019-05-27 LAB — COMPREHENSIVE METABOLIC PANEL
ALT: 74 U/L — ABNORMAL HIGH (ref 0–44)
AST: 101 U/L — ABNORMAL HIGH (ref 15–41)
Albumin: 4.7 g/dL (ref 3.5–5.0)
Alkaline Phosphatase: 52 U/L (ref 38–126)
Anion gap: 11 (ref 5–15)
BUN: 28 mg/dL — ABNORMAL HIGH (ref 6–20)
CO2: 26 mmol/L (ref 22–32)
Calcium: 9.9 mg/dL (ref 8.9–10.3)
Chloride: 99 mmol/L (ref 98–111)
Creatinine, Ser: 1.81 mg/dL — ABNORMAL HIGH (ref 0.61–1.24)
GFR calc Af Amer: 55 mL/min — ABNORMAL LOW (ref >60)
GFR calc non Af Amer: 47 mL/min — ABNORMAL LOW (ref >60)
Glucose, Bld: 103 mg/dL — ABNORMAL HIGH (ref 70–99)
Potassium: 4.1 mmol/L (ref 3.5–5.1)
Sodium: 136 mmol/L (ref 135–145)
Total Bilirubin: 0.7 mg/dL (ref 0.3–1.2)
Total Protein: 7.6 g/dL (ref 6.5–8.1)

## 2019-05-27 NOTE — ED Triage Notes (Signed)
Patient reports SOB when lying on bed onset 2 days ago , denies chest pain or cough , history of asthma /sleep apnea, no fever or chills .

## 2019-05-27 NOTE — ED Provider Notes (Signed)
[facility] EMERGENCY DEPARTMENT Provider Note   CSN: [id number] Arrival date & time: 05/27/19  0056     History   Chief Complaint Chief Complaint  Patient presents with  . Shortness of Breath    HPI Carlos Wheeler is a 38 y.o. male who presents with shortness of breath. PMH significant for GERD, anxiety, hx of asthma, sleep apnea, hx of ETOH abuse currently in remission.  He states that the past 2-3 nights when he puts his CPAP on he will have to take it off very quickly because he will feel like it is choking him.  Last night it happened again so he decided to come to the ED.  He states that when this happens he will get extremely anxious and will have to take anxiety medicine.  He took his hydroxyzine which made him sleepy.  He also took a leftover lorazepam that he had but he states that this did not work at all.  He states that he does not know if his kids have been messing with his CPAP or not and is going to get it checked.  He also endorses seasonal allergies and has been having a lot of issues with nasal congestion.  He has been using a nasal spray for approximately 2 weeks without much relief.  He has been afraid to take anything else because he has been back and forth between the doctor and they gave him some blood pressure medicine and he does not want to take anything else with this medicine.  He states that since he took the blood pressure medicine he has been having a lot of issues.  He denies fever, chills, sinus pressure, sore throat, cough, current shortness of breath, chest pain, leg swelling.  He is wanted to make sure he does not have a "blockage in my throat".     HPI  Past Medical History:  Diagnosis Date  . Anxiety   . Asthma   . GERD (gastroesophageal reflux disease)   . Sleep apnea     Patient Active Problem List   Diagnosis Date Noted  . Loss of taste 05/11/2019  . History of heavy alcohol consumption 05/10/2019  . Fluid retention  04/24/2019  . Low back pain 04/24/2019  . Elevated random blood glucose level 02/05/2019  . Essential hypertension 02/05/2019  . Increased thirst 11/25/2018  . Anxiety 09/30/2018  . Obesity (BMI 30.0-34.9) 12/04/2016  . Obstructive sleep apnea of adult 08/02/2016    Past Surgical History:  Procedure Laterality Date  . NO PAST SURGERIES          Home Medications    Prior to Admission medications   Medication Sig Start Date End Date Taking? Authorizing Provider  albuterol (VENTOLIN HFA) 108 (90 Base) MCG/ACT inhaler Inhale 1-2 puffs into the lungs every 6 (six) hours as needed. 05/26/19   [name], MD  hydrOXYzine (ATARAX/VISTARIL) 50 MG tablet Take 1 tablet (50 mg total) by mouth 3 (three) times daily as needed for anxiety. 02/05/19   [name], MD  LORazepam (ATIVAN) 1 MG tablet Take 1 tablet (1 mg total) by mouth at bedtime. 09/30/18   [name], MD  methocarbamol (ROBAXIN) 500 MG tablet Take 1 tablet (500 mg total) by mouth every 8 (eight) hours as needed for muscle spasms. Caution of sedation 04/24/19   Tower, Audrie Gallus, MD  methylPREDNISolone (MEDROL DOSEPAK) 4 MG TBPK tablet Follow directions on Dose Pak: Day 1: 2 tablets before breakfast,  1 tablet after lunch and after supper, 2 tablets at bedtime Day 2: 1 tablet before breakfast, 1 tablet after lunch and after supper, 2 tablets at bedtime Day 3: 1 tablet before breakfast and 1 tablet after lunch, after supper, and at bedtime Day 4: 1 tablet before breakfast, after lunch, and at bedtime Day 5: 1 tablet before breakfast and at bedtime Day 6: 1 tablet before breakfast. 05/20/19   Copland, Frederico Hamman, MD    Family History Family History  Problem Relation Age of Onset  . Hyperlipidemia Mother   . Hypertension Mother   . Heart attack Father 68  . Heart disease Father   . Hypertension Father   . Diabetes Sister   . Hyperlipidemia Sister   . Hypertension Sister   . Diabetes Maternal Grandmother   . Hypertension  Maternal Grandmother   . Diabetes Maternal Grandfather   . Hypertension Maternal Grandfather   . Heart attack Maternal Grandfather 70  . Diabetes Paternal Grandmother   . Stroke Paternal Grandmother 43  . Diabetes Paternal Grandfather   . Cancer Neg Hx     Social History Social History   Tobacco Use  . Smoking status: Never Smoker  . Smokeless tobacco: Never Used  Substance Use Topics  . Alcohol use: Yes    Comment: sometimes daily- 2-3 liquor servings  . Drug use: No     Allergies   Amoxicillin, Montelukast, and Prednisone   Review of Systems Review of Systems  Constitutional: Negative for chills and fever.  HENT: Positive for congestion. Negative for rhinorrhea, sinus pressure, sinus pain and sore throat.   Respiratory: Positive for shortness of breath (transient, resolved). Negative for cough and wheezing.   Cardiovascular: Negative for chest pain.  Gastrointestinal: Negative for abdominal pain.  All other systems reviewed and are negative.    Physical Exam Updated Vital Signs BP 114/88 (BP Location: Left Arm)   Pulse 64   Temp 98.5 F (36.9 C) (Oral)   Resp 16   SpO2 98%   Physical Exam Vitals signs and nursing note reviewed.  Constitutional:      General: He is not in acute distress.    Appearance: He is well-developed. He is not ill-appearing.     Comments: Calm, cooperative. NAD. Sleepy  HENT:     Head: Normocephalic and atraumatic.     Nose: Mucosal edema present.  Eyes:     General: No scleral icterus.       Right eye: No discharge.        Left eye: No discharge.     Conjunctiva/sclera: Conjunctivae normal.     Pupils: Pupils are equal, round, and reactive to light.  Neck:     Musculoskeletal: Normal range of motion.  Cardiovascular:     Rate and Rhythm: Normal rate and regular rhythm.  Pulmonary:     Effort: Pulmonary effort is normal. No respiratory distress.     Breath sounds: Normal breath sounds.  Abdominal:     General: There is no  distension.  Skin:    General: Skin is warm and dry.  Neurological:     Mental Status: He is alert and oriented to person, place, and time.  Psychiatric:        Behavior: Behavior normal.      ED Treatments / Results  Labs (all labs ordered are listed, but only abnormal results are displayed) Labs Reviewed  COMPREHENSIVE METABOLIC PANEL - Abnormal; Notable for the following components:      Result Value  Glucose, Bld 103 (*)    BUN 28 (*)    Creatinine, Ser 1.81 (*)    AST 101 (*)    ALT 74 (*)    GFR calc non Af Amer 47 (*)    GFR calc Af Amer 55 (*)    All other components within normal limits  CBC WITH DIFFERENTIAL/PLATELET    EKG None  Radiology Dg Chest 2 View  Result Date: 05/27/2019 CLINICAL DATA:  Shortness of breath for 2 days EXAM: CHEST - 2 VIEW COMPARISON:  Chest radiograph 05/16/2019 FINDINGS: No consolidation, features of edema, pneumothorax, or effusion. Pulmonary vascularity is normally distributed. The cardiomediastinal contours are unremarkable. No acute osseous or soft tissue abnormality. IMPRESSION: No acute cardiopulmonary abnormality. Electronically Signed   By: Kreg ShropshirePrice  DeHay M.D.   On: 05/27/2019 02:38    Procedures Procedures (including critical care time)  Medications Ordered in ED Medications - No data to display   Initial Impression / Assessment and Plan / ED Course  I have reviewed the triage vital signs and the nursing notes.  Pertinent labs & imaging results that were available during my care of the patient were reviewed by me and considered in my medical decision making (see chart for details).  38 year old male presents with shortness of breath for the past 3 nights when using his CPAP.  Probably exacerbated by his seasonal allergies and nasal congestion.  He denies any symptoms presently.  His vital signs are normal.  His exam is remarkable for some mucosal edema of the nasal passages but otherwise lungs are clear to auscultation.  He  is going to have his CPAP checked today.  EKG is sinus rhythm.  Chest x-ray is negative.  Labs are similar to prior.  Recommended over-the-counter allergy medicine as needed and PCP follow-up.  Final Clinical Impressions(s) / ED Diagnoses   Final diagnoses:  Sleep apnea, unspecified type  Nasal congestion    ED Discharge Orders    None       Bethel BornGekas, Azucena Dart Marie, PA-C 05/27/19 [id number]    Gilda CreasePollina, Christopher J, MD 05/27/19 [phone]

## 2019-05-27 NOTE — ED Notes (Signed)
Patient verbalizes understanding of discharge instructions. Opportunity for questioning and answers were provided. Armband removed by staff, pt discharged from ED.  

## 2019-05-27 NOTE — Discharge Instructions (Signed)
Please try Flonase nasal spray which is over the counter and an antihistamine for allergies (Zyrtec, Claritin,or  Allegra) Do not use Hydroxyzine while taking allergy medicine Please have CPAP checked Return if worsening

## 2019-05-28 ENCOUNTER — Other Ambulatory Visit: Payer: BC Managed Care – PPO

## 2019-06-02 NOTE — Telephone Encounter (Signed)
Pt has appointment already schedule with dr cody 06/09/2019

## 2019-06-04 ENCOUNTER — Encounter: Payer: Self-pay | Admitting: Family Medicine

## 2019-06-04 ENCOUNTER — Encounter (HOSPITAL_COMMUNITY): Payer: Self-pay | Admitting: Emergency Medicine

## 2019-06-04 ENCOUNTER — Emergency Department (HOSPITAL_COMMUNITY)
Admission: EM | Admit: 2019-06-04 | Discharge: 2019-06-04 | Disposition: A | Discharge status: Home / Self Care | Payer: BC Managed Care – PPO | Attending: Emergency Medicine | Admitting: Emergency Medicine

## 2019-06-04 ENCOUNTER — Emergency Department (HOSPITAL_COMMUNITY): Payer: BC Managed Care – PPO

## 2019-06-04 DIAGNOSIS — Z79899 Other long term (current) drug therapy: Secondary | ICD-10-CM | POA: Insufficient documentation

## 2019-06-04 DIAGNOSIS — I10 Essential (primary) hypertension: Secondary | ICD-10-CM | POA: Diagnosis not present

## 2019-06-04 DIAGNOSIS — R600 Localized edema: Secondary | ICD-10-CM | POA: Diagnosis not present

## 2019-06-04 DIAGNOSIS — R2243 Localized swelling, mass and lump, lower limb, bilateral: Secondary | ICD-10-CM | POA: Diagnosis not present

## 2019-06-04 DIAGNOSIS — R4781 Slurred speech: Secondary | ICD-10-CM | POA: Insufficient documentation

## 2019-06-04 DIAGNOSIS — J45909 Unspecified asthma, uncomplicated: Secondary | ICD-10-CM | POA: Diagnosis not present

## 2019-06-04 DIAGNOSIS — R609 Edema, unspecified: Secondary | ICD-10-CM | POA: Diagnosis not present

## 2019-06-04 LAB — DIFFERENTIAL
Abs Immature Granulocytes: 0.02 10*3/uL (ref 0.00–0.07)
Basophils Absolute: 0 10*3/uL (ref 0.0–0.1)
Basophils Relative: 0 %
Eosinophils Absolute: 0.1 10*3/uL (ref 0.0–0.5)
Eosinophils Relative: 2 %
Immature Granulocytes: 0 %
Lymphocytes Relative: 10 %
Lymphs Abs: 0.6 10*3/uL — ABNORMAL LOW (ref 0.7–4.0)
Monocytes Absolute: 0.6 10*3/uL (ref 0.1–1.0)
Monocytes Relative: 9 %
Neutro Abs: 5 10*3/uL (ref 1.7–7.7)
Neutrophils Relative %: 79 %

## 2019-06-04 LAB — I-STAT CHEM 8, ED
BUN: 22 mg/dL — ABNORMAL HIGH (ref 6–20)
Calcium, Ion: 1.17 mmol/L (ref 1.15–1.40)
Chloride: 102 mmol/L (ref 98–111)
Creatinine, Ser: 1.8 mg/dL — ABNORMAL HIGH (ref 0.61–1.24)
Glucose, Bld: 77 mg/dL (ref 70–99)
HCT: 42 % (ref 39.0–52.0)
Hemoglobin: 14.3 g/dL (ref 13.0–17.0)
Potassium: 3.8 mmol/L (ref 3.5–5.1)
Sodium: 138 mmol/L (ref 135–145)
TCO2: 27 mmol/L (ref 22–32)

## 2019-06-04 LAB — CBC
HCT: 42.5 % (ref 39.0–52.0)
Hemoglobin: 13.3 g/dL (ref 13.0–17.0)
MCH: 29.4 pg (ref 26.0–34.0)
MCHC: 31.3 g/dL (ref 30.0–36.0)
MCV: 93.8 fL (ref 80.0–100.0)
Platelets: 202 10*3/uL (ref 150–400)
RBC: 4.53 MIL/uL (ref 4.22–5.81)
RDW: 15.7 % — ABNORMAL HIGH (ref 11.5–15.5)
WBC: 6.3 10*3/uL (ref 4.0–10.5)
nRBC: 0 % (ref 0.0–0.2)

## 2019-06-04 LAB — COMPREHENSIVE METABOLIC PANEL
ALT: 61 U/L — ABNORMAL HIGH (ref 0–44)
AST: 102 U/L — ABNORMAL HIGH (ref 15–41)
Albumin: 4.7 g/dL (ref 3.5–5.0)
Alkaline Phosphatase: 51 U/L (ref 38–126)
Anion gap: 11 (ref 5–15)
BUN: 19 mg/dL (ref 6–20)
CO2: 25 mmol/L (ref 22–32)
Calcium: 9.6 mg/dL (ref 8.9–10.3)
Chloride: 101 mmol/L (ref 98–111)
Creatinine, Ser: 1.92 mg/dL — ABNORMAL HIGH (ref 0.61–1.24)
GFR calc Af Amer: 51 mL/min — ABNORMAL LOW (ref >60)
GFR calc non Af Amer: 44 mL/min — ABNORMAL LOW (ref >60)
Glucose, Bld: 78 mg/dL (ref 70–99)
Potassium: 4 mmol/L (ref 3.5–5.1)
Sodium: 137 mmol/L (ref 135–145)
Total Bilirubin: 0.8 mg/dL (ref 0.3–1.2)
Total Protein: 7.5 g/dL (ref 6.5–8.1)

## 2019-06-04 LAB — PROTIME-INR
INR: 1 (ref 0.8–1.2)
Prothrombin Time: 12.6 seconds (ref 11.4–15.2)

## 2019-06-04 LAB — BRAIN NATRIURETIC PEPTIDE: B Natriuretic Peptide: 16.3 pg/mL (ref 0.0–100.0)

## 2019-06-04 LAB — APTT: aPTT: 35 seconds (ref 24–36)

## 2019-06-04 MED ORDER — SODIUM CHLORIDE 0.9% FLUSH: 3.0000 mL | Freq: Once | INTRAVENOUS | Status: DC

## 2019-06-04 NOTE — Telephone Encounter (Signed)
He is on the way to the ER.

## 2019-06-04 NOTE — Telephone Encounter (Addendum)
Pt said for last 3 wks the swelling from hands up to elbows and ankle swelling has worsened to the point pt is at work now and cannot type more than one sentence before having to stop due to tightness in his hands; pt said his hands are virtually useless now.pts speech is slurred on and off for 1 wk; no slurred speech now. No CP, SOB,H/A or dizziness.Pt said he has issues with anxiety also. Pt said he has never had a seizure before but now pt feels like he is going to have seizure and swallow his tongue. I offered to call 911 but pt said someone from work would take pt to [facility] ED now.Pt has covid symptoms of muscle pain in lower arms, dry cough,runny nose and feels fatigued, no travel and no known exposure to + covid.   FYI to Dr Lorelei Pont and Dr Einar Pheasant.

## 2019-06-04 NOTE — ED Triage Notes (Addendum)
PT having swelling in hands bilaterally for 2 weeks. Pain in knee down. Speech slurred over last week. And "feels like going to swallow tongue". Does not take blood thinners. Hx of high BP. Denies diabetes and is not taking any meds. Reports numbness and tingling in bottom of back. He reports all these seems to start when back starts to hurt.

## 2019-06-04 NOTE — ED Provider Notes (Signed)
[facility]   CSN: [id number] Arrival date & time: 06/04/19  1009     History   Chief Complaint Chief Complaint  Patient presents with   Cerebrovascular Accident    HPI Carlos Wheeler is a 38 y.o. male.     HPI Pt states the main reason he is here is when he starts having back pain he will notice swelling in his hands and feet.  Pt was started on blood pressure medications recently and had to stop those medications.  Pt states he was started on steroids and felt better after that.  His main concerns is the swelling of his hands.  Patient feels like is mostly during the week and he does not notice during the weekends.  Patient also feels like it tends to get worse after he has a meal.  During the weekday, the patient is eating primarily at fast food restaurants.  Patient felt like he was having swelling again this morning but his symptoms have all resolved now at the bedside.  Patient has never had any trouble with shortness of breath.  He is not having any chest pain.  He denies any trouble with fevers or chills.  He denies any recent injuries.  The swelling when he gets it is mostly in his hands and also his feet.   Pt had been drinking heavily.  He stopped drinking and no longer is drinking alcohol.  Patient has seen his primary care doctor and had been started on some blood pressure medications but felt like he was developing side effects from those and has stopped taking them.  He no longer is taking the steroids and did not feel that his swelling was worse when that occurred.  No history of PE or DVT.  No history of heart failure.  Patient does continue to urinate normally.  When asked the patient about the slurred piece of speech that he mentioned to the nurse he indicates that he is not really having that and thinks maybe it was related to his anxiety. Past Medical History:  Diagnosis Date   Anxiety    Asthma     GERD (gastroesophageal reflux disease)    Sleep apnea     Patient Active Problem List   Diagnosis Date Noted   Loss of taste 05/11/2019   History of heavy alcohol consumption 05/10/2019   Fluid retention 04/24/2019   Low back pain 04/24/2019   Elevated random blood glucose level 02/05/2019   Essential hypertension 02/05/2019   Increased thirst 11/25/2018   Anxiety 09/30/2018   Obesity (BMI 30.0-34.9) 12/04/2016   Obstructive sleep apnea of adult 08/02/2016    Past Surgical History:  Procedure Laterality Date   NO PAST SURGERIES          Home Medications    Prior to Admission medications   Medication Sig Start Date End Date Taking? Authorizing Provider  albuterol (VENTOLIN HFA) 108 (90 Base) MCG/ACT inhaler Inhale 1-2 puffs into the lungs every 6 (six) hours as needed. 05/26/19   Lynnda Childody, Jessica R, MD  hydrOXYzine (ATARAX/VISTARIL) 50 MG tablet Take 1 tablet (50 mg total) by mouth 3 (three) times daily as needed for anxiety. 02/05/19   Lynnda Childody, Jessica R, MD  LORazepam (ATIVAN) 1 MG tablet Take 1 tablet (1 mg total) by mouth at bedtime. 09/30/18   Lynnda Childody, Jessica R, MD  methocarbamol (ROBAXIN) 500 MG tablet Take 1 tablet (500 mg total) by mouth every 8 (eight) hours as  needed for muscle spasms. Caution of sedation 04/24/19   Tower, Audrie Gallus, MD  methylPREDNISolone (MEDROL DOSEPAK) 4 MG TBPK tablet Follow directions on Dose Pak: Day 1: 2 tablets before breakfast, 1 tablet after lunch and after supper, 2 tablets at bedtime Day 2: 1 tablet before breakfast, 1 tablet after lunch and after supper, 2 tablets at bedtime Day 3: 1 tablet before breakfast and 1 tablet after lunch, after supper, and at bedtime Day 4: 1 tablet before breakfast, after lunch, and at bedtime Day 5: 1 tablet before breakfast and at bedtime Day 6: 1 tablet before breakfast. 05/20/19   Copland, Karleen Hampshire, MD    Family History Family History  Problem Relation Age of Onset   Hyperlipidemia Mother    Hypertension  Mother    Heart attack Father 45   Heart disease Father    Hypertension Father    Diabetes Sister    Hyperlipidemia Sister    Hypertension Sister    Diabetes Maternal Grandmother    Hypertension Maternal Grandmother    Diabetes Maternal Grandfather    Hypertension Maternal Grandfather    Heart attack Maternal Grandfather 38   Diabetes Paternal Grandmother    Stroke Paternal Grandmother 52   Diabetes Paternal Grandfather    Cancer Neg Hx     Social History Social History   Tobacco Use   Smoking status: Never Smoker   Smokeless tobacco: Never Used  Substance Use Topics   Alcohol use: Yes    Comment: sometimes daily- 2-3 liquor servings   Drug use: No     Allergies   Amoxicillin, Montelukast, and Prednisone   Review of Systems Review of Systems  All other systems reviewed and are negative.    Physical Exam Updated Vital Signs BP 136/81 (BP Location: Left Arm)    Pulse 65    Temp 98.1 F (36.7 C) (Oral)    Resp 16    SpO2 98%   Physical Exam Vitals signs and nursing note reviewed.  Constitutional:      General: He is not in acute distress.    Appearance: He is well-developed.  HENT:     Head: Normocephalic and atraumatic.     Right Ear: External ear normal.     Left Ear: External ear normal.  Eyes:     General: No scleral icterus.       Right eye: No discharge.        Left eye: No discharge.     Conjunctiva/sclera: Conjunctivae normal.  Neck:     Musculoskeletal: Neck supple.     Trachea: No tracheal deviation.  Cardiovascular:     Rate and Rhythm: Normal rate and regular rhythm.  Pulmonary:     Effort: Pulmonary effort is normal. No respiratory distress.     Breath sounds: Normal breath sounds. No stridor. No wheezing or rales.  Abdominal:     General: Bowel sounds are normal. There is no distension.     Palpations: Abdomen is soft.     Tenderness: There is no abdominal tenderness. There is no guarding or rebound.    Musculoskeletal:        General: No swelling, tenderness or deformity.     Right lower leg: No edema.     Left lower leg: No edema.  Skin:    General: Skin is warm and dry.     Findings: No rash.  Neurological:     Mental Status: He is alert.     Cranial Nerves: No cranial nerve  deficit (no facial droop, extraocular movements intact, no slurred speech).     Sensory: No sensory deficit.     Motor: No abnormal muscle tone or seizure activity.     Coordination: Coordination normal.      ED Treatments / Results  Labs (all labs ordered are listed, but only abnormal results are displayed) Labs Reviewed  CBC - Abnormal; Notable for the following components:      Result Value   RDW 15.7 (*)    All other components within normal limits  DIFFERENTIAL - Abnormal; Notable for the following components:   Lymphs Abs 0.6 (*)    All other components within normal limits  COMPREHENSIVE METABOLIC PANEL - Abnormal; Notable for the following components:   Creatinine, Ser 1.92 (*)    AST 102 (*)    ALT 61 (*)    GFR calc non Af Amer 44 (*)    GFR calc Af Amer 51 (*)    All other components within normal limits  I-STAT CHEM 8, ED - Abnormal; Notable for the following components:   BUN 22 (*)    Creatinine, Ser 1.80 (*)    All other components within normal limits  PROTIME-INR  APTT  BRAIN NATRIURETIC PEPTIDE  CBG MONITORING, ED    EKG None  Radiology Ct Head Wo Contrast  Result Date: 06/04/2019 CLINICAL DATA:  Slurred speech for 3 days, possible stroke EXAM: CT HEAD WITHOUT CONTRAST TECHNIQUE: Contiguous axial images were obtained from the base of the skull through the vertex without intravenous contrast. COMPARISON:  None. FINDINGS: Brain: No evidence of acute infarction, hemorrhage, hydrocephalus, extra-axial collection or mass lesion/mass effect. Vascular: No hyperdense vessel or unexpected calcification. Skull: Normal. Negative for fracture or focal lesion. Sinuses/Orbits: No  acute finding. Other: None. IMPRESSION: No acute intracranial pathology. No non-contrast CT evidence of acute stroke or hemorrhage. Electronically Signed   By: Eddie Candle M.D.   On: 06/04/2019 11:10    Procedures Procedures (including critical care time)  Medications Ordered in ED Medications  sodium chloride flush (NS) 0.9 % injection 3 mL (has no administration in time range)     Initial Impression / Assessment and Plan / ED Course  I have reviewed the triage vital signs and the nursing notes.  Pertinent labs & imaging results that were available during my care of the patient were reviewed by me and considered in my medical decision making (see chart for details).   Patient had laboratory tests and a CT scan ordered at triage.  During my evaluation, the patient did not mention any issues with slurred speech.  He has no acute neurologic findings.  His main concern was swelling.  On exam the patient does not have any evidence of edema. he also does not have any joint swelling.  No findings to suggest DVT or CHF.  Patient does have chronic elevation of his creatinine.  He also has mild elevation of his LFTs.  Patient is currently not hypertensive.  It is possible the patient's dietary choices could be contributing to his intermittent swelling.  I did recommend a low-salt diet.  I think he stable to follow-up with his primary care doctor.  Final Clinical Impressions(s) / ED Diagnoses   Final diagnoses:  Peripheral edema    ED Discharge Orders    None       Dorie Rank, MD 06/04/19 1238

## 2019-06-04 NOTE — Discharge Instructions (Signed)
Decrease your salt intake as we discussed.  Follow-up with your primary care doctor as planned.

## 2019-06-04 NOTE — Telephone Encounter (Signed)
I just spoke with the patient myself.  He is not feeling well and he has had some slurred speech intermittently for the last week.  This is been off and on.  Please review Ms. Isley's note above.  He is having remarkable swelling currently.  As I am not the patient's primary care doctor, I was not given any follow-up regarding his labs.  At this point his LFTs are triple with a normally would be.  I did place a gastroenterology appointment for him, relatively urgent.  The patient declined this appointment on May 29, 2019.  The patient is concerned about his back pain, but in my opinion this is the least of his worries.  He needs to be evaluated and the causes for his renal impairment as well as his liver impairment needs to be identified.

## 2019-06-09 ENCOUNTER — Encounter: Payer: Self-pay | Admitting: Family Medicine

## 2019-06-09 ENCOUNTER — Ambulatory Visit (INDEPENDENT_AMBULATORY_CARE_PROVIDER_SITE_OTHER): Payer: BC Managed Care – PPO | Admitting: Family Medicine

## 2019-06-09 ENCOUNTER — Other Ambulatory Visit: Payer: Self-pay

## 2019-06-09 VITALS — BP 122/88 | HR 73 | Temp 98.2°F | Resp 18 | Ht 72.0 in | Wt 283.0 lb

## 2019-06-09 DIAGNOSIS — I10 Essential (primary) hypertension: Secondary | ICD-10-CM

## 2019-06-09 DIAGNOSIS — N179 Acute kidney failure, unspecified: Secondary | ICD-10-CM

## 2019-06-09 DIAGNOSIS — M5440 Lumbago with sciatica, unspecified side: Secondary | ICD-10-CM

## 2019-06-09 DIAGNOSIS — R7989 Other specified abnormal findings of blood chemistry: Secondary | ICD-10-CM

## 2019-06-09 DIAGNOSIS — R200 Anesthesia of skin: Secondary | ICD-10-CM | POA: Insufficient documentation

## 2019-06-09 DIAGNOSIS — N183 Chronic kidney disease, stage 3 unspecified: Secondary | ICD-10-CM | POA: Insufficient documentation

## 2019-06-09 NOTE — Progress Notes (Signed)
Subjective:     Carlos Wheeler is a 38 y.o. male presenting for ER follow up     HPI   04/24/2019 - clinic - for back pain  Alcohol - went from hard liquor 6 shots/day to only occasional red wine  #Back pain - back pain improved, swelling improved - started potassium increase over the last few days - swelling improved and numbness improved in hands   #Back Pain - slightly better over the last few days - has stopped treatment since all the health issues - stopped drinking - has some core strengthening   #AKI - initially in setting of HCTZ - has been off medication for >1 month - drinking 2 glass of water daily  #Hand numbness - worse at night - primarily the thumb and index finger as well as the arm from elbow down - does repetitive movements at work  Chart review  06/04/2019: ER - peripheral edema - not on exam. Abnormal lab results, advised low salt diet. Concern for slurred speech  -- Elevated LFTs - GI referral made -- AKI - still present - follow-up not clear 05/08/2019: Clinic - HTN - started HCTZ  Review of Systems  Constitutional: Negative for chills and fever.  Respiratory: Negative for chest tightness and shortness of breath.   Cardiovascular: Negative for chest pain.  Musculoskeletal: Positive for back pain.  Neurological: Positive for numbness.     Social History   Tobacco Use  Smoking Status Never Smoker  Smokeless Tobacco Never Used        Objective:    BP Readings from Last 3 Encounters:  06/09/19 122/88  06/04/19 (!) 136/97  05/27/19 123/86   Wt Readings from Last 3 Encounters:  06/09/19 283 lb (128.4 kg)  05/20/19 284 lb 8 oz (129 kg)  05/16/19 278 lb (126.1 kg)    BP 122/88   Pulse 73   Temp 98.2 F (36.8 C)   Resp 18   Ht 6' (1.829 m)   Wt 283 lb (128.4 kg)   SpO2 96%   BMI 38.38 kg/m    Physical Exam Constitutional:      Appearance: Normal appearance. He is not ill-appearing or diaphoretic.  HENT:   Right Ear: External ear normal.     Left Ear: External ear normal.     Nose: Nose normal.  Eyes:     General: No scleral icterus.    Extraocular Movements: Extraocular movements intact.     Conjunctiva/sclera: Conjunctivae normal.  Neck:     Musculoskeletal: Neck supple.  Cardiovascular:     Rate and Rhythm: Normal rate and regular rhythm.     Heart sounds: No murmur.  Pulmonary:     Effort: Pulmonary effort is normal. No respiratory distress.     Breath sounds: Normal breath sounds. No wheezing.  Skin:    General: Skin is warm and dry.  Neurological:     Mental Status: He is alert. Mental status is at baseline.  Psychiatric:        Mood and Affect: Mood normal.           Assessment & Plan:   Problem List Items Addressed This Visit      Cardiovascular and Mediastinum   Essential hypertension - Primary    BP stable since stopping drinking. No medication currently        Genitourinary   Acute kidney injury ([place])    Likely dehydration in setting of HCTZ but concerning that his recovery has  been slow (off medication x 1 month). Advised increased water intake and repeat in 1 week. If still high will likely do nephrology referral to consider further evaluation.       Relevant Orders   Basic metabolic panel     Other   Low back pain    Improving. Advised home exercise routine for core/back strengthening. Consider PT in the future if recurrent      Elevated LFTs    Discussed possible causes - alcohol, fatty liver disease, hepatitis. At this point would not like to pursue further work-up. Will work on weight loss and exercise and repeat in 2 months. Consider [place] and additional blood work at that time.       Hand numbness    Unable to do wrist exam due to time. Advised trial of wrist brace at night to see if carpal tunnel is the cause (as swelling as improved)          Return in about 1 week (around 06/16/2019) for lab follow-up .  [name], MD

## 2019-06-09 NOTE — Assessment & Plan Note (Signed)
Discussed possible causes - alcohol, fatty liver disease, hepatitis. At this point would not like to pursue further work-up. Will work on weight loss and exercise and repeat in 2 months. Consider [place] and additional blood work at that time.

## 2019-06-09 NOTE — Assessment & Plan Note (Signed)
BP stable since stopping drinking. No medication currently

## 2019-06-09 NOTE — Assessment & Plan Note (Signed)
Improving. Advised home exercise routine for core/back strengthening. Consider PT in the future if recurrent

## 2019-06-09 NOTE — Patient Instructions (Addendum)
#   Back pain - Continue potassium - Work on core strengthening - if worsening or returns we can do Physical Therapy  #Kidney issue - drink 64 oz daily for the next - return in 1 week for labs  #Liver - will repeat these test in 2 months - Work on weight loss - diet and exercise

## 2019-06-09 NOTE — Assessment & Plan Note (Signed)
Unable to do wrist exam due to time. Advised trial of wrist brace at night to see if carpal tunnel is the cause (as swelling as improved)

## 2019-06-09 NOTE — Assessment & Plan Note (Signed)
Likely dehydration in setting of HCTZ but concerning that his recovery has been slow (off medication x 1 month). Advised increased water intake and repeat in 1 week. If still high will likely do nephrology referral to consider further evaluation.

## 2019-06-10 ENCOUNTER — Encounter: Payer: Self-pay | Admitting: Family Medicine

## 2019-06-10 DIAGNOSIS — R7989 Other specified abnormal findings of blood chemistry: Secondary | ICD-10-CM

## 2019-06-10 DIAGNOSIS — N179 Acute kidney failure, unspecified: Secondary | ICD-10-CM

## 2019-06-10 DIAGNOSIS — I10 Essential (primary) hypertension: Secondary | ICD-10-CM

## 2019-06-10 DIAGNOSIS — M5440 Lumbago with sciatica, unspecified side: Secondary | ICD-10-CM

## 2019-06-14 ENCOUNTER — Encounter: Payer: Self-pay | Admitting: Emergency Medicine

## 2019-06-14 ENCOUNTER — Emergency Department
Admission: EM | Admit: 2019-06-14 | Discharge: 2019-06-14 | Disposition: A | Discharge status: Home / Self Care | Payer: BC Managed Care – PPO | Attending: Emergency Medicine | Admitting: Emergency Medicine

## 2019-06-14 ENCOUNTER — Other Ambulatory Visit: Payer: Self-pay

## 2019-06-14 ENCOUNTER — Emergency Department: Payer: BC Managed Care – PPO

## 2019-06-14 DIAGNOSIS — R200 Anesthesia of skin: Secondary | ICD-10-CM | POA: Diagnosis not present

## 2019-06-14 DIAGNOSIS — Z5321 Procedure and treatment not carried out due to patient leaving prior to being seen by health care provider: Secondary | ICD-10-CM | POA: Diagnosis not present

## 2019-06-14 LAB — CBC
HCT: 40 % (ref 39.0–52.0)
Hemoglobin: 12.7 g/dL — ABNORMAL LOW (ref 13.0–17.0)
MCH: 29.1 pg (ref 26.0–34.0)
MCHC: 31.8 g/dL (ref 30.0–36.0)
MCV: 91.7 fL (ref 80.0–100.0)
Platelets: 175 10*3/uL (ref 150–400)
RBC: 4.36 MIL/uL (ref 4.22–5.81)
RDW: 15.7 % — ABNORMAL HIGH (ref 11.5–15.5)
WBC: 5.2 10*3/uL (ref 4.0–10.5)
nRBC: 0 % (ref 0.0–0.2)

## 2019-06-14 LAB — BASIC METABOLIC PANEL
Anion gap: 6 (ref 5–15)
BUN: 28 mg/dL — ABNORMAL HIGH (ref 6–20)
CO2: 28 mmol/L (ref 22–32)
Calcium: 9.7 mg/dL (ref 8.9–10.3)
Chloride: 102 mmol/L (ref 98–111)
Creatinine, Ser: 1.84 mg/dL — ABNORMAL HIGH (ref 0.61–1.24)
GFR calc Af Amer: 53 mL/min — ABNORMAL LOW (ref >60)
GFR calc non Af Amer: 46 mL/min — ABNORMAL LOW (ref >60)
Glucose, Bld: 94 mg/dL (ref 70–99)
Potassium: 4.1 mmol/L (ref 3.5–5.1)
Sodium: 136 mmol/L (ref 135–145)

## 2019-06-14 LAB — MAGNESIUM: Magnesium: 2.2 mg/dL (ref 1.7–2.4)

## 2019-06-14 MED ORDER — SODIUM CHLORIDE 0.9% FLUSH: 3.0000 mL | Freq: Once | INTRAVENOUS | Status: DC

## 2019-06-14 NOTE — ED Triage Notes (Signed)
Pt presents via pov from home with continuing bilateral numbness and swelling in his upper extremities. Pt states he has been to ED several times over the last few weeks with the same symptoms and no one has been able to tell him what is going on. He has followed up with his pcp and is supposed to return this week. He states this all started with being prescribed hctz when he went to them for his back pain and had htn. He reports being here for his back, which "wants to lock up" and his swelling in his feet and legs. He also reports that he is urinating frequently. Pt alert & oriented with NAD noted.

## 2019-06-15 NOTE — Addendum Note (Signed)
Addended by: [name] on: 06/15/2019 08:13 AM   Modules accepted: Orders

## 2019-06-15 NOTE — Addendum Note (Signed)
Addended by: Waunita Schooner R on: 06/15/2019 10:20 AM   Modules accepted: Orders

## 2019-06-16 ENCOUNTER — Ambulatory Visit (INDEPENDENT_AMBULATORY_CARE_PROVIDER_SITE_OTHER)
Admission: RE | Admit: 2019-06-16 | Discharge: 2019-06-16 | Disposition: A | Discharge status: Home / Self Care | Payer: BC Managed Care – PPO | Source: Ambulatory Visit | Attending: Family Medicine | Admitting: Family Medicine

## 2019-06-16 ENCOUNTER — Encounter: Payer: Self-pay | Admitting: Family Medicine

## 2019-06-16 ENCOUNTER — Other Ambulatory Visit: Payer: Self-pay

## 2019-06-16 ENCOUNTER — Other Ambulatory Visit (INDEPENDENT_AMBULATORY_CARE_PROVIDER_SITE_OTHER): Payer: BC Managed Care – PPO

## 2019-06-16 ENCOUNTER — Telehealth: Payer: Self-pay | Admitting: Family Medicine

## 2019-06-16 DIAGNOSIS — R7989 Other specified abnormal findings of blood chemistry: Secondary | ICD-10-CM

## 2019-06-16 DIAGNOSIS — J9859 Other diseases of mediastinum, not elsewhere classified: Secondary | ICD-10-CM

## 2019-06-16 DIAGNOSIS — R945 Abnormal results of liver function studies: Secondary | ICD-10-CM | POA: Diagnosis not present

## 2019-06-16 DIAGNOSIS — R918 Other nonspecific abnormal finding of lung field: Secondary | ICD-10-CM | POA: Diagnosis not present

## 2019-06-16 MED ORDER — IOHEXOL 300 MG/ML  SOLN
80.0000 mL | Freq: Once | INTRAMUSCULAR | Status: AC | PRN
  Administered 2019-06-16: 80 mL via INTRAVENOUS

## 2019-06-16 NOTE — Telephone Encounter (Signed)
Reviewed XR from the ER with mediastinal mass noted and recommended CT Chest w/ contrast.   Pt with b/l upper extremity swelling x a few weeks. Will get stat imaging as pt notes getting worse

## 2019-06-17 ENCOUNTER — Other Ambulatory Visit: Payer: BC Managed Care – PPO

## 2019-06-17 ENCOUNTER — Encounter: Payer: Self-pay | Admitting: Family Medicine

## 2019-06-17 LAB — IBC PANEL
Iron: 76 ug/dL (ref 42–165)
Saturation Ratios: 20.7 % (ref 20.0–50.0)
Transferrin: 262 mg/dL (ref 212.0–360.0)

## 2019-06-17 LAB — HEPATITIS C ANTIBODY
Hepatitis C Ab: NONREACTIVE
SIGNAL TO CUT-OFF: 0.01 (ref <1.00)

## 2019-06-17 LAB — HEPATITIS B SURFACE ANTIBODY, QUANTITATIVE: Hep B S AB Quant (Post): 200 m[IU]/mL (ref >=10)

## 2019-06-17 LAB — FERRITIN: Ferritin: 182.7 ng/mL (ref 22.0–322.0)

## 2019-06-17 LAB — HEPATITIS B CORE ANTIBODY, TOTAL: Hep B Core Total Ab: NONREACTIVE

## 2019-06-17 LAB — HEPATITIS B SURFACE ANTIGEN: Hepatitis B Surface Ag: NONREACTIVE

## 2019-06-17 NOTE — Telephone Encounter (Signed)
Please see message from patient. Please help set up P.T.

## 2019-06-18 ENCOUNTER — Encounter: Payer: Self-pay | Admitting: Family Medicine

## 2019-06-18 ENCOUNTER — Other Ambulatory Visit: Payer: Self-pay

## 2019-06-18 ENCOUNTER — Telehealth: Payer: Self-pay

## 2019-06-18 ENCOUNTER — Ambulatory Visit
Admission: RE | Admit: 2019-06-18 | Discharge: 2019-06-18 | Disposition: A | Discharge status: Home / Self Care | Payer: BC Managed Care – PPO | Source: Ambulatory Visit | Attending: Family Medicine | Admitting: Family Medicine

## 2019-06-18 DIAGNOSIS — R7989 Other specified abnormal findings of blood chemistry: Secondary | ICD-10-CM

## 2019-06-18 DIAGNOSIS — R748 Abnormal levels of other serum enzymes: Secondary | ICD-10-CM | POA: Diagnosis not present

## 2019-06-18 DIAGNOSIS — Z20822 Contact with and (suspected) exposure to covid-19: Secondary | ICD-10-CM

## 2019-06-18 NOTE — Telephone Encounter (Signed)
Patient called. Asking if he should get re tested for COVID and wants to do the blood test for it if that would be a good idea? Recommendations on that? Patient was tested for COVID over a month ago and was negative but states some of his symptoms he feels like could come from this if he is positive or if he had it in the past. Right now patient has some sinus issues but also his taste sense is off some. He just feels like it would not hurt to re check but thinks blood test would be better. 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 Your thoughts? Patient asked to mychart him with answers, thoughts and recommendations. Patient is off work today. Thank you

## 2019-06-20 LAB — NOVEL CORONAVIRUS, NAA: SARS-CoV-2, NAA: NOT DETECTED

## 2019-06-25 ENCOUNTER — Ambulatory Visit: Payer: BC Managed Care – PPO | Admitting: Family Medicine

## 2019-06-25 ENCOUNTER — Other Ambulatory Visit: Payer: Self-pay

## 2019-06-25 ENCOUNTER — Ambulatory Visit (INDEPENDENT_AMBULATORY_CARE_PROVIDER_SITE_OTHER): Payer: BC Managed Care – PPO | Admitting: Family Medicine

## 2019-06-25 ENCOUNTER — Encounter: Payer: Self-pay | Admitting: Family Medicine

## 2019-06-25 VITALS — BP 110/80 | HR 75 | Temp 98.0°F | Resp 16 | Ht 72.0 in | Wt 284.8 lb

## 2019-06-25 DIAGNOSIS — R5383 Other fatigue: Secondary | ICD-10-CM

## 2019-06-25 DIAGNOSIS — M5440 Lumbago with sciatica, unspecified side: Secondary | ICD-10-CM

## 2019-06-25 DIAGNOSIS — M545 Low back pain: Secondary | ICD-10-CM | POA: Diagnosis not present

## 2019-06-25 DIAGNOSIS — G4733 Obstructive sleep apnea (adult) (pediatric): Secondary | ICD-10-CM

## 2019-06-25 DIAGNOSIS — N179 Acute kidney failure, unspecified: Secondary | ICD-10-CM

## 2019-06-25 DIAGNOSIS — R809 Proteinuria, unspecified: Secondary | ICD-10-CM

## 2019-06-25 DIAGNOSIS — R81 Glycosuria: Secondary | ICD-10-CM | POA: Diagnosis not present

## 2019-06-25 DIAGNOSIS — R200 Anesthesia of skin: Secondary | ICD-10-CM

## 2019-06-25 LAB — POCT URINALYSIS DIPSTICK
Bilirubin, UA: NEGATIVE
Blood, UA: NEGATIVE
Glucose, UA: POSITIVE — AB
Ketones, UA: NEGATIVE
Leukocytes, UA: NEGATIVE
Nitrite, UA: NEGATIVE
Protein, UA: POSITIVE — AB
Spec Grav, UA: >=1.03 — AB (ref 1.010–1.025)
Urobilinogen, UA: 0.2 E.U./dL
pH, UA: 6 (ref 5.0–8.0)

## 2019-06-25 LAB — VITAMIN D 25 HYDROXY (VIT D DEFICIENCY, FRACTURES): VITD: 21.67 ng/mL — ABNORMAL LOW (ref 30.00–100.00)

## 2019-06-25 LAB — GLUCOSE, RANDOM: Glucose, Bld: 97 mg/dL (ref 70–99)

## 2019-06-25 NOTE — Addendum Note (Signed)
Addended by: Ellamae Sia on: 06/25/2019 11:42 AM   Modules accepted: Orders

## 2019-06-25 NOTE — Assessment & Plan Note (Signed)
Exam and hx seem concerning for possible spinal stenosis. No red flag symptoms. Discussed that we could do imaging now vs PT. Pt elected to do a trial of PT. He will let me know if symptoms worsen and he would like to move forward with imaging.

## 2019-06-25 NOTE — Progress Notes (Signed)
Subjective:     Carlos Wheeler is a 38 y.o. male presenting for Follow-up     HPI   #Back pain - intermittent - worse with straight and bending back - improved with bending over (shopping cart) - occasionally travels down the legs  #Swelling - yesterday was a good day - will also be intermittent - sometimes worse with certain activities - but other times is just random - tingling improves when the swelling goes down   Sleeping more recently Family with vit d deficiency    Review of Systems  Constitutional: Negative for chills and fever.  Cardiovascular: Negative for chest pain.  Musculoskeletal: Positive for back pain.       Arm/hand swelling  Neurological: Positive for numbness.     Social History   Tobacco Use  Smoking Status Never Smoker  Smokeless Tobacco Never Used        Objective:    BP Readings from Last 3 Encounters:  06/25/19 110/80  06/14/19 120/70  06/09/19 122/88   Wt Readings from Last 3 Encounters:  06/25/19 284 lb 12 oz (129.2 kg)  06/14/19 280 lb (127 kg)  06/09/19 283 lb (128.4 kg)    BP 110/80   Pulse 75   Temp 98 F (36.7 C)   Resp 16   Ht 6' (1.829 m)   Wt 284 lb 12 oz (129.2 kg)   SpO2 95%   BMI 38.62 kg/m    Physical Exam Constitutional:      Appearance: Normal appearance. He is not ill-appearing or diaphoretic.  HENT:     Right Ear: External ear normal.     Left Ear: External ear normal.     Nose: Nose normal.  Eyes:     General: No scleral icterus.    Extraocular Movements: Extraocular movements intact.     Conjunctiva/sclera: Conjunctivae normal.  Neck:     Musculoskeletal: Neck supple.  Cardiovascular:     Rate and Rhythm: Normal rate.  Pulmonary:     Effort: Pulmonary effort is normal.  Musculoskeletal:     Comments: Spine:  Inspection: no swelling, deformity Palpation: Some tingling sensation with cervical spine palpation ROM: normal neck ROM w/o pain. Normal Lumbar/Thoracic ROM though  with pain with extension and upright position Strength: normal LE and UE strength testing Negative straight leg raise.   Hands:  B/l swelling of the hands Normal wrist/elbow ROM Normal hand strength Neg Phalen and neg Tinel (at wrist and elbow b/l)  Skin:    General: Skin is warm and dry.  Neurological:     Mental Status: He is alert. Mental status is at baseline.  Psychiatric:        Mood and Affect: Mood normal.           Assessment & Plan:   Problem List Items Addressed This Visit      Respiratory   Obstructive sleep apnea of adult - Primary     Genitourinary   Acute kidney injury ([facility])    Still suspect his kidney damage may be the leading cause for the swelling. Repeat Urine Dip with protein. Will get Urine P:C. Already has nephrology appt.      Relevant Orders   Urinalysis Dipstick (Completed)   Protein, urine, random     Other   Low back pain    Exam and hx seem concerning for possible spinal stenosis. No red flag symptoms. Discussed that we could do imaging now vs PT. Pt elected to do a  trial of PT. He will let me know if symptoms worsen and he would like to move forward with imaging.       Hand numbness    Carpal tunnel assessment negative. Suspect that since this improves when swelling is low, this is likely 2/2 to swelling. Encouraged keeping diary to try and find triggers for worsening swelling. Nephrology f/u       Other Visit Diagnoses    Other fatigue       Relevant Orders   Vitamin D, 25-hydroxy   Proteinuria, unspecified type       Relevant Orders   Protein, urine, random   Creatinine, Urine   Glucosuria       Relevant Orders   Glucose     >25 minutes spent in face to face time with patient, >50% spent in counselling or coordination of care   Return in about 6 weeks (around 08/06/2019).  [name], MD

## 2019-06-25 NOTE — Patient Instructions (Addendum)
#  Edema/Swelling - Good news, it does not look like you have carpal tunnel or a nerve compression - I think the numbness/tingling feeling is related to the swelling  --- Urine test today to look for protein --- Go Nephrologist to see what their thoughts --- Keep a journal -- to see what may be helping/hurting the swelling   #Back pain - I think this may be spinal stenosis - do physical therapy for 6 weeks - if not improvement - let me know and we will get an MRI of your back

## 2019-06-25 NOTE — Assessment & Plan Note (Signed)
Carpal tunnel assessment negative. Suspect that since this improves when swelling is low, this is likely 2/2 to swelling. Encouraged keeping diary to try and find triggers for worsening swelling. Nephrology f/u

## 2019-06-25 NOTE — Assessment & Plan Note (Signed)
Still suspect his kidney damage may be the leading cause for the swelling. Repeat Urine Dip with protein. Will get Urine P:C. Already has nephrology appt.

## 2019-06-26 ENCOUNTER — Encounter: Payer: Self-pay | Admitting: Family Medicine

## 2019-06-26 DIAGNOSIS — M549 Dorsalgia, unspecified: Secondary | ICD-10-CM

## 2019-06-26 DIAGNOSIS — E559 Vitamin D deficiency, unspecified: Secondary | ICD-10-CM

## 2019-06-26 DIAGNOSIS — R200 Anesthesia of skin: Secondary | ICD-10-CM

## 2019-06-26 DIAGNOSIS — M5440 Lumbago with sciatica, unspecified side: Secondary | ICD-10-CM

## 2019-06-26 LAB — PROTEIN, URINE, RANDOM: Total Protein, Urine: 7 mg/dL (ref 5–25)

## 2019-06-26 LAB — CREATININE, URINE, RANDOM: Creatinine, Urine: 261 mg/dL (ref 20–320)

## 2019-06-29 MED ORDER — VITAMIN D (ERGOCALCIFEROL) 1.25 MG (50000 UNIT) PO CAPS: 50000.0000 [IU] | ORAL_CAPSULE | ORAL | 1 refills | Status: DC

## 2019-07-01 DIAGNOSIS — M5440 Lumbago with sciatica, unspecified side: Secondary | ICD-10-CM | POA: Diagnosis not present

## 2019-07-01 DIAGNOSIS — M545 Low back pain: Secondary | ICD-10-CM | POA: Diagnosis not present

## 2019-07-01 NOTE — Addendum Note (Signed)
Addended by: [name] on: 07/01/2019 08:24 AM   Modules accepted: Orders

## 2019-07-13 ENCOUNTER — Encounter: Payer: Self-pay | Admitting: Family Medicine

## 2019-07-13 DIAGNOSIS — R200 Anesthesia of skin: Secondary | ICD-10-CM

## 2019-07-13 NOTE — Telephone Encounter (Signed)
MyChart response to patient.   Routing to referral coordinators to see if we can move up the MRI.

## 2019-07-16 DIAGNOSIS — N179 Acute kidney failure, unspecified: Secondary | ICD-10-CM | POA: Diagnosis not present

## 2019-07-16 NOTE — Addendum Note (Signed)
Addended by: Waunita Schooner R on: 07/16/2019 01:56 PM   Modules accepted: Orders

## 2019-07-17 DIAGNOSIS — G4733 Obstructive sleep apnea (adult) (pediatric): Secondary | ICD-10-CM | POA: Diagnosis not present

## 2019-07-19 ENCOUNTER — Other Ambulatory Visit: Payer: Self-pay | Admitting: Family Medicine

## 2019-07-19 DIAGNOSIS — E559 Vitamin D deficiency, unspecified: Secondary | ICD-10-CM

## 2019-07-20 ENCOUNTER — Encounter: Payer: Self-pay | Admitting: Family Medicine

## 2019-07-20 DIAGNOSIS — D649 Anemia, unspecified: Secondary | ICD-10-CM

## 2019-07-20 NOTE — Telephone Encounter (Signed)
Per note on 06/29/2019 patient is to only take 8 weeks worth of RX vitamin D and then change to OTC. No need for more refills at this time. Denying this request

## 2019-07-22 ENCOUNTER — Other Ambulatory Visit (INDEPENDENT_AMBULATORY_CARE_PROVIDER_SITE_OTHER): Payer: BC Managed Care – PPO

## 2019-07-22 ENCOUNTER — Other Ambulatory Visit: Payer: Self-pay

## 2019-07-22 DIAGNOSIS — D649 Anemia, unspecified: Secondary | ICD-10-CM

## 2019-07-22 NOTE — Telephone Encounter (Signed)
Dr. Einar Pheasant I replied to patient to let him know you are not here this afternoon already. Sending you his other questions for review. Thank you

## 2019-07-23 ENCOUNTER — Encounter: Payer: Self-pay | Admitting: Family Medicine

## 2019-07-23 LAB — FERRITIN: Ferritin: 191.6 ng/mL (ref 22.0–322.0)

## 2019-07-23 LAB — VITAMIN B12: Vitamin B-12: 304 pg/mL (ref 211–911)

## 2019-07-25 ENCOUNTER — Ambulatory Visit
Admission: RE | Admit: 2019-07-25 | Discharge: 2019-07-25 | Disposition: A | Discharge status: Home / Self Care | Payer: BC Managed Care – PPO | Source: Ambulatory Visit | Attending: Family Medicine | Admitting: Family Medicine

## 2019-07-25 ENCOUNTER — Other Ambulatory Visit: Payer: Self-pay

## 2019-07-25 DIAGNOSIS — M4802 Spinal stenosis, cervical region: Secondary | ICD-10-CM | POA: Diagnosis not present

## 2019-07-25 DIAGNOSIS — M5440 Lumbago with sciatica, unspecified side: Secondary | ICD-10-CM

## 2019-07-25 DIAGNOSIS — M48061 Spinal stenosis, lumbar region without neurogenic claudication: Secondary | ICD-10-CM | POA: Diagnosis not present

## 2019-07-25 DIAGNOSIS — M549 Dorsalgia, unspecified: Secondary | ICD-10-CM

## 2019-07-25 DIAGNOSIS — R200 Anesthesia of skin: Secondary | ICD-10-CM

## 2019-07-27 ENCOUNTER — Encounter: Payer: Self-pay | Admitting: Family Medicine

## 2019-07-27 DIAGNOSIS — M5126 Other intervertebral disc displacement, lumbar region: Secondary | ICD-10-CM

## 2019-07-27 NOTE — Telephone Encounter (Signed)
See other mychart messages

## 2019-07-30 ENCOUNTER — Other Ambulatory Visit: Payer: Self-pay | Admitting: Family Medicine

## 2019-07-30 ENCOUNTER — Other Ambulatory Visit: Payer: Self-pay | Admitting: Nephrology

## 2019-07-30 DIAGNOSIS — N179 Acute kidney failure, unspecified: Secondary | ICD-10-CM

## 2019-07-30 DIAGNOSIS — R7989 Other specified abnormal findings of blood chemistry: Secondary | ICD-10-CM

## 2019-07-30 NOTE — Progress Notes (Signed)
Labs ordered.

## 2019-07-31 ENCOUNTER — Other Ambulatory Visit: Payer: Self-pay | Admitting: Family Medicine

## 2019-08-03 ENCOUNTER — Other Ambulatory Visit: Payer: Self-pay | Admitting: Family Medicine

## 2019-08-03 ENCOUNTER — Other Ambulatory Visit: Payer: Self-pay | Admitting: Physician Assistant

## 2019-08-03 DIAGNOSIS — N179 Acute kidney failure, unspecified: Secondary | ICD-10-CM | POA: Diagnosis not present

## 2019-08-03 DIAGNOSIS — R768 Other specified abnormal immunological findings in serum: Secondary | ICD-10-CM | POA: Diagnosis not present

## 2019-08-03 DIAGNOSIS — R030 Elevated blood-pressure reading, without diagnosis of hypertension: Secondary | ICD-10-CM | POA: Diagnosis not present

## 2019-08-03 DIAGNOSIS — M545 Low back pain: Secondary | ICD-10-CM | POA: Diagnosis not present

## 2019-08-03 DIAGNOSIS — M47819 Spondylosis without myelopathy or radiculopathy, site unspecified: Secondary | ICD-10-CM | POA: Diagnosis not present

## 2019-08-03 DIAGNOSIS — G8929 Other chronic pain: Secondary | ICD-10-CM

## 2019-08-04 ENCOUNTER — Telehealth: Payer: Self-pay

## 2019-08-04 NOTE — Telephone Encounter (Signed)
Ok with me 

## 2019-08-04 NOTE — Telephone Encounter (Signed)
Copied from [place]. Topic: Appointment Scheduling - Transfer of Care >> Aug 04, 2019 11:52 AM Greggory Keen D wrote: Pt is requesting to transfer FROM: Dr. Waunita Schooner Pt is requesting to transfer TO: Dr. Domingo Mend Reason for requested transfer: not satisfied with care at [facility] to patient's current PCP (transferring FROM).

## 2019-08-05 NOTE — Telephone Encounter (Signed)
TOC virtual appointment scheduled

## 2019-08-08 ENCOUNTER — Other Ambulatory Visit: Payer: Self-pay | Admitting: Family Medicine

## 2019-08-08 DIAGNOSIS — F419 Anxiety disorder, unspecified: Secondary | ICD-10-CM

## 2019-08-10 ENCOUNTER — Ambulatory Visit
Admission: RE | Admit: 2019-08-10 | Discharge: 2019-08-10 | Disposition: A | Discharge status: Home / Self Care | Payer: BC Managed Care – PPO | Source: Ambulatory Visit | Attending: Nephrology | Admitting: Nephrology

## 2019-08-10 ENCOUNTER — Encounter: Payer: Self-pay | Admitting: Family Medicine

## 2019-08-10 ENCOUNTER — Other Ambulatory Visit: Payer: Self-pay

## 2019-08-10 DIAGNOSIS — N179 Acute kidney failure, unspecified: Secondary | ICD-10-CM | POA: Diagnosis not present

## 2019-08-10 NOTE — Telephone Encounter (Signed)
Dr. Einar Pheasant, in refill request you said patient needs an appointment. Does he need an appointment here or with new PCP? Please review patient's message above. Thank you

## 2019-08-10 NOTE — Telephone Encounter (Signed)
Patient is seen new provider on 08/19/2019

## 2019-08-10 NOTE — Telephone Encounter (Signed)
See other message

## 2019-08-11 ENCOUNTER — Ambulatory Visit (INDEPENDENT_AMBULATORY_CARE_PROVIDER_SITE_OTHER): Payer: BC Managed Care – PPO | Admitting: Family Medicine

## 2019-08-11 ENCOUNTER — Encounter: Payer: Self-pay | Admitting: Family Medicine

## 2019-08-11 ENCOUNTER — Other Ambulatory Visit: Payer: BC Managed Care – PPO

## 2019-08-11 DIAGNOSIS — N179 Acute kidney failure, unspecified: Secondary | ICD-10-CM | POA: Diagnosis not present

## 2019-08-11 DIAGNOSIS — F419 Anxiety disorder, unspecified: Secondary | ICD-10-CM | POA: Diagnosis not present

## 2019-08-11 DIAGNOSIS — M5440 Lumbago with sciatica, unspecified side: Secondary | ICD-10-CM | POA: Diagnosis not present

## 2019-08-11 MED ORDER — LORAZEPAM 1 MG PO TABS: 1.0000 mg | ORAL_TABLET | Freq: Every day | ORAL | 0 refills | Status: DC

## 2019-08-11 NOTE — Assessment & Plan Note (Signed)
Has some facet join arthritis and getting steroid injection tomorrow. Feels anxiety may be contributing to worsening symptoms. Cont with specialty care and can take lorazepam in anticipation of procedural anxiety

## 2019-08-11 NOTE — Assessment & Plan Note (Addendum)
Pt is in the process of switching PCPs, PDMP reviewed, last fill 09/2018. He seems to be realizing that his anxiety may be worsening his symptoms. When we met in February he was resistant to daily medication and had tried in the past. However, I strongly advised he consider this as anxiety has been worsening over the last few months. Encouraged him to discuss with new provider next week. #10 lorazepam filled as bridge

## 2019-08-11 NOTE — Assessment & Plan Note (Addendum)
Reviewed nephrology note. ANA elevated, but they had low concern for autoimmune at this time. Has renal US scheduled.

## 2019-08-11 NOTE — Progress Notes (Signed)
I connected with Gevin Perea Noone on 08/11/19 at 10:00 AM EST by video and verified that I am speaking with the correct person using two identifiers.   I discussed the limitations, risks, security and privacy concerns of performing an evaluation and management service by video/Telephone and the availability of in person appointments. I also discussed with the patient that there may be a patient responsible charge related to this service. The patient expressed understanding and agreed to proceed.  Patient location: Home Provider Location: [place] Participants: [name] and Geri Seminole Hartney   Subjective:     KIER SMEAD is a 38 y.o. male presenting for Medication Management (refill on Lorazepam)     HPI   #Anxiety - was on a steroid pack - stopped previously due to anxiety - had lorazepam which he was taking to help with the steroid treatment - was taking 1/2 tablet - the last few month has been though so much - having a lot of anxiety - feeling pins and needles and things seem to be worse - things seem to get better when he takes his medication - wondering if stress will make it his symptoms worse - will do a lot of research to try to figure out what is causing his symptoms - when he is calm the swelling in his hands seems to improve - he is beginning  Seeing the kidney doctor - told they didn't think it was lupus - but had some positive antibodies - available in care everywhere - reviewed nephrology note - no other symptoms to support diagnosis - drink water/avoid NSAIDs return in 6 weeks  Did a lot of research on the vitamin D   Doing a lot of research on the Internet - MS, ALS and worried about what could be causing his symptoms and causing additional stress -- felt like the lorazepam helped calm him down  Feels best in the morning - has not stressed himself with what is on the internet  Back pain - is getting a steroid injection  tomorrow    Review of Systems See HPI  Social History   Tobacco Use  Smoking Status Never Smoker  Smokeless Tobacco Never Used        Objective:   BP Readings from Last 3 Encounters:  06/25/19 110/80  06/14/19 120/70  06/09/19 122/88   Wt Readings from Last 3 Encounters:  06/25/19 284 lb 12 oz (129.2 kg)  06/14/19 280 lb (127 kg)  06/09/19 283 lb (128.4 kg)   There were no vitals taken for this visit.   Phone Exam Somewhat anxious Speaking in complete sentences No respiratory distress Alert and oriented         Assessment & Plan:   Problem List Items Addressed This Visit      Genitourinary   Acute kidney injury ([place]) - Primary    Reviewed nephrology note. ANA elevated, but they had low concern for autoimmune at this time. Has renal US scheduled.         Other   Anxiety    Pt is in the process of switching PCPs, PDMP reviewed, last fill 09/2018. He seems to be realizing that his anxiety may be worsening his symptoms. When we met in February he was resistant to daily medication and had tried in the past. However, I strongly advised he consider this as anxiety has been worsening over the last few months. Encouraged him to discuss with new provider next week. #  10 lorazepam filled as bridge      Relevant Medications   LORazepam (ATIVAN) 1 MG tablet   Low back pain    Has some facet join arthritis and getting steroid injection tomorrow. Feels anxiety may be contributing to worsening symptoms. Cont with specialty care and can take lorazepam in anticipation of procedural anxiety        Interactive audio and video telecommunications were attempted between this provider and patient, however failed, due to patient having technical difficulties OR patient did not have access to video capability.  We continued and completed visit with audio only.   Start Time: 10:07 End Time: 10:27     Return in about 1 week (around 08/18/2019) for with new PCP.  [name], MD

## 2019-08-12 ENCOUNTER — Ambulatory Visit
Admission: RE | Admit: 2019-08-12 | Discharge: 2019-08-12 | Disposition: A | Discharge status: Home / Self Care | Payer: BC Managed Care – PPO | Source: Ambulatory Visit | Attending: Physician Assistant | Admitting: Physician Assistant

## 2019-08-12 ENCOUNTER — Other Ambulatory Visit: Payer: Self-pay | Admitting: Physician Assistant

## 2019-08-12 DIAGNOSIS — M545 Low back pain, unspecified: Secondary | ICD-10-CM

## 2019-08-12 DIAGNOSIS — G8929 Other chronic pain: Secondary | ICD-10-CM

## 2019-08-12 MED ORDER — METHYLPREDNISOLONE ACETATE 40 MG/ML INJ SUSP (RADIOLOG: 120.0000 mg | Freq: Once | INTRAMUSCULAR | Status: DC

## 2019-08-12 MED ORDER — IOPAMIDOL (ISOVUE-M 200) INJECTION 41%: 1.0000 mL | Freq: Once | INTRAMUSCULAR | Status: DC

## 2019-08-12 NOTE — Discharge Instructions (Signed)

## 2019-08-14 ENCOUNTER — Other Ambulatory Visit: Payer: Self-pay | Admitting: Family Medicine

## 2019-08-14 DIAGNOSIS — E559 Vitamin D deficiency, unspecified: Secondary | ICD-10-CM

## 2019-08-19 ENCOUNTER — Encounter: Payer: Self-pay | Admitting: Internal Medicine

## 2019-08-19 ENCOUNTER — Telehealth (INDEPENDENT_AMBULATORY_CARE_PROVIDER_SITE_OTHER): Payer: 59 | Admitting: Internal Medicine

## 2019-08-19 ENCOUNTER — Other Ambulatory Visit: Payer: Self-pay

## 2019-08-20 ENCOUNTER — Encounter: Payer: Self-pay | Admitting: Internal Medicine

## 2019-08-20 ENCOUNTER — Telehealth: Payer: Self-pay | Admitting: Nurse Practitioner

## 2019-08-20 ENCOUNTER — Other Ambulatory Visit: Payer: Self-pay

## 2019-08-20 ENCOUNTER — Ambulatory Visit (INDEPENDENT_AMBULATORY_CARE_PROVIDER_SITE_OTHER): Payer: 59 | Admitting: Internal Medicine

## 2019-08-20 VITALS — BP 110/76 | HR 74 | Temp 97.8°F | Wt 217.4 lb

## 2019-08-20 DIAGNOSIS — E669 Obesity, unspecified: Secondary | ICD-10-CM

## 2019-08-20 DIAGNOSIS — R7309 Other abnormal glucose: Secondary | ICD-10-CM | POA: Diagnosis not present

## 2019-08-20 DIAGNOSIS — R7989 Other specified abnormal findings of blood chemistry: Secondary | ICD-10-CM

## 2019-08-20 DIAGNOSIS — N179 Acute kidney failure, unspecified: Secondary | ICD-10-CM

## 2019-08-20 DIAGNOSIS — F419 Anxiety disorder, unspecified: Secondary | ICD-10-CM

## 2019-08-20 DIAGNOSIS — Z87898 Personal history of other specified conditions: Secondary | ICD-10-CM

## 2019-08-20 DIAGNOSIS — G4733 Obstructive sleep apnea (adult) (pediatric): Secondary | ICD-10-CM

## 2019-08-20 DIAGNOSIS — M5126 Other intervertebral disc displacement, lumbar region: Secondary | ICD-10-CM

## 2019-08-20 DIAGNOSIS — R5383 Other fatigue: Secondary | ICD-10-CM

## 2019-08-20 LAB — POCT GLYCOSYLATED HEMOGLOBIN (HGB A1C): Hemoglobin A1C: 5.7 % — AB (ref 4.0–5.6)

## 2019-08-20 NOTE — Patient Instructions (Signed)
-  Nice seeing you today!!  -Lab work today; will notify you once results are available.  -Start Zoloft 50 mg daily.  -Schedule a 6 week follow up.

## 2019-08-20 NOTE — Progress Notes (Signed)
Appointment has been rescheduled due to issues with video platform.  Peggye Pitt, MD [facility] at [facility]

## 2019-08-20 NOTE — Progress Notes (Signed)
Established Patient Office Visit     This visit occurred during the SARS-CoV-2 public health emergency.  Safety protocols were in place, including screening questions prior to the visit, additional usage of staff PPE, and extensive cleaning of exam room while observing appropriate contact time as indicated for disinfecting solutions.    CC/Reason for Visit: Establish care, discuss chronic medical conditions He used to see Dr. Einar Pheasant with [place] [facility] But is  transferring care to [place].  HPI: Carlos Wheeler is a 39 y.o. male who is coming in today for the above mentioned reasons. Past Medical History is significant for: Obstructive sleep apnea on nightly CPAP, significant alcohol abuse for which he said he quit 3 months ago but is still drinking red wine 2-3 glasses  a day.  He also has a herniated lumbar disc and has recently been on systemic steroids and epidural steroid injections.  After these he has noted some elevated blood sugars, irritability, tremors and hyperactivity.  He has also been having difficulty sleeping.  He was put on hydrochlorothiazide over the fall, to the best of my understanding for may be lower extremity edema, subsequently to this he developed acute renal failure.  He also has transaminitis which he attributes to hydrochlorothiazide but I believe this is not the case.  May be fatty liver versus alcohol.  He also has a history of vitamin D deficiency and generalized anxiety disorder on lorazepam only.  He feels like the lorazepam alone is not helpful.  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   Past Medical/Surgical History: Past Medical History:  Diagnosis Date  . Anxiety   . Asthma   . GERD (gastroesophageal reflux disease)   . Sleep apnea     Past Surgical History:  Procedure Laterality Date  . NO PAST SURGERIES       Social History:  reports that he has never smoked. He has never used smokeless tobacco. He reports previous alcohol use of about 2.0 standard drinks of alcohol per week. He reports that he does not use drugs.  Allergies: Allergies  Allergen Reactions  . Amoxicillin Nausea And Vomiting  . Montelukast Other (See Comments)    Causes forgetfulness     Family History:  Family History  Problem Relation Age of Onset  . Hyperlipidemia Mother   . Hypertension Mother   . Heart attack Father 5  . Heart disease Father   . Hypertension Father   . Diabetes Sister   . Hyperlipidemia Sister   . Hypertension Sister   . Diabetes Maternal Grandmother   . Hypertension Maternal Grandmother   . Diabetes Maternal Grandfather   . Hypertension Maternal Grandfather   . Heart attack Maternal Grandfather 70  . Diabetes Paternal Grandmother   . Stroke Paternal Grandmother 53  . Diabetes Paternal Grandfather   . Cancer Neg Hx      Current Outpatient Medications:  .  albuterol (VENTOLIN HFA) 108 (90 Base) MCG/ACT inhaler, Inhale 1-2 puffs into the lungs every 6 (six) hours as needed., Disp: 8 g, Rfl: 2 .  LORazepam (ATIVAN) 1 MG tablet, Take 1 tablet (1 mg total) by mouth at bedtime., Disp: 10 tablet, Rfl: 0 .  Vitamin D, Ergocalciferol, (DRISDOL) 1.25 MG (50000 UT) CAPS capsule, Take 1 capsule (50,000 Units total) by mouth every 7 (seven) days., Disp: 4  capsule, Rfl: 1  Review of Systems:  Constitutional: Denies fever, chills, diaphoresis, appetite change. HEENT: Denies photophobia, eye pain, redness, hearing loss, ear pain, congestion, sore throat, rhinorrhea, sneezing, mouth sores, trouble swallowing, neck pain, neck stiffness and tinnitus.   Respiratory: Denies SOB, DOE, cough, chest tightness,  and wheezing.   Cardiovascular: Denies chest pain, palpitations and leg swelling.  Gastrointestinal: Denies nausea, vomiting, abdominal pain, diarrhea, constipation, blood in stool and abdominal  distention.  Genitourinary: Denies dysuria, urgency, frequency, hematuria, flank pain and difficulty urinating.  Endocrine: Denies: hot or cold intolerance, sweats, changes in hair or nails, polyuria, polydipsia. Musculoskeletal: Denies myalgias, back pain, joint swelling, arthralgias and gait problem.  Skin: Denies pallor, rash and wound.  Neurological: Denies dizziness, seizures, syncope, weakness, light-headedness, numbness and headaches.  Hematological: Denies adenopathy. Easy bruising, personal or family bleeding history  Psychiatric/Behavioral: Denies suicidal ideation, mood changes, confusion, nervousness, sleep disturbance and agitation    Physical Exam: Vitals:   08/20/19 1552  BP: 110/76  Pulse: 74  Temp: 97.8 F (36.6 C)  TempSrc: Temporal  SpO2: 97%  Weight: 217 lb 6.4 oz (98.6 kg)    Body mass index is 28.29 kg/m.   Constitutional: NAD, calm, comfortable Eyes: PERRL, lids and conjunctivae normal ENMT: Mucous membranes are moist.  Respiratory: clear to auscultation bilaterally, no wheezing, no crackles. Normal respiratory effort. No accessory muscle use.  Cardiovascular: Regular rate and rhythm, no murmurs / rubs / gallops. No extremity edema.   Skin: Dry skin on lower extremities Neurologic: Grossly intact and nonfocal Psychiatric: Normal judgment and insight. Alert and oriented x 3. Normal mood.    Impression and Plan:  High glucose  -A1c is 5.7 today at most he has mild glucose intolerance. -Suspect elevated CBGs are due to recent courses of steroids due to his herniated disc.  Anxiety -Not well controlled on lorazepam, have discussed habit-forming and drug tolerance with benzodiazepines. -We will start Zoloft 50 mg daily, he will return in 6 weeks for follow-up.  Obstructive sleep apnea of adult -On nightly CPAP  Obesity (BMI 30.0-34.9) -Discussed healthy lifestyle, including increased physical activity and better food choices to promote weight  loss.  Elevated LFTs -Recheck today, further work-up to follow  Acute kidney injury ([facility]) -Presumably from hydrochlorothiazide use, although not clear. -Obtain notes from nephrologist in [place].  History of heavy alcohol consumption -Still drinking 2 to 3 glasses of wine a day  Lumbar herniated disc -Followed by neurosurgery, currently receiving epidural steroid injections.  Fatigue, unspecified type -Check CBC, vitamin B12, vitamin D, kidney and liver function. -Suspect depression/anxiety may also be playing a role, also possibly steroid effect.    Patient Instructions  -Nice seeing you today!!  -Lab work today; will notify you once results are available.  -Start Zoloft 50 mg daily.  -Schedule a 6 week follow up.     Chaya Jan, MD [facility] at [facility]

## 2019-08-21 ENCOUNTER — Ambulatory Visit: Payer: Self-pay | Admitting: Neurology

## 2019-08-21 ENCOUNTER — Ambulatory Visit: Payer: 59 | Attending: Internal Medicine

## 2019-08-21 ENCOUNTER — Other Ambulatory Visit (INDEPENDENT_AMBULATORY_CARE_PROVIDER_SITE_OTHER): Payer: 59

## 2019-08-21 ENCOUNTER — Other Ambulatory Visit: Payer: 59

## 2019-08-21 ENCOUNTER — Other Ambulatory Visit: Payer: Self-pay | Admitting: Internal Medicine

## 2019-08-21 DIAGNOSIS — N179 Acute kidney failure, unspecified: Secondary | ICD-10-CM

## 2019-08-21 DIAGNOSIS — F419 Anxiety disorder, unspecified: Secondary | ICD-10-CM

## 2019-08-21 DIAGNOSIS — Z20822 Contact with and (suspected) exposure to covid-19: Secondary | ICD-10-CM

## 2019-08-21 DIAGNOSIS — R5383 Other fatigue: Secondary | ICD-10-CM | POA: Diagnosis not present

## 2019-08-21 DIAGNOSIS — R7989 Other specified abnormal findings of blood chemistry: Secondary | ICD-10-CM

## 2019-08-21 LAB — COMPREHENSIVE METABOLIC PANEL
ALT: 67 U/L — ABNORMAL HIGH (ref 0–53)
AST: 68 U/L — ABNORMAL HIGH (ref 0–37)
Albumin: 5.4 g/dL — ABNORMAL HIGH (ref 3.5–5.2)
Alkaline Phosphatase: 51 U/L (ref 39–117)
BUN: 27 mg/dL — ABNORMAL HIGH (ref 6–23)
CO2: 33 mEq/L — ABNORMAL HIGH (ref 19–32)
Calcium: 10.4 mg/dL (ref 8.4–10.5)
Chloride: 95 mEq/L — ABNORMAL LOW (ref 96–112)
Creatinine, Ser: 1.67 mg/dL — ABNORMAL HIGH (ref 0.40–1.50)
GFR: 56.44 mL/min — ABNORMAL LOW (ref >60.00)
Glucose, Bld: 106 mg/dL — ABNORMAL HIGH (ref 70–99)
Potassium: 4.1 mEq/L (ref 3.5–5.1)
Sodium: 137 mEq/L (ref 135–145)
Total Bilirubin: 0.8 mg/dL (ref 0.2–1.2)
Total Protein: 7.8 g/dL (ref 6.0–8.3)

## 2019-08-21 LAB — CBC WITH DIFFERENTIAL/PLATELET
Basophils Absolute: 0 10*3/uL (ref 0.0–0.1)
Basophils Relative: 0.3 % (ref 0.0–3.0)
Eosinophils Absolute: 0.1 10*3/uL (ref 0.0–0.7)
Eosinophils Relative: 1.6 % (ref 0.0–5.0)
HCT: 38.2 % — ABNORMAL LOW (ref 39.0–52.0)
Hemoglobin: 12.6 g/dL — ABNORMAL LOW (ref 13.0–17.0)
Lymphocytes Relative: 8.9 % — ABNORMAL LOW (ref 12.0–46.0)
Lymphs Abs: 0.5 10*3/uL — ABNORMAL LOW (ref 0.7–4.0)
MCHC: 33 g/dL (ref 30.0–36.0)
MCV: 94.7 fl (ref 78.0–100.0)
Monocytes Absolute: 0.5 10*3/uL (ref 0.1–1.0)
Monocytes Relative: 8.4 % (ref 3.0–12.0)
Neutro Abs: 5 10*3/uL (ref 1.4–7.7)
Neutrophils Relative %: 80.8 % — ABNORMAL HIGH (ref 43.0–77.0)
Platelets: 189 10*3/uL (ref 150.0–400.0)
RBC: 4.03 Mil/uL — ABNORMAL LOW (ref 4.22–5.81)
RDW: 15.4 % (ref 11.5–15.5)
WBC: 6.2 10*3/uL (ref 4.0–10.5)

## 2019-08-21 LAB — HEMOGLOBIN A1C: Hgb A1c MFr Bld: 6 % (ref 4.6–6.5)

## 2019-08-21 LAB — LIPID PANEL
Cholesterol: 357 mg/dL — ABNORMAL HIGH (ref 0–200)
HDL: 114.7 mg/dL (ref >39.00)
LDL Cholesterol: 222 mg/dL — ABNORMAL HIGH (ref 0–99)
NonHDL: 242.38
Total CHOL/HDL Ratio: 3
Triglycerides: 103 mg/dL (ref 0.0–149.0)
VLDL: 20.6 mg/dL (ref 0.0–40.0)

## 2019-08-21 LAB — VITAMIN B12: Vitamin B-12: 286 pg/mL (ref 211–911)

## 2019-08-21 LAB — TSH: TSH: 66.66 u[IU]/mL — ABNORMAL HIGH (ref 0.35–4.50)

## 2019-08-21 LAB — VITAMIN D 25 HYDROXY (VIT D DEFICIENCY, FRACTURES): VITD: 31.79 ng/mL (ref 30.00–100.00)

## 2019-08-21 MED ORDER — SERTRALINE HCL 50 MG PO TABS: 50.0000 mg | ORAL_TABLET | Freq: Every day | ORAL | 1 refills | Status: DC

## 2019-08-21 NOTE — Progress Notes (Signed)
zoloft

## 2019-08-23 LAB — NOVEL CORONAVIRUS, NAA: SARS-CoV-2, NAA: NOT DETECTED

## 2019-08-24 ENCOUNTER — Telehealth: Payer: Self-pay | Admitting: *Deleted

## 2019-08-24 ENCOUNTER — Encounter: Payer: Self-pay | Admitting: Internal Medicine

## 2019-08-24 ENCOUNTER — Other Ambulatory Visit: Payer: 59

## 2019-08-24 NOTE — Telephone Encounter (Signed)
Copied from CRM [id number]. Topic: General - Other >> Aug 24, 2019  2:31 PM Wyonia Hough E wrote: Reason for CRM: Pt has sent mychart messages and would like a call today to ask questions about his lab results/ please advise   Clinic RN called patient to let them his results are in but has not been reviewed by PCP. Patient reports he seen them in MyChart. Informed patient that once PCP reviews and interpret labs he will receive another call. Patient verbalized understanding.

## 2019-08-25 ENCOUNTER — Encounter: Payer: Self-pay | Admitting: Neurology

## 2019-08-25 ENCOUNTER — Other Ambulatory Visit: Payer: Self-pay | Admitting: Internal Medicine

## 2019-08-25 ENCOUNTER — Telehealth: Payer: Self-pay | Admitting: *Deleted

## 2019-08-25 ENCOUNTER — Ambulatory Visit: Payer: Self-pay | Admitting: Neurology

## 2019-08-25 ENCOUNTER — Encounter: Payer: Self-pay | Admitting: Internal Medicine

## 2019-08-25 DIAGNOSIS — E785 Hyperlipidemia, unspecified: Secondary | ICD-10-CM | POA: Insufficient documentation

## 2019-08-25 DIAGNOSIS — R7302 Impaired glucose tolerance (oral): Secondary | ICD-10-CM | POA: Insufficient documentation

## 2019-08-25 DIAGNOSIS — R7989 Other specified abnormal findings of blood chemistry: Secondary | ICD-10-CM

## 2019-08-25 DIAGNOSIS — E039 Hypothyroidism, unspecified: Secondary | ICD-10-CM | POA: Insufficient documentation

## 2019-08-25 DIAGNOSIS — N1831 Chronic kidney disease, stage 3a: Secondary | ICD-10-CM

## 2019-08-25 DIAGNOSIS — E7800 Pure hypercholesterolemia, unspecified: Secondary | ICD-10-CM

## 2019-08-25 MED ORDER — ATORVASTATIN CALCIUM 40 MG PO TABS: 40.0000 mg | ORAL_TABLET | Freq: Every day | ORAL | 1 refills | Status: DC

## 2019-08-25 MED ORDER — LEVOTHYROXINE SODIUM 50 MCG PO TABS: 50.0000 ug | ORAL_TABLET | Freq: Every day | ORAL | 1 refills | Status: DC

## 2019-08-25 NOTE — Telephone Encounter (Signed)
No showed new patient appointment. 

## 2019-08-26 ENCOUNTER — Telehealth: Payer: Self-pay | Admitting: *Deleted

## 2019-08-26 NOTE — Telephone Encounter (Signed)
Copied from CRM [id number]. Topic: General - Inquiry >> Aug 26, 2019  2:28 PM Leafy Ro wrote: Reason for CRM:pt has sent mychart message and pt is calling to follow up on mychart message concerning zoloft

## 2019-08-26 NOTE — Telephone Encounter (Signed)
Spoke with patient and reviewed lab results. 

## 2019-08-27 ENCOUNTER — Encounter: Payer: Self-pay | Admitting: Internal Medicine

## 2019-09-02 ENCOUNTER — Other Ambulatory Visit: Payer: Self-pay | Admitting: Internal Medicine

## 2019-09-02 DIAGNOSIS — R7989 Other specified abnormal findings of blood chemistry: Secondary | ICD-10-CM

## 2019-09-12 ENCOUNTER — Other Ambulatory Visit: Payer: Self-pay | Admitting: Internal Medicine

## 2019-09-12 DIAGNOSIS — F419 Anxiety disorder, unspecified: Secondary | ICD-10-CM

## 2019-10-07 ENCOUNTER — Other Ambulatory Visit: Payer: Self-pay | Admitting: Neurosurgery

## 2019-10-07 DIAGNOSIS — M47819 Spondylosis without myelopathy or radiculopathy, site unspecified: Secondary | ICD-10-CM

## 2019-10-08 ENCOUNTER — Ambulatory Visit: Payer: 59 | Admitting: Internal Medicine

## 2019-10-13 ENCOUNTER — Ambulatory Visit (INDEPENDENT_AMBULATORY_CARE_PROVIDER_SITE_OTHER): Payer: 59 | Admitting: Family

## 2019-10-13 ENCOUNTER — Ambulatory Visit
Admission: RE | Admit: 2019-10-13 | Discharge: 2019-10-13 | Disposition: A | Discharge status: Home / Self Care | Payer: 59 | Source: Ambulatory Visit | Attending: Neurosurgery | Admitting: Neurosurgery

## 2019-10-13 ENCOUNTER — Other Ambulatory Visit: Payer: Self-pay | Admitting: Neurosurgery

## 2019-10-13 ENCOUNTER — Encounter: Payer: Self-pay | Admitting: Family

## 2019-10-13 ENCOUNTER — Other Ambulatory Visit: Payer: Self-pay

## 2019-10-13 VITALS — BP 120/78 | HR 68 | Temp 97.3°F | Wt 272.8 lb

## 2019-10-13 DIAGNOSIS — M47819 Spondylosis without myelopathy or radiculopathy, site unspecified: Secondary | ICD-10-CM

## 2019-10-13 DIAGNOSIS — D649 Anemia, unspecified: Secondary | ICD-10-CM

## 2019-10-13 DIAGNOSIS — E782 Mixed hyperlipidemia: Secondary | ICD-10-CM | POA: Diagnosis not present

## 2019-10-13 DIAGNOSIS — F419 Anxiety disorder, unspecified: Secondary | ICD-10-CM

## 2019-10-13 DIAGNOSIS — L659 Nonscarring hair loss, unspecified: Secondary | ICD-10-CM

## 2019-10-13 DIAGNOSIS — R5383 Other fatigue: Secondary | ICD-10-CM | POA: Diagnosis not present

## 2019-10-13 DIAGNOSIS — E038 Other specified hypothyroidism: Secondary | ICD-10-CM | POA: Diagnosis not present

## 2019-10-13 LAB — IBC PANEL
Iron: 62 ug/dL (ref 42–165)
Saturation Ratios: 18.4 % — ABNORMAL LOW (ref 20.0–50.0)
Transferrin: 241 mg/dL (ref 212.0–360.0)

## 2019-10-13 LAB — LIPID PANEL
Cholesterol: 218 mg/dL — ABNORMAL HIGH (ref 0–200)
HDL: 88.9 mg/dL (ref >39.00)
LDL Cholesterol: 116 mg/dL — ABNORMAL HIGH (ref 0–99)
NonHDL: 129.5
Total CHOL/HDL Ratio: 2
Triglycerides: 69 mg/dL (ref 0.0–149.0)
VLDL: 13.8 mg/dL (ref 0.0–40.0)

## 2019-10-13 LAB — TSH: TSH: 47.28 u[IU]/mL — ABNORMAL HIGH (ref 0.35–4.50)

## 2019-10-13 MED ORDER — LORAZEPAM 1 MG PO TABS: 1.0000 mg | ORAL_TABLET | Freq: Every day | ORAL | 0 refills | Status: DC

## 2019-10-13 MED ORDER — METHYLPREDNISOLONE ACETATE 40 MG/ML INJ SUSP (RADIOLOG
120.0000 mg | Freq: Once | INTRAMUSCULAR | Status: AC
  Administered 2019-10-13: 120 mg via INTRA_ARTICULAR

## 2019-10-13 MED ORDER — IOPAMIDOL (ISOVUE-M 200) INJECTION 41%
1.0000 mL | Freq: Once | INTRAMUSCULAR | Status: AC
  Administered 2019-10-13: 1 mL via INTRA_ARTICULAR

## 2019-10-13 NOTE — Progress Notes (Signed)
Established Patient Office Visit  Subjective:  Patient ID: Carlos Wheeler, male    DOB: 04/22/1981  Age: 39 y.o. MRN: [id number]  CC:  Chief Complaint  Patient presents with  . Follow-up    HPI [name] presents for a recheck of Hypothyroidism, Hyperlipidemia, Anxiety and elevated LFTs. He reports doing very well since he started taking the Synthroid. Has noticed a significant increase in his energy level, skin is better, and his hair is growing back on his extremities. He is not taking the Lipitor for cholesterol nor the Zoloft to help with anxiety. He reports not really wanting to take anything daily and the medication making have crazy thoughts when he wakes up. The Lipitor makes him feel weird as well. Patient would like to see what his labs look like and he will consider taking the Lipitor. Takes 1/4 ativan about once every 3 days that helps  Past Medical History:  Diagnosis Date  . Anxiety   . Asthma   . GERD (gastroesophageal reflux disease)   . Sleep apnea     Past Surgical History:  Procedure Laterality Date  . NO PAST SURGERIES      Family History  Problem Relation Age of Onset  . Hyperlipidemia Mother   . Hypertension Mother   . Heart attack Father 64  . Heart disease Father   . Hypertension Father   . Diabetes Sister   . Hyperlipidemia Sister   . Hypertension Sister   . Diabetes Maternal Grandmother   . Hypertension Maternal Grandmother   . Diabetes Maternal Grandfather   . Hypertension Maternal Grandfather   . Heart attack Maternal Grandfather 70  . Diabetes Paternal Grandmother   . Stroke Paternal Grandmother 88  . Diabetes Paternal Grandfather   . Cancer Neg Hx     Social History   Socioeconomic History  . Marital status: Married    Spouse name: [place]  . Number of children: 2  . Years of education: MBA  . Highest education level: Not on file  Occupational History  . Not on file  Tobacco Use  . Smoking status: Never  Smoker  . Smokeless tobacco: Never Used  Substance and Sexual Activity  . Alcohol use: Not Currently    Alcohol/week: 2.0 standard drinks    Types: 2 Glasses of wine per week    Comment: quit drinking heavily 2 months ago  . Drug use: No  . Sexual activity: Yes    Birth control/protection: Surgical  Other Topics Concern  . Not on file  Social History Narrative   09/30/18   Lives with wife and 2 children - twin boy (Carnelius) and girl ([name]) - born 2017   Enjoys: drag race, working on cars - trying to start a business   Work: Art gallery manager   Diet: eats good - grilled, avoids fried foods, carbs; does not eat very frequent   Exercise: does cardio every week   Social Determinants of Radio broadcast assistant Strain:   . Difficulty of Paying Living Expenses: Not on file  Food Insecurity:   . Worried About Charity fundraiser in the Last Year: Not on file  . Ran Out of Food in the Last Year: Not on file  Transportation Needs:   . Lack of Transportation (Medical): Not on file  . Lack of Transportation (Non-Medical): Not on file  Physical Activity:   . Days of Exercise per Week: Not on file  . Minutes of Exercise per Session:  Not on file  Stress:   . Feeling of Stress : Not on file  Social Connections:   . Frequency of Communication with Friends and Family: Not on file  . Frequency of Social Gatherings with Friends and Family: Not on file  . Attends Religious Services: Not on file  . Active Member of Clubs or Organizations: Not on file  . Attends Banker Meetings: Not on file  . Marital Status: Not on file  Intimate Partner Violence:   . Fear of Current or Ex-Partner: Not on file  . Emotionally Abused: Not on file  . Physically Abused: Not on file  . Sexually Abused: Not on file    Outpatient Medications Prior to Visit  Medication Sig Dispense Refill  . albuterol (VENTOLIN HFA) 108 (90 Base) MCG/ACT inhaler Inhale 1-2 puffs into the lungs every 6 (six)  hours as needed. 8 g 2  . atorvastatin (LIPITOR) 40 MG tablet Take 1 tablet (40 mg total) by mouth daily. 90 tablet 1  . levothyroxine (SYNTHROID) 50 MCG tablet Take 1 tablet (50 mcg total) by mouth daily. 90 tablet 1  . sertraline (ZOLOFT) 50 MG tablet TAKE 1 TABLET BY MOUTH EVERY DAY 90 tablet 1  . Vitamin D, Ergocalciferol, (DRISDOL) 1.25 MG (50000 UT) CAPS capsule Take 1 capsule (50,000 Units total) by mouth every 7 (seven) days. 4 capsule 1  . LORazepam (ATIVAN) 1 MG tablet Take 1 tablet (1 mg total) by mouth at bedtime. 10 tablet 0   No facility-administered medications prior to visit.    Allergies  Allergen Reactions  . Amoxicillin Nausea And Vomiting  . Montelukast Other (See Comments)    Causes forgetfulness     ROS Review of Systems  Constitutional: Positive for fatigue.       Improved  HENT: Negative.   Eyes: Negative.   Respiratory: Negative.   Cardiovascular: Negative.   Gastrointestinal: Negative.   Endocrine: Negative.  Negative for cold intolerance and heat intolerance.  Genitourinary: Negative.   Musculoskeletal: Negative.   Allergic/Immunologic: Negative.   Neurological: Negative.   Hematological: Negative.   Psychiatric/Behavioral: Negative.       Objective:    Physical Exam  Constitutional: He is oriented to person, place, and time. He appears well-developed and well-nourished.  HENT:  Right Ear: External ear normal.  Left Ear: External ear normal.  Nose: Nose normal.  Mouth/Throat: Oropharynx is clear and moist.  Neck: No thyromegaly present.  Cardiovascular: Normal rate.  Pulmonary/Chest: Effort normal and breath sounds normal.  Abdominal: Soft. Bowel sounds are normal.  Musculoskeletal:        General: Normal range of motion.     Cervical back: Normal range of motion and neck supple.  Neurological: He is alert and oriented to person, place, and time.  Skin: Skin is warm and dry. No rash noted.  Psychiatric: He has a normal mood and affect.     BP 120/78 (BP Location: Left Arm, Patient Position: Sitting, Cuff Size: Large)   Pulse 68   Temp (!) 97.3 F (36.3 C) (Temporal)   Wt 272 lb 12.8 oz (123.7 kg)   SpO2 98%   BMI 35.50 kg/m  Wt Readings from Last 3 Encounters:  10/13/19 272 lb 12.8 oz (123.7 kg)  08/20/19 217 lb 6.4 oz (98.6 kg)  08/19/19 275 lb (124.7 kg)     Health Maintenance Due  Topic Date Due  . HIV Screening  02/05/1998    There are no preventive care reminders to  display for this patient.  Lab Results  Component Value Date   TSH 66.66 (H) 08/21/2019   Lab Results  Component Value Date   WBC 6.2 08/21/2019   HGB 12.6 (L) 08/21/2019   HCT 38.2 (L) 08/21/2019   MCV 94.7 08/21/2019   PLT 189.0 08/21/2019   Lab Results  Component Value Date   NA 137 08/21/2019   K 4.1 08/21/2019   CO2 33 (H) 08/21/2019   GLUCOSE 106 (H) 08/21/2019   BUN 27 (H) 08/21/2019   CREATININE 1.67 (H) 08/21/2019   BILITOT 0.8 08/21/2019   ALKPHOS 51 08/21/2019   AST 68 (H) 08/21/2019   ALT 67 (H) 08/21/2019   PROT 7.8 08/21/2019   ALBUMIN 5.4 (H) 08/21/2019   CALCIUM 10.4 08/21/2019   ANIONGAP 6 06/14/2019   GFR 56.44 (L) 08/21/2019   Lab Results  Component Value Date   CHOL 357 (H) 08/21/2019   Lab Results  Component Value Date   HDL 114.70 08/21/2019   Lab Results  Component Value Date   LDLCALC 222 (H) 08/21/2019   Lab Results  Component Value Date   TRIG 103.0 08/21/2019   Lab Results  Component Value Date   CHOLHDL 3 08/21/2019   Lab Results  Component Value Date   HGBA1C 6.0 08/21/2019      Assessment & Plan:   Problem List Items Addressed This Visit    Hypothyroidism - Primary   Relevant Orders   TSH   Hyperlipidemia   Relevant Orders   Lipid Panel   Anxiety   Relevant Medications   LORazepam (ATIVAN) 1 MG tablet    Other Visit Diagnoses    Other fatigue       Relevant Orders   SAR CoV2 Serology (COVID 19)AB(IGG)IA   Hair loss          Meds ordered this encounter    Medications  . LORazepam (ATIVAN) 1 MG tablet    Sig: Take 1 tablet (1 mg total) by mouth at bedtime.    Dispense:  10 tablet    Refill:  0    Follow-up: Recheck in 6 weeks, pending labs, and sooner as needed.   Eulis Foster, FNP

## 2019-10-13 NOTE — Discharge Instructions (Signed)

## 2019-10-13 NOTE — Patient Instructions (Signed)
Hypothyroidism  Hypothyroidism is when the thyroid gland does not make enough of certain hormones (it is underactive). The thyroid gland is a small gland located in the lower front part of the neck, just in front of the windpipe (trachea). This gland makes hormones that help control how the body uses food for energy (metabolism) as well as how the heart and brain function. These hormones also play a role in keeping your bones strong. When the thyroid is underactive, it produces too little of the hormones thyroxine (T4) and triiodothyronine (T3). What are the causes? This condition may be caused by:  Hashimoto's disease. This is a disease in which the body's disease-fighting system (immune system) attacks the thyroid gland. This is the most common cause.  Viral infections.  Pregnancy.  Certain medicines.  Birth defects.  Past radiation treatments to the head or neck for cancer.  Past treatment with radioactive iodine.  Past exposure to radiation in the environment.  Past surgical removal of part or all of the thyroid.  Problems with a gland in the center of the brain (pituitary gland).  Lack of enough iodine in the diet. What increases the risk? You are more likely to develop this condition if:  You are male.  You have a family history of thyroid conditions.  You use a medicine called lithium.  You take medicines that affect the immune system (immunosuppressants). What are the signs or symptoms? Symptoms of this condition include:  Feeling as though you have no energy (lethargy).  Not being able to tolerate cold.  Weight gain that is not explained by a change in diet or exercise habits.  Lack of appetite.  Dry skin.  Coarse hair.  Menstrual irregularity.  Slowing of thought processes.  Constipation.  Sadness or depression. How is this diagnosed? This condition may be diagnosed based on:  Your symptoms, your medical history, and a physical exam.  Blood  tests. You may also have imaging tests, such as an ultrasound or MRI. How is this treated? This condition is treated with medicine that replaces the thyroid hormones that your body does not make. After you begin treatment, it may take several weeks for symptoms to go away. Follow these instructions at home:  Take over-the-counter and prescription medicines only as told by your health care provider.  If you start taking any new medicines, tell your health care provider.  Keep all follow-up visits as told by your health care provider. This is important. ? As your condition improves, your dosage of thyroid hormone medicine may change. ? You will need to have blood tests regularly so that your health care provider can monitor your condition. Contact a health care provider if:  Your symptoms do not get better with treatment.  You are taking thyroid replacement medicine and you: ? Sweat a lot. ? Have tremors. ? Feel anxious. ? Lose weight rapidly. ? Cannot tolerate heat. ? Have emotional swings. ? Have diarrhea. ? Feel weak. Get help right away if you have:  Chest pain.  An irregular heartbeat.  A rapid heartbeat.  Difficulty breathing. Summary  Hypothyroidism is when the thyroid gland does not make enough of certain hormones (it is underactive).  When the thyroid is underactive, it produces too little of the hormones thyroxine (T4) and triiodothyronine (T3).  The most common cause is Hashimoto's disease, a disease in which the body's disease-fighting system (immune system) attacks the thyroid gland. The condition can also be caused by viral infections, medicine, pregnancy, or past   radiation treatment to the head or neck.  Symptoms may include weight gain, dry skin, constipation, feeling as though you do not have energy, and not being able to tolerate cold.  This condition is treated with medicine to replace the thyroid hormones that your body does not make. This information  is not intended to replace advice given to you by your health care provider. Make sure you discuss any questions you have with your health care provider. Document Revised: 07/12/2017 Document Reviewed: 07/10/2017 Elsevier Patient Education  2020 Elsevier Inc.  

## 2019-10-14 ENCOUNTER — Other Ambulatory Visit: Payer: Self-pay | Admitting: Family

## 2019-10-14 LAB — SAR COV2 SEROLOGY (COVID19)AB(IGG),IA: DiaSorin SARS-CoV-2 Ab, IgG: NEGATIVE

## 2019-10-14 MED ORDER — LEVOTHYROXINE SODIUM 75 MCG PO TABS: 75.0000 ug | ORAL_TABLET | Freq: Every day | ORAL | 2 refills | Status: DC

## 2019-10-14 NOTE — Progress Notes (Signed)
Patient is aware and a follow up appointment scheduled.

## 2019-10-27 ENCOUNTER — Encounter: Payer: Self-pay | Admitting: Internal Medicine

## 2019-11-02 ENCOUNTER — Other Ambulatory Visit: Payer: Self-pay

## 2019-11-27 ENCOUNTER — Other Ambulatory Visit: Payer: Self-pay

## 2019-11-27 ENCOUNTER — Ambulatory Visit (INDEPENDENT_AMBULATORY_CARE_PROVIDER_SITE_OTHER): Payer: Self-pay | Admitting: Internal Medicine

## 2019-11-27 ENCOUNTER — Encounter: Payer: Self-pay | Admitting: Internal Medicine

## 2019-11-27 ENCOUNTER — Other Ambulatory Visit: Payer: Self-pay | Admitting: Internal Medicine

## 2019-11-27 VITALS — BP 110/70 | HR 81 | Temp 97.3°F | Wt 273.5 lb

## 2019-11-27 DIAGNOSIS — E038 Other specified hypothyroidism: Secondary | ICD-10-CM

## 2019-11-27 DIAGNOSIS — R7989 Other specified abnormal findings of blood chemistry: Secondary | ICD-10-CM

## 2019-11-27 DIAGNOSIS — E039 Hypothyroidism, unspecified: Secondary | ICD-10-CM

## 2019-11-27 LAB — COMPREHENSIVE METABOLIC PANEL
ALT: 15 U/L (ref 0–53)
AST: 17 U/L (ref 0–37)
Albumin: 4.9 g/dL (ref 3.5–5.2)
Alkaline Phosphatase: 55 U/L (ref 39–117)
BUN: 25 mg/dL — ABNORMAL HIGH (ref 6–23)
CO2: 31 mEq/L (ref 19–32)
Calcium: 9.9 mg/dL (ref 8.4–10.5)
Chloride: 100 mEq/L (ref 96–112)
Creatinine, Ser: 1.2 mg/dL (ref 0.40–1.50)
GFR: 82.52 mL/min (ref >60.00)
Glucose, Bld: 105 mg/dL — ABNORMAL HIGH (ref 70–99)
Potassium: 4 mEq/L (ref 3.5–5.1)
Sodium: 140 mEq/L (ref 135–145)
Total Bilirubin: 0.4 mg/dL (ref 0.2–1.2)
Total Protein: 7.1 g/dL (ref 6.0–8.3)

## 2019-11-27 LAB — TSH: TSH: 33.72 u[IU]/mL — ABNORMAL HIGH (ref 0.35–4.50)

## 2019-11-27 MED ORDER — LEVOTHYROXINE SODIUM 100 MCG PO TABS: 100.0000 ug | ORAL_TABLET | Freq: Every day | ORAL | 1 refills | Status: DC

## 2019-11-27 NOTE — Patient Instructions (Signed)
-  Nice seeing you today!!  -Lab work today; will notify you once results are available.  -Go back to taking the 75 mcg of synthroid until I review your labs results.

## 2019-11-27 NOTE — Progress Notes (Signed)
Established Patient Office Visit     This visit occurred during the SARS-CoV-2 public health emergency.  Safety protocols were in place, including screening questions prior to the visit, additional usage of staff PPE, and extensive cleaning of exam room while observing appropriate contact time as indicated for disinfecting solutions.    CC/Reason for Visit: Follow-up hypothyroidism  HPI: Carlos Wheeler is a 39 y.o. male who is coming in today for the above mentioned reasons.  He was diagnosed with hypothyroidism back in January.  Was initially started on 50 mcg of Synthroid.  He felt significant improvement, mood was better, he stopped having joint issues, he started growing back here in his extremities.  When he came back for his TSH recheck it was still high, although improved, so we decided to increase the dose to 75.  He says he took this for about 4 weeks but then he started having "weird dreams" which she attributed to the Synthroid so about 5 days ago he stopped taking Synthroid altogether.   Past Medical/Surgical History: Past Medical History:  Diagnosis Date  . Anxiety   . Asthma   . GERD (gastroesophageal reflux disease)   . Sleep apnea     Past Surgical History:  Procedure Laterality Date  . NO PAST SURGERIES      Social History:  reports that he has never smoked. He has never used smokeless tobacco. He reports previous alcohol use of about 2.0 standard drinks of alcohol per week. He reports that he does not use drugs.  Allergies: Allergies  Allergen Reactions  . Amoxicillin Nausea And Vomiting  . Montelukast Other (See Comments)    Causes forgetfulness     Family History:  Family History  Problem Relation Age of Onset  . Hyperlipidemia Mother   . Hypertension Mother   . Heart attack Father 32  . Heart disease Father   . Hypertension Father   . Diabetes Sister   . Hyperlipidemia Sister   . Hypertension Sister   . Diabetes Maternal Grandmother    . Hypertension Maternal Grandmother   . Diabetes Maternal Grandfather   . Hypertension Maternal Grandfather   . Heart attack Maternal Grandfather 70  . Diabetes Paternal Grandmother   . Stroke Paternal Grandmother 58  . Diabetes Paternal Grandfather   . Cancer Neg Hx      Current Outpatient Medications:  .  albuterol (VENTOLIN HFA) 108 (90 Base) MCG/ACT inhaler, Inhale 1-2 puffs into the lungs every 6 (six) hours as needed., Disp: 8 g, Rfl: 2 .  atorvastatin (LIPITOR) 40 MG tablet, Take 1 tablet (40 mg total) by mouth daily., Disp: 90 tablet, Rfl: 1 .  LORazepam (ATIVAN) 1 MG tablet, Take 1 tablet (1 mg total) by mouth at bedtime., Disp: 10 tablet, Rfl: 0 .  sertraline (ZOLOFT) 50 MG tablet, TAKE 1 TABLET BY MOUTH EVERY DAY, Disp: 90 tablet, Rfl: 1 .  Vitamin D, Ergocalciferol, (DRISDOL) 1.25 MG (50000 UT) CAPS capsule, Take 1 capsule (50,000 Units total) by mouth every 7 (seven) days., Disp: 4 capsule, Rfl: 1 .  levothyroxine (SYNTHROID) 75 MCG tablet, Take 1 tablet (75 mcg total) by mouth daily. (Patient not taking: Reported on 11/27/2019), Disp: 30 tablet, Rfl: 2  Review of Systems:  Constitutional: Denies fever, chills, diaphoresis, appetite change and fatigue.  HEENT: Denies photophobia, eye pain, redness, hearing loss, ear pain, congestion, sore throat, rhinorrhea, sneezing, mouth sores, trouble swallowing, neck pain, neck stiffness and tinnitus.   Respiratory: Denies SOB,  DOE, cough, chest tightness,  and wheezing.   Cardiovascular: Denies chest pain, palpitations and leg swelling.  Gastrointestinal: Denies nausea, vomiting, abdominal pain, diarrhea, constipation, blood in stool and abdominal distention.  Genitourinary: Denies dysuria, urgency, frequency, hematuria, flank pain and difficulty urinating.  Endocrine: Denies: hot or cold intolerance, sweats, changes in hair or nails, polyuria, polydipsia. Musculoskeletal: Denies myalgias, back pain, joint swelling, arthralgias and  gait problem.  Skin: Denies pallor, rash and wound.  Neurological: Denies dizziness, seizures, syncope, weakness, light-headedness, numbness and headaches.  Hematological: Denies adenopathy. Easy bruising, personal or family bleeding history  Psychiatric/Behavioral: Denies suicidal ideation, mood changes, confusion, nervousness, sleep disturbance and agitation    Physical Exam: Vitals:   11/27/19 1344  BP: 110/70  Pulse: 81  Temp: (!) 97.3 F (36.3 C)  TempSrc: Temporal  SpO2: 96%  Weight: 273 lb 8 oz (124.1 kg)    Body mass index is 35.59 kg/m.   Constitutional: NAD, calm, comfortable Eyes: PERRL, lids and conjunctivae normal ENMT: Mucous membranes are moist.  Respiratory: clear to auscultation bilaterally, no wheezing, no crackles. Normal respiratory effort. No accessory muscle use.  Cardiovascular: Regular rate and rhythm, no murmurs / rubs / gallops. No extremity edema.  Neurologic: Grossly intact and nonfocal Psychiatric: Normal judgment and insight. Alert and oriented x 3. Normal mood.    Impression and Plan:  Other specified hypothyroidism  - Plan: TSH -Have counseled him on importance of remaining on Synthroid medication, have explained negative feedback loop and how an elevated TSH would prompt [place] to increase his Synthroid dose.  He thought that because his TSH was high we were giving him too much Synthroid.  I have counseled him to stay on 75 mcg for now pending lab results from today.    Patient Instructions  -Nice seeing you today!!  -Lab work today; will notify you once results are available.  -Go back to taking the 75 mcg of synthroid until I review your labs results.     Chaya Jan, MD [facility] at [facility]

## 2019-11-30 ENCOUNTER — Ambulatory Visit: Payer: Self-pay | Attending: Internal Medicine

## 2019-11-30 DIAGNOSIS — Z20822 Contact with and (suspected) exposure to covid-19: Secondary | ICD-10-CM | POA: Insufficient documentation

## 2019-12-01 LAB — NOVEL CORONAVIRUS, NAA: SARS-CoV-2, NAA: NOT DETECTED

## 2019-12-01 LAB — SARS-COV-2, NAA 2 DAY TAT

## 2020-01-02 ENCOUNTER — Other Ambulatory Visit: Payer: Self-pay | Admitting: Family

## 2020-01-14 ENCOUNTER — Encounter: Payer: Self-pay | Admitting: Internal Medicine

## 2020-01-18 ENCOUNTER — Encounter: Payer: Self-pay | Admitting: Internal Medicine

## 2020-01-19 ENCOUNTER — Other Ambulatory Visit: Payer: Self-pay | Admitting: Internal Medicine

## 2020-01-19 DIAGNOSIS — F419 Anxiety disorder, unspecified: Secondary | ICD-10-CM

## 2020-01-19 MED ORDER — LORAZEPAM 1 MG PO TABS: 1.0000 mg | ORAL_TABLET | Freq: Every day | ORAL | 0 refills | Status: DC

## 2020-01-25 ENCOUNTER — Telehealth: Payer: Self-pay

## 2020-01-25 DIAGNOSIS — E039 Hypothyroidism, unspecified: Secondary | ICD-10-CM

## 2020-01-26 NOTE — Telephone Encounter (Signed)
Left detailed message on machine for patient to schedule a lab appointment.  Order placed. 

## 2020-01-29 ENCOUNTER — Other Ambulatory Visit (INDEPENDENT_AMBULATORY_CARE_PROVIDER_SITE_OTHER): Payer: 59

## 2020-01-29 ENCOUNTER — Other Ambulatory Visit: Payer: Self-pay

## 2020-01-29 DIAGNOSIS — E039 Hypothyroidism, unspecified: Secondary | ICD-10-CM

## 2020-01-30 LAB — TSH: TSH: 15.77 mIU/L — ABNORMAL HIGH (ref 0.40–4.50)

## 2020-02-09 ENCOUNTER — Encounter: Payer: BC Managed Care – PPO | Admitting: Family Medicine

## 2020-02-26 ENCOUNTER — Encounter: Payer: Self-pay | Admitting: Internal Medicine

## 2020-03-11 ENCOUNTER — Ambulatory Visit: Payer: 59 | Admitting: Internal Medicine

## 2020-03-16 ENCOUNTER — Ambulatory Visit: Payer: 59 | Admitting: Internal Medicine

## 2020-03-18 ENCOUNTER — Encounter: Payer: Self-pay | Admitting: Internal Medicine

## 2020-03-18 ENCOUNTER — Other Ambulatory Visit: Payer: Self-pay

## 2020-03-18 MED ORDER — LEVOTHYROXINE SODIUM 75 MCG PO TABS: 75.0000 ug | ORAL_TABLET | Freq: Every day | ORAL | 0 refills | Status: DC

## 2020-03-24 ENCOUNTER — Ambulatory Visit (INDEPENDENT_AMBULATORY_CARE_PROVIDER_SITE_OTHER): Payer: 59 | Admitting: Internal Medicine

## 2020-03-24 ENCOUNTER — Other Ambulatory Visit: Payer: Self-pay

## 2020-03-24 ENCOUNTER — Encounter: Payer: Self-pay | Admitting: Internal Medicine

## 2020-03-24 VITALS — BP 120/70 | HR 75 | Temp 98.0°F | Wt 287.5 lb

## 2020-03-24 DIAGNOSIS — E038 Other specified hypothyroidism: Secondary | ICD-10-CM

## 2020-03-24 DIAGNOSIS — K219 Gastro-esophageal reflux disease without esophagitis: Secondary | ICD-10-CM | POA: Diagnosis not present

## 2020-03-24 MED ORDER — LEVOTHYROXINE SODIUM 75 MCG PO TABS: 37.5000 ug | ORAL_TABLET | Freq: Every day | ORAL | 0 refills | Status: DC

## 2020-03-24 MED ORDER — PANTOPRAZOLE SODIUM 40 MG PO TBEC: 40.0000 mg | DELAYED_RELEASE_TABLET | Freq: Every day | ORAL | 1 refills | Status: DC

## 2020-03-24 NOTE — Progress Notes (Signed)
Established Patient Office Visit     This visit occurred during the SARS-CoV-2 public health emergency.  Safety protocols were in place, including screening questions prior to the visit, additional usage of staff PPE, and extensive cleaning of exam room while observing appropriate contact time as indicated for disinfecting solutions.    CC/Reason for Visit: Follow-up chronic medical conditions  HPI: Carlos Wheeler is a 39 y.o. male who is coming in today for the above mentioned reasons. Past Medical History is significant for: Hypothyroidism, he has decided to take half of a 75 mcg of Synthroid despite my advice, generalized anxiety disorder on Zoloft and Ativan, history of obstructive sleep apnea and vitamin D deficiency.  He is complaining today of feeling gassy and bloated.  He has a burning sensation in his chest and he has been burping a lot.  He has noticed that red wine, spicy foods and coffee are the worst.  He has not soft bowel movement every day, no blood.   Past Medical/Surgical History: Past Medical History:  Diagnosis Date  . Anxiety   . Asthma   . GERD (gastroesophageal reflux disease)   . Sleep apnea     Past Surgical History:  Procedure Laterality Date  . NO PAST SURGERIES      Social History:  reports that he has never smoked. He has never used smokeless tobacco. He reports previous alcohol use of about 2.0 standard drinks of alcohol per week. He reports that he does not use drugs.  Allergies: Allergies  Allergen Reactions  . Amoxicillin Nausea And Vomiting  . Montelukast Other (See Comments)    Causes forgetfulness     Family History:  Family History  Problem Relation Age of Onset  . Hyperlipidemia Mother   . Hypertension Mother   . Heart attack Father 57  . Heart disease Father   . Hypertension Father   . Diabetes Sister   . Hyperlipidemia Sister   . Hypertension Sister   . Diabetes Maternal Grandmother   . Hypertension Maternal  Grandmother   . Diabetes Maternal Grandfather   . Hypertension Maternal Grandfather   . Heart attack Maternal Grandfather 70  . Diabetes Paternal Grandmother   . Stroke Paternal Grandmother 79  . Diabetes Paternal Grandfather   . Cancer Neg Hx      Current Outpatient Medications:  .  albuterol (VENTOLIN HFA) 108 (90 Base) MCG/ACT inhaler, Inhale 1-2 puffs into the lungs every 6 (six) hours as needed., Disp: 8 g, Rfl: 2 .  atorvastatin (LIPITOR) 40 MG tablet, Take 1 tablet (40 mg total) by mouth daily., Disp: 90 tablet, Rfl: 1 .  levothyroxine (SYNTHROID) 75 MCG tablet, Take 0.5 tablets (37.5 mcg total) by mouth daily., Disp: 30 tablet, Rfl: 0 .  LORazepam (ATIVAN) 1 MG tablet, Take 1 tablet (1 mg total) by mouth at bedtime., Disp: 10 tablet, Rfl: 0 .  pantoprazole (PROTONIX) 40 MG tablet, Take 1 tablet (40 mg total) by mouth daily., Disp: 90 tablet, Rfl: 1 .  sertraline (ZOLOFT) 50 MG tablet, TAKE 1 TABLET BY MOUTH EVERY DAY, Disp: 90 tablet, Rfl: 1 .  Vitamin D, Ergocalciferol, (DRISDOL) 1.25 MG (50000 UT) CAPS capsule, Take 1 capsule (50,000 Units total) by mouth every 7 (seven) days., Disp: 4 capsule, Rfl: 1  Review of Systems:  Constitutional: Denies fever, chills, diaphoresis, appetite change and fatigue.  HEENT: Denies photophobia, eye pain, redness, hearing loss, ear pain, congestion, sore throat, rhinorrhea, sneezing, mouth sores, trouble swallowing, neck  pain, neck stiffness and tinnitus.   Respiratory: Denies SOB, DOE, cough, chest tightness,  and wheezing.   Cardiovascular: Denies chest pain, palpitations and leg swelling.  Gastrointestinal: Deniesabdominal pain, diarrhea, constipation, blood in stool. Genitourinary: Denies dysuria, urgency, frequency, hematuria, flank pain and difficulty urinating.  Endocrine: Denies: hot or cold intolerance, sweats, changes in hair or nails, polyuria, polydipsia. Musculoskeletal: Denies myalgias, back pain, joint swelling, arthralgias and  gait problem.  Skin: Denies pallor, rash and wound.  Neurological: Denies dizziness, seizures, syncope, weakness, light-headedness, numbness and headaches.  Hematological: Denies adenopathy. Easy bruising, personal or family bleeding history  Psychiatric/Behavioral: Denies suicidal ideation, mood changes, confusion, nervousness, sleep disturbance and agitation    Physical Exam: Vitals:   03/24/20 1417  BP: 120/70  Pulse: 75  Temp: 98 F (36.7 C)  TempSrc: Oral  SpO2: 97%  Weight: 287 lb 8 oz (130.4 kg)    Body mass index is 37.42 kg/m.   Constitutional: NAD, calm, comfortable Eyes: PERRL, lids and conjunctivae normal ENMT: Mucous membranes are moist.  Respiratory: clear to auscultation bilaterally, no wheezing, no crackles. Normal respiratory effort. No accessory muscle use.  Cardiovascular: Regular rate and rhythm, no murmurs / rubs / gallops. No extremity edema.  Abdomen: no tenderness, no masses palpated. No hepatosplenomegaly. Bowel sounds positive.  Neurologic: grossly intact and non-focal  Psychiatric: Normal judgment and insight. Alert and oriented x 3. Normal mood.    Impression and Plan:  Other specified hypothyroidism -Check TSH today. -Recs regarding dosage pending these results, but suspect he will ned a higher dose.  Gastroesophageal reflux disease without esophagitis  -Symptoms sound suspicious for GERD. -Have advised to stay away from ETOH, NSAIDS, spicy foods, tomato and caffeine. -Start protonix 40 mg daily for 12 weeks. -If no better in 12 weeks, consider referral to GI for consideration of EGD.   Patient Instructions  -Nice seeing you today!!  -Lab work today; will notify you once results are available.  -Start Protonix 40 mg daily.  -See you back in 3 months for your physical. Please come in fasting that day.     Chaya Jan, MD [facility] at [facility]

## 2020-03-24 NOTE — Patient Instructions (Signed)
-  Nice seeing you today!!  -Lab work today; will notify you once results are available.  -Start Protonix 40 mg daily.  -See you back in 3 months for your physical. Please come in fasting that day.

## 2020-03-24 NOTE — Addendum Note (Signed)
Addended by: Lerry Liner on: 03/24/2020 03:05 PM   Modules accepted: Orders

## 2020-03-25 ENCOUNTER — Telehealth: Payer: Self-pay | Admitting: Internal Medicine

## 2020-03-25 ENCOUNTER — Encounter: Payer: Self-pay | Admitting: *Deleted

## 2020-03-25 ENCOUNTER — Other Ambulatory Visit: Payer: Self-pay | Admitting: Internal Medicine

## 2020-03-25 DIAGNOSIS — E038 Other specified hypothyroidism: Secondary | ICD-10-CM

## 2020-03-25 LAB — TSH: TSH: 13.45 mIU/L — ABNORMAL HIGH (ref 0.40–4.50)

## 2020-03-25 MED ORDER — LEVOTHYROXINE SODIUM 50 MCG PO TABS: 50.0000 ug | ORAL_TABLET | Freq: Every day | ORAL | 0 refills | Status: DC

## 2020-03-25 NOTE — Telephone Encounter (Signed)
Noted. Released to mychart with a result note.

## 2020-03-25 NOTE — Telephone Encounter (Signed)
Pt is returning your call and stated that whatever you need you can just send it in his mychart.  If it is about the medication he already knows what the doctor told him but if you really need to give him a msg just send it to the mychart b/c he really don't need to talk with you.

## 2020-04-17 ENCOUNTER — Other Ambulatory Visit: Payer: Self-pay | Admitting: Internal Medicine

## 2020-04-17 DIAGNOSIS — E038 Other specified hypothyroidism: Secondary | ICD-10-CM

## 2020-04-20 NOTE — Telephone Encounter (Signed)
Please advise. Per the patients results he should be on .

## 2020-05-02 ENCOUNTER — Encounter: Payer: Self-pay | Admitting: Internal Medicine

## 2020-05-04 ENCOUNTER — Encounter: Payer: Self-pay | Admitting: Internal Medicine

## 2020-05-05 ENCOUNTER — Encounter: Payer: Self-pay | Admitting: Internal Medicine

## 2020-05-06 MED ORDER — HYDROXYZINE HCL 50 MG PO TABS: 50.0000 mg | ORAL_TABLET | Freq: Three times a day (TID) | ORAL | 1 refills | Status: DC | PRN

## 2020-05-07 ENCOUNTER — Other Ambulatory Visit: Payer: Self-pay | Admitting: Internal Medicine

## 2020-05-07 DIAGNOSIS — E038 Other specified hypothyroidism: Secondary | ICD-10-CM

## 2020-05-13 ENCOUNTER — Ambulatory Visit (INDEPENDENT_AMBULATORY_CARE_PROVIDER_SITE_OTHER): Payer: 59 | Admitting: Adult Health

## 2020-05-13 ENCOUNTER — Other Ambulatory Visit: Payer: Self-pay

## 2020-05-13 ENCOUNTER — Encounter: Payer: Self-pay | Admitting: Adult Health

## 2020-05-13 VITALS — BP 138/64 | HR 88 | Temp 98.1°F | Ht 73.5 in | Wt 292.0 lb

## 2020-05-13 DIAGNOSIS — L0291 Cutaneous abscess, unspecified: Secondary | ICD-10-CM

## 2020-05-13 NOTE — Progress Notes (Signed)
Subjective:    Patient ID: Carlos Wheeler, male    DOB: June 16, 1981, 39 y.o.   MRN: [id number]  HPI 39 year old male who  has a past medical history of Anxiety, Asthma, GERD (gastroesophageal reflux disease), and Sleep apnea.  He presents to the office today for an acute issue of abscess on his left medial wrist.  He believes he was bit by an insect.  Was seen at urgent care 2 days ago and was started on clindamycin 300 mg 3 times daily.  He does report that he has started this antibiotic but continues to have a red, raised, itchy, and painful area on his left wrist.  He has not noticed any drainage from the abscess  States "they wanted to drain it there but I want to let them".   Review of Systems See HPI   Past Medical History:  Diagnosis Date  . Anxiety   . Asthma   . GERD (gastroesophageal reflux disease)   . Sleep apnea     Social History   Socioeconomic History  . Marital status: Married    Spouse name: [place]  . Number of children: 2  . Years of education: MBA  . Highest education level: Not on file  Occupational History  . Not on file  Tobacco Use  . Smoking status: Never Smoker  . Smokeless tobacco: Never Used  Vaping Use  . Vaping Use: Never used  Substance and Sexual Activity  . Alcohol use: Not Currently    Alcohol/week: 2.0 standard drinks    Types: 2 Glasses of wine per week    Comment: quit drinking heavily 2 months ago  . Drug use: No  . Sexual activity: Yes    Birth control/protection: Surgical  Other Topics Concern  . Not on file  Social History Narrative   09/30/18   Lives with wife and 2 children - twin boy (Tavonte) and girl ([name]) - born 2017   Enjoys: drag race, working on cars - trying to start a business   Work: Acupuncturist   Diet: eats good - grilled, avoids fried foods, carbs; does not eat very frequent   Exercise: does cardio every week   Social Determinants of Corporate investment banker Strain:   .  Difficulty of Paying Living Expenses: Not on file  Food Insecurity:   . Worried About Programme researcher, broadcasting/film/video in the Last Year: Not on file  . Ran Out of Food in the Last Year: Not on file  Transportation Needs:   . Lack of Transportation (Medical): Not on file  . Lack of Transportation (Non-Medical): Not on file  Physical Activity:   . Days of Exercise per Week: Not on file  . Minutes of Exercise per Session: Not on file  Stress:   . Feeling of Stress : Not on file  Social Connections:   . Frequency of Communication with Friends and Family: Not on file  . Frequency of Social Gatherings with Friends and Family: Not on file  . Attends Religious Services: Not on file  . Active Member of Clubs or Organizations: Not on file  . Attends Banker Meetings: Not on file  . Marital Status: Not on file  Intimate Partner Violence:   . Fear of Current or Ex-Partner: Not on file  . Emotionally Abused: Not on file  . Physically Abused: Not on file  . Sexually Abused: Not on file    Past Surgical History:  Procedure Laterality  Date  . NO PAST SURGERIES      Family History  Problem Relation Age of Onset  . Hyperlipidemia Mother   . Hypertension Mother   . Heart attack Father 40  . Heart disease Father   . Hypertension Father   . Diabetes Sister   . Hyperlipidemia Sister   . Hypertension Sister   . Diabetes Maternal Grandmother   . Hypertension Maternal Grandmother   . Diabetes Maternal Grandfather   . Hypertension Maternal Grandfather   . Heart attack Maternal Grandfather 70  . Diabetes Paternal Grandmother   . Stroke Paternal Grandmother 44  . Diabetes Paternal Grandfather   . Cancer Neg Hx     Allergies  Allergen Reactions  . Amoxicillin Nausea And Vomiting  . Montelukast Other (See Comments)    Causes forgetfulness     Current Outpatient Medications on File Prior to Visit  Medication Sig Dispense Refill  . albuterol (VENTOLIN HFA) 108 (90 Base) MCG/ACT inhaler  Inhale 1-2 puffs into the lungs every 6 (six) hours as needed. 8 g 2  . clindamycin (CLEOCIN) 300 MG capsule Take 300 mg by mouth 3 (three) times daily.    . hydrOXYzine (ATARAX/VISTARIL) 50 MG tablet Take 1 tablet (50 mg total) by mouth 3 (three) times daily as needed. 90 tablet 1  . levothyroxine (SYNTHROID) 50 MCG tablet Take 1 tablet (50 mcg total) by mouth daily. 90 tablet 0  . LORazepam (ATIVAN) 1 MG tablet Take 1 tablet (1 mg total) by mouth at bedtime. 10 tablet 0  . pantoprazole (PROTONIX) 40 MG tablet Take 1 tablet (40 mg total) by mouth daily. 90 tablet 1  . atorvastatin (LIPITOR) 40 MG tablet Take 1 tablet (40 mg total) by mouth daily. (Patient not taking: Reported on 05/13/2020) 90 tablet 1  . levothyroxine (SYNTHROID) 75 MCG tablet TAKE 1 TABLET BY MOUTH EVERY DAY (Patient not taking: Reported on 05/13/2020) 30 tablet 0  . sertraline (ZOLOFT) 50 MG tablet TAKE 1 TABLET BY MOUTH EVERY DAY (Patient not taking: Reported on 05/13/2020) 90 tablet 1  . Vitamin D, Ergocalciferol, (DRISDOL) 1.25 MG (50000 UT) CAPS capsule Take 1 capsule (50,000 Units total) by mouth every 7 (seven) days. (Patient not taking: Reported on 05/13/2020) 4 capsule 1   No current facility-administered medications on file prior to visit.    BP 138/64 (BP Location: Left Arm, Patient Position: Sitting, Cuff Size: Large)   Pulse 88   Temp 98.1 F (36.7 C) (Oral)   Ht 6' 1.5" (1.867 m)   Wt 292 lb (132.5 kg)   SpO2 96%   BMI 38.00 kg/m       Objective:   Physical Exam Vitals and nursing note reviewed.  Constitutional:      Appearance: Normal appearance.  Skin:    Findings: Erythema present.     Comments: Quarter sized nonfluctuant abscess noted on left medial wrist.  No active drainage noted.  Redness, warmth, and edema present.  He does have pain with palpation.  Neurological:     General: No focal deficit present.     Mental Status: He is alert and oriented to person, place, and time.  Psychiatric:         Mood and Affect: Mood normal.        Behavior: Behavior normal.        Thought Content: Thought content normal.        Judgment: Judgment normal.       Assessment & Plan:  1. Abscess -  Since the abscess is nonfluctuant, he was advised that incision and drainage would not be advised at this time.  He can apply a warm compress and continue to take antibiotics.  If the abscess comes to ahead and starts leaking then follow-up for I&D, more than likely antibiotics will resolve this abscess  Shirline Frees, NP

## 2020-05-13 NOTE — Progress Notes (Signed)
   Subjective:    Patient ID: Carlos Wheeler, male    DOB: 1980-11-05, 39 y.o.   MRN: [id number]  HPI    Review of Systems     Objective:   Physical Exam        Assessment & Plan:

## 2020-05-27 ENCOUNTER — Encounter: Payer: Self-pay | Admitting: Family Medicine

## 2020-05-27 ENCOUNTER — Telehealth (INDEPENDENT_AMBULATORY_CARE_PROVIDER_SITE_OTHER): Payer: 59 | Admitting: Family Medicine

## 2020-05-27 ENCOUNTER — Other Ambulatory Visit: Payer: Self-pay

## 2020-05-27 DIAGNOSIS — J069 Acute upper respiratory infection, unspecified: Secondary | ICD-10-CM | POA: Diagnosis not present

## 2020-05-27 DIAGNOSIS — Z7189 Other specified counseling: Secondary | ICD-10-CM | POA: Diagnosis not present

## 2020-05-27 MED ORDER — BENZONATATE 100 MG PO CAPS: 100.0000 mg | ORAL_CAPSULE | Freq: Two times a day (BID) | ORAL | 0 refills | Status: DC | PRN

## 2020-05-27 MED ORDER — FLUTICASONE PROPIONATE 50 MCG/ACT NA SUSP: 1.0000 | Freq: Every day | NASAL | 0 refills | Status: DC

## 2020-05-27 NOTE — Progress Notes (Signed)
Virtual Visit via Video Note  I connected with Carlos Wheeler on 05/27/20 at  1:30 PM EDT by a video enabled telemedicine application 2/2 COVID-19 pandemic and verified that I am speaking with the correct person using two identifiers.  Location patient: home Location provider:work or home office Persons participating in the virtual visit: patient, provider  I discussed the limitations of evaluation and management by telemedicine and the availability of in person appointments. The patient expressed understanding and agreed to proceed.   HPI: Pt is a 39 yo male with pmh sig for OSA, asthma, GERD, hypothyroidism, CKD III, obesity, HLD, GAD, elevated LFTs, EtOH use followed by Dr. Ardyth Harps, seen for acute concern.  Pt notes congestion, productive cough,x 3 days.  Pt also has pressure between eyes and rhinorrhea.   Denies fever, ear pain or pressure, sore throat, n/v, wheezing. Taking mucinex, OTC cough meds.  Uses Afrin occasionally.  Pt denies sick contacts.  Pt had COVID-19 in April 2021.  Pt states he typically has a cold like this each yr which requires cough syrup.     ROS: See pertinent positives and negatives per HPI.  Past Medical History:  Diagnosis Date  . Anxiety   . Asthma   . GERD (gastroesophageal reflux disease)   . Sleep apnea     Past Surgical History:  Procedure Laterality Date  . NO PAST SURGERIES      Family History  Problem Relation Age of Onset  . Hyperlipidemia Mother   . Hypertension Mother   . Heart attack Father 49  . Heart disease Father   . Hypertension Father   . Diabetes Sister   . Hyperlipidemia Sister   . Hypertension Sister   . Diabetes Maternal Grandmother   . Hypertension Maternal Grandmother   . Diabetes Maternal Grandfather   . Hypertension Maternal Grandfather   . Heart attack Maternal Grandfather 70  . Diabetes Paternal Grandmother   . Stroke Paternal Grandmother 71  . Diabetes Paternal Grandfather   . Cancer Neg Hx       Current Outpatient Medications:  .  albuterol (VENTOLIN HFA) 108 (90 Base) MCG/ACT inhaler, Inhale 1-2 puffs into the lungs every 6 (six) hours as needed., Disp: 8 g, Rfl: 2 .  atorvastatin (LIPITOR) 40 MG tablet, Take 1 tablet (40 mg total) by mouth daily., Disp: 90 tablet, Rfl: 1 .  clindamycin (CLEOCIN) 300 MG capsule, Take 300 mg by mouth 3 (three) times daily., Disp: , Rfl:  .  hydrOXYzine (ATARAX/VISTARIL) 50 MG tablet, Take 1 tablet (50 mg total) by mouth 3 (three) times daily as needed., Disp: 90 tablet, Rfl: 1 .  levothyroxine (SYNTHROID) 50 MCG tablet, Take 1 tablet (50 mcg total) by mouth daily., Disp: 90 tablet, Rfl: 0 .  levothyroxine (SYNTHROID) 75 MCG tablet, TAKE 1 TABLET BY MOUTH EVERY DAY, Disp: 30 tablet, Rfl: 0 .  LORazepam (ATIVAN) 1 MG tablet, Take 1 tablet (1 mg total) by mouth at bedtime., Disp: 10 tablet, Rfl: 0 .  pantoprazole (PROTONIX) 40 MG tablet, Take 1 tablet (40 mg total) by mouth daily., Disp: 90 tablet, Rfl: 1 .  sertraline (ZOLOFT) 50 MG tablet, TAKE 1 TABLET BY MOUTH EVERY DAY, Disp: 90 tablet, Rfl: 1 .  Vitamin D, Ergocalciferol, (DRISDOL) 1.25 MG (50000 UT) CAPS capsule, Take 1 capsule (50,000 Units total) by mouth every 7 (seven) days., Disp: 4 capsule, Rfl: 1  EXAM:  VITALS per patient if applicable:  RR between 12-20 bpm  GENERAL: alert, oriented, appears well  and in no acute distress  HEENT: atraumatic, conjunctiva clear, no obvious abnormalities on inspection of external nose and ears  NECK: normal movements of the head and neck  LUNGS: Sporadic dry cough.  On inspection no signs of respiratory distress, breathing rate appears normal, no obvious gross SOB, gasping or wheezing  CV: no obvious cyanosis  MS: moves all visible extremities without noticeable abnormality  PSYCH/NEURO: pleasant and cooperative, no obvious depression or anxiety, speech and thought processing grossly intact  ASSESSMENT AND PLAN:  Discussed the following  assessment and plan:  Viral URI with cough - Plan: fluticasone (FLONASE) 50 MCG/ACT nasal spray, benzonatate (TESSALON) 100 MG capsule  Educated about COVID-19 virus infection     I discussed the assessment and treatment plan with the patient. The patient was provided an opportunity to ask questions and all were answered. The patient agreed with the plan and demonstrated an understanding of the instructions.   The patient was advised to call back or seek an in-person evaluation if the symptoms worsen or if the condition fails to improve as anticipated.   Deeann Saint, MD

## 2020-06-02 ENCOUNTER — Other Ambulatory Visit: Payer: Self-pay | Admitting: Internal Medicine

## 2020-06-07 ENCOUNTER — Other Ambulatory Visit: Payer: Self-pay

## 2020-06-07 ENCOUNTER — Telehealth (INDEPENDENT_AMBULATORY_CARE_PROVIDER_SITE_OTHER): Payer: 59 | Admitting: Internal Medicine

## 2020-06-07 ENCOUNTER — Encounter: Payer: Self-pay | Admitting: Internal Medicine

## 2020-06-07 DIAGNOSIS — R053 Chronic cough: Secondary | ICD-10-CM | POA: Diagnosis not present

## 2020-06-07 DIAGNOSIS — R0982 Postnasal drip: Secondary | ICD-10-CM | POA: Diagnosis not present

## 2020-06-07 DIAGNOSIS — Z8616 Personal history of COVID-19: Secondary | ICD-10-CM | POA: Diagnosis not present

## 2020-06-07 DIAGNOSIS — J3089 Other allergic rhinitis: Secondary | ICD-10-CM

## 2020-06-07 MED ORDER — DOXYCYCLINE HYCLATE 100 MG PO TABS: 100.0000 mg | ORAL_TABLET | Freq: Two times a day (BID) | ORAL | 0 refills | Status: DC

## 2020-06-07 MED ORDER — PSEUDOEPH-BROMPHEN-DM 30-2-10 MG/5ML PO SYRP: 5.0000 mL | ORAL_SOLUTION | Freq: Three times a day (TID) | ORAL | 1 refills | Status: DC | PRN

## 2020-06-07 NOTE — Progress Notes (Signed)
Virtual Visit via Video Note  I connected with@ on 06/07/20 at 10:00 AM EDT by a video enabled telemedicine application and verified that I am speaking with the correct person using two identifiers. Location patient: home/work  Location provider:work  office Persons participating in the virtual visit: patient, provider  [place] national recommendations  regarding COVID 19 pandemic   video visit is advised over in office visit for this patient.  Patient aware  of the limitations of evaluation and management by telemedicine and  availability of in person appointments. and agreed to proceed.   HPI: Carlos Wheeler presents for video visit  PCP appt NA  Seen  10 15  And  meds no helping and ongoing  "I know what thisi is and get every year" when weather change  Hx of allergies to everything outside  No fever cp sob  But cough at  noight phelgm is clear  No tobacco CLD hx but has used inhalers in past  Using flonase etc. No pain so much  Asks for cough med either vronfed or  Hydrocodone to help sleep  ROS: See pertinent positives and negatives per HPI.  Past Medical History:  Diagnosis Date  . Anxiety   . Asthma   . GERD (gastroesophageal reflux disease)   . Sleep apnea     Past Surgical History:  Procedure Laterality Date  . NO PAST SURGERIES      Family History  Problem Relation Age of Onset  . Hyperlipidemia Mother   . Hypertension Mother   . Heart attack Father 53  . Heart disease Father   . Hypertension Father   . Diabetes Sister   . Hyperlipidemia Sister   . Hypertension Sister   . Diabetes Maternal Grandmother   . Hypertension Maternal Grandmother   . Diabetes Maternal Grandfather   . Hypertension Maternal Grandfather   . Heart attack Maternal Grandfather 70  . Diabetes Paternal Grandmother   . Stroke Paternal Grandmother 67  . Diabetes Paternal Grandfather   . Cancer Neg Hx     Social History   Tobacco Use  . Smoking status: Never Smoker  . Smokeless  tobacco: Never Used  Vaping Use  . Vaping Use: Never used  Substance Use Topics  . Alcohol use: Not Currently    Alcohol/week: 2.0 standard drinks    Types: 2 Glasses of wine per week    Comment: quit drinking heavily 2 months ago  . Drug use: No      Current Outpatient Medications:  .  levothyroxine (SYNTHROID) 75 MCG tablet, TAKE 1 TABLET BY MOUTH EVERY DAY, Disp: 30 tablet, Rfl: 0 .  albuterol (VENTOLIN HFA) 108 (90 Base) MCG/ACT inhaler, Inhale 1-2 puffs into the lungs every 6 (six) hours as needed., Disp: 8 g, Rfl: 2 .  atorvastatin (LIPITOR) 40 MG tablet, Take 1 tablet (40 mg total) by mouth daily., Disp: 90 tablet, Rfl: 1 .  benzonatate (TESSALON) 100 MG capsule, Take 1 capsule (100 mg total) by mouth 2 (two) times daily as needed for cough., Disp: 20 capsule, Rfl: 0 .  brompheniramine-pseudoephedrine-DM 30-2-10 MG/5ML syrup, Take 5 mLs by mouth 3 (three) times daily as needed (cough at night)., Disp: 180 mL, Rfl: 1 .  clindamycin (CLEOCIN) 300 MG capsule, Take 300 mg by mouth 3 (three) times daily., Disp: , Rfl:  .  doxycycline (VIBRA-TABS) 100 MG tablet, Take 1 tablet (100 mg total) by mouth 2 (two) times daily., Disp: 14 tablet, Rfl: 0 .  fluticasone (FLONASE)  50 MCG/ACT nasal spray, Place 1 spray into both nostrils daily., Disp: 16 g, Rfl: 0 .  hydrOXYzine (ATARAX/VISTARIL) 50 MG tablet, TAKE 1 TABLET (50 MG TOTAL) BY MOUTH 3 (THREE) TIMES DAILY AS NEEDED. (Patient not taking: Reported on 06/07/2020), Disp: 270 tablet, Rfl: 0 .  levothyroxine (SYNTHROID) 50 MCG tablet, Take 1 tablet (50 mcg total) by mouth daily., Disp: 90 tablet, Rfl: 0 .  LORazepam (ATIVAN) 1 MG tablet, Take 1 tablet (1 mg total) by mouth at bedtime. (Patient not taking: Reported on 06/07/2020), Disp: 10 tablet, Rfl: 0 .  pantoprazole (PROTONIX) 40 MG tablet, Take 1 tablet (40 mg total) by mouth daily. (Patient not taking: Reported on 06/07/2020), Disp: 90 tablet, Rfl: 1 .  sertraline (ZOLOFT) 50 MG tablet,  TAKE 1 TABLET BY MOUTH EVERY DAY (Patient not taking: Reported on 06/07/2020), Disp: 90 tablet, Rfl: 1 .  Vitamin D, Ergocalciferol, (DRISDOL) 1.25 MG (50000 UT) CAPS capsule, Take 1 capsule (50,000 Units total) by mouth every 7 (seven) days. (Patient not taking: Reported on 06/07/2020), Disp: 4 capsule, Rfl: 1  EXAM: BP Readings from Last 3 Encounters:  05/13/20 138/64  03/24/20 120/70  11/27/19 110/70    VITALS per patient if applicable:  GENERAL: alert, oriented, appears well and in no acute distress  HEENT: atraumatic, conjunttiva clear, no obvious abnormalities on inspection of external nose and ears non toxic  No resp distress  NECK: normal movements of the head and neck LUNGS: on inspection no signs of respiratory distress, breathing rate appears normal, no obvious gross SOB, gasping or wheezing CV: no obvious cyanosis PSYCH/NEURO: pleasant and cooperative, no obvious depression or anxiety, speech and thought processing grossly intact   ASSESSMENT AND PLAN:  Discussed the following assessment and plan:    ICD-10-CM   1. Cough, persistent  R05.3   2. Post-nasal drainage  R09.82   3. Environmental and seasonal allergies  J30.89    says all to everyt hing outside  fall is sx time  4. History of COVID-19  Z86.16    Seasonal  Cough resp sx  Prolonged at this t ime   Allergy underlying   Asks for cough med that helps either  Hydrocodone or brom fed that helps . And  Antibiotic for prolongs [place] poss   Sinusitis based on hx   No hx of underlying lung diseaes per se but has hx of wheezing remote and had mild case of covid 19 Sars cov2  earlier this year   Denies  resp distress with this.  Counseled.   Expectant management and discussion of plan and treatment with opportunity to ask questions and all were answered. The patient agreed with the plan and demonstrated an understanding of the instructions. Consider disc with pcp about seasonal  meds  For allergy etc  review and  Counsel  and plan 30 minutes  Advised to call back or seek an in-person evaluation if worsening  or having  further concerns . Return if symptoms worsen or fail to improve as expected.    Berniece Andreas, MD

## 2020-06-08 ENCOUNTER — Other Ambulatory Visit: Payer: BC Managed Care – PPO

## 2020-06-10 ENCOUNTER — Other Ambulatory Visit: Payer: 59

## 2020-06-14 ENCOUNTER — Other Ambulatory Visit: Payer: Self-pay

## 2020-06-14 ENCOUNTER — Other Ambulatory Visit (INDEPENDENT_AMBULATORY_CARE_PROVIDER_SITE_OTHER): Payer: 59

## 2020-06-14 DIAGNOSIS — E038 Other specified hypothyroidism: Secondary | ICD-10-CM | POA: Diagnosis not present

## 2020-06-14 LAB — TSH: TSH: 4.82 u[IU]/mL — ABNORMAL HIGH (ref 0.35–4.50)

## 2020-06-14 NOTE — Addendum Note (Signed)
Addended by: Lerry Liner on: 06/14/2020 11:51 AM   Modules accepted: Orders

## 2020-06-20 ENCOUNTER — Encounter: Payer: Self-pay | Admitting: Internal Medicine

## 2020-06-20 DIAGNOSIS — E038 Other specified hypothyroidism: Secondary | ICD-10-CM

## 2020-06-21 ENCOUNTER — Encounter: Payer: Self-pay | Admitting: Internal Medicine

## 2020-06-21 ENCOUNTER — Telehealth: Payer: Self-pay | Admitting: Internal Medicine

## 2020-06-21 MED ORDER — LEVOTHYROXINE SODIUM 75 MCG PO TABS: 75.0000 ug | ORAL_TABLET | Freq: Every day | ORAL | 0 refills | Status: DC

## 2020-06-21 NOTE — Telephone Encounter (Signed)
Pt checking for update. Will provider change dosage of thyroid medication?  Pt needs new script called in. Pt has 2 doses left. Pt request MyChart message response.

## 2020-06-21 NOTE — Telephone Encounter (Signed)
Please advise pt if dosage of thyroid medication will be changed based on lab results. Pt will be out of med in 2 days.

## 2020-06-21 NOTE — Telephone Encounter (Signed)
New Rx sent.  Replied to patient's Mychart message.

## 2020-07-05 IMAGING — CT CT HEAD W/O CM
4 series · 16 of 47 positions shown, 18 images · non-contrast
Comparison: None.

CLINICAL DATA: Slurred speech for 3 days, possible stroke

EXAM:
CT HEAD WITHOUT CONTRAST
TECHNIQUE: Contiguous axial images were obtained from the base of the skull
through the vertex without intravenous contrast.

[Series 3: head without · axial · non-contrast · 0.45mm/px · z∈[-78,+42]mm · 7 of 34 slices shown, 9 images]
[im 5/34  brain]
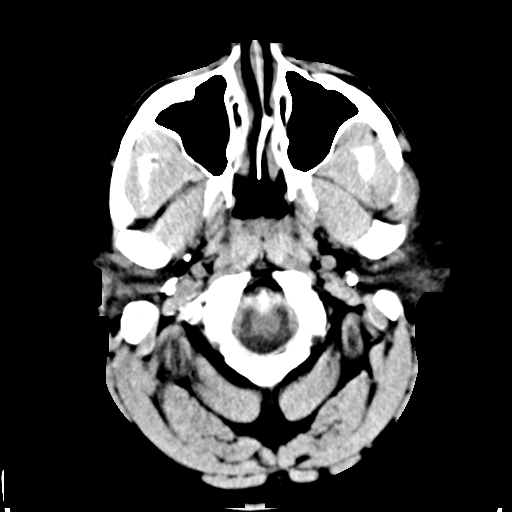
[im 5/34  bone]
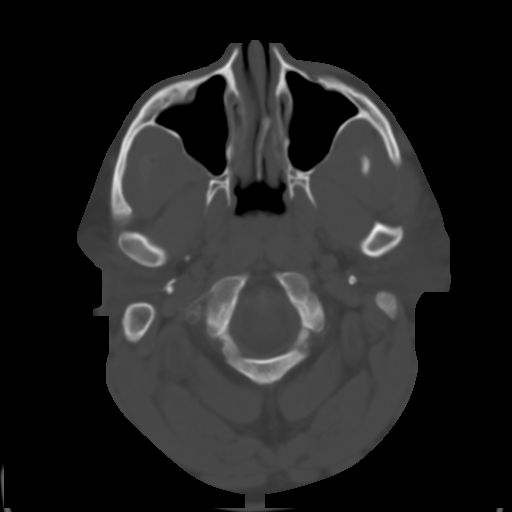
[im 9/34  brain]
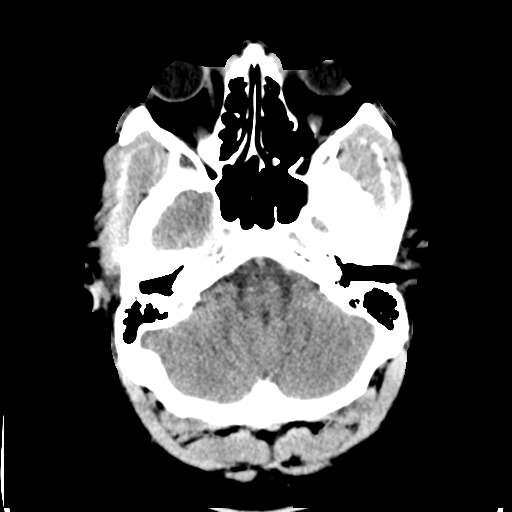
[im 13/34  brain]
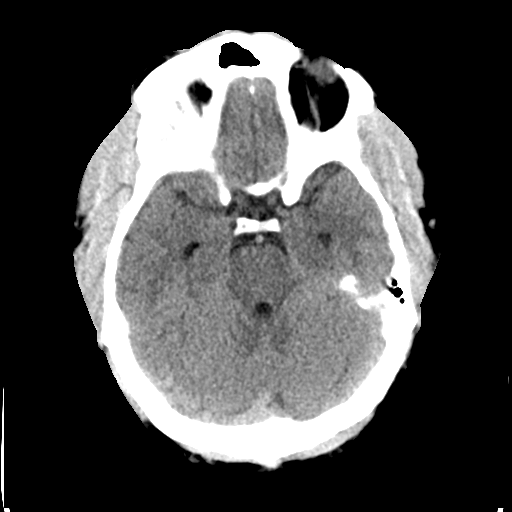
[im 17/34  brain]
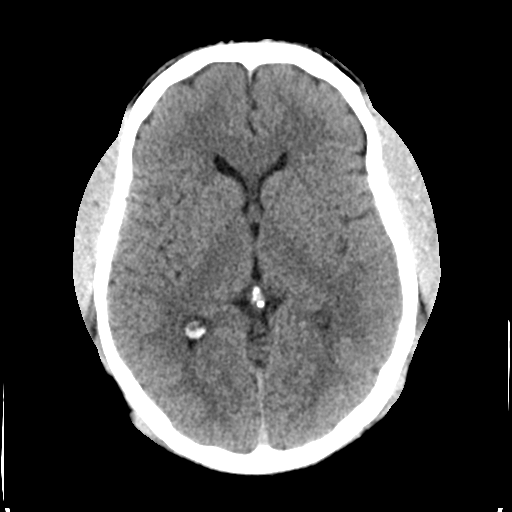
[im 21/34  brain]
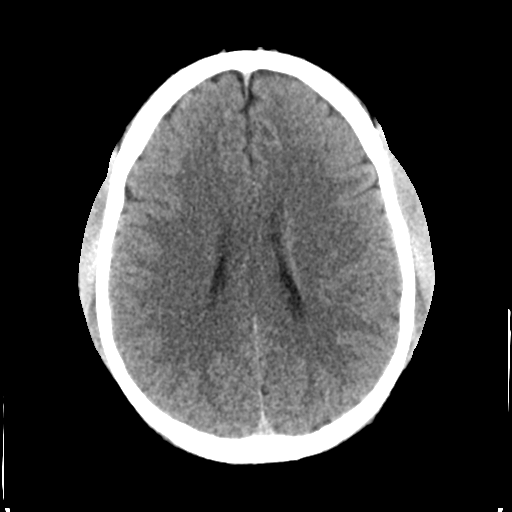
[im 21/34  bone]
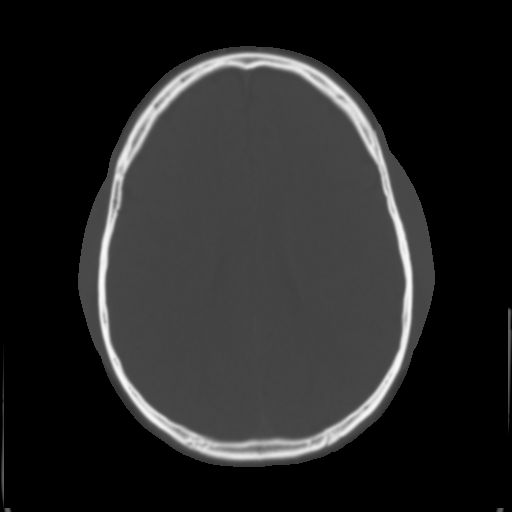
[im 25/34  brain]
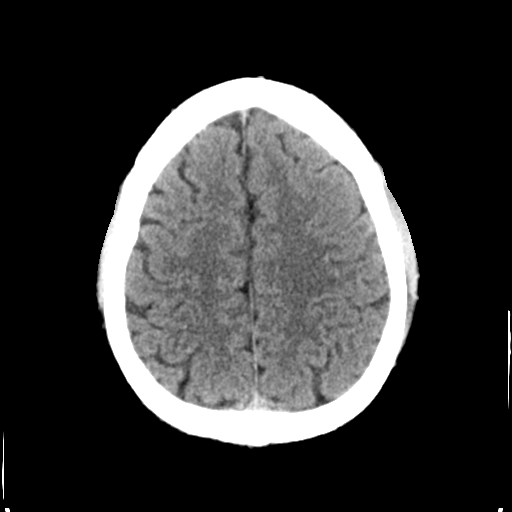
[im 29/34  brain]
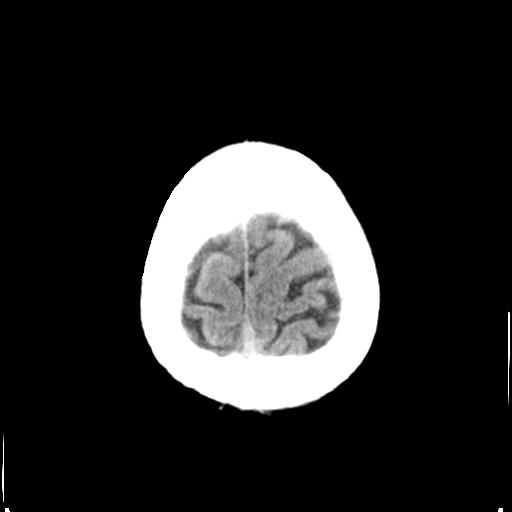

[Series 4: head bone · axial · 0.45mm/px · z∈[-82,-50]mm · 3 of 83 slices shown]
[im 9/83  bone]
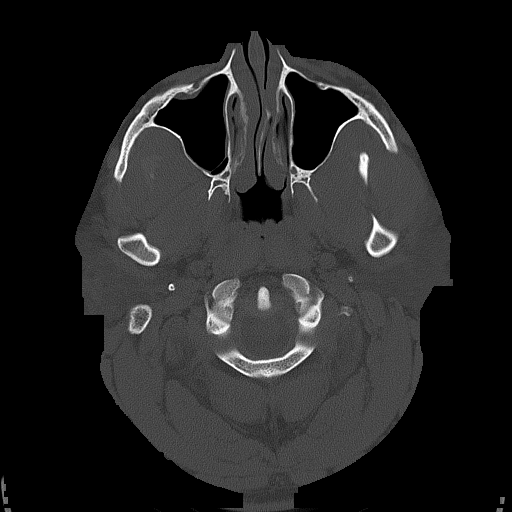
[im 17/83  bone]
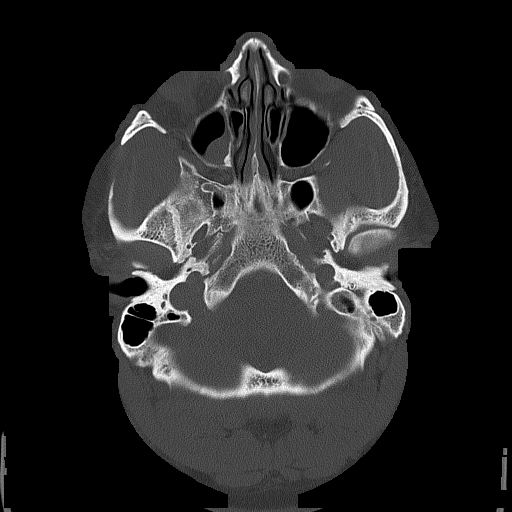
[im 25/83  bone]
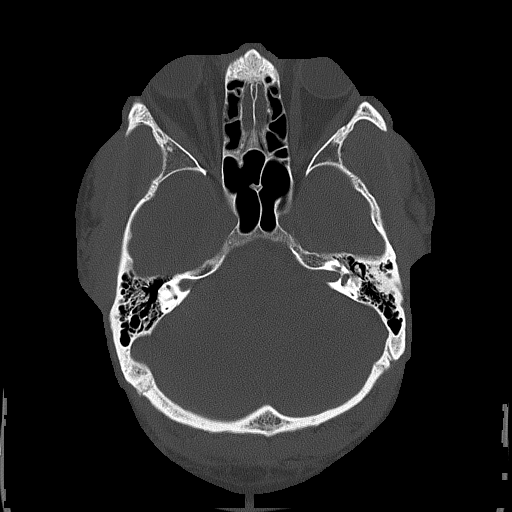

[Series 5: head without cor · coronal · non-contrast · 0.34mm/px · 3 of 69 slices shown]
[im 23/69  brain]
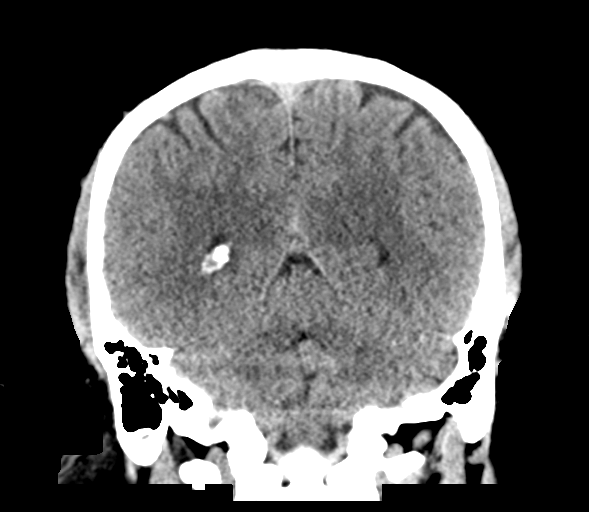
[im 31/69  brain]
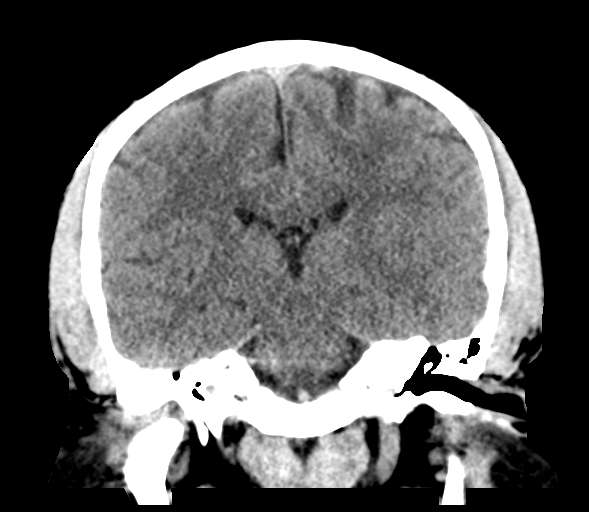
[im 38/69  brain]
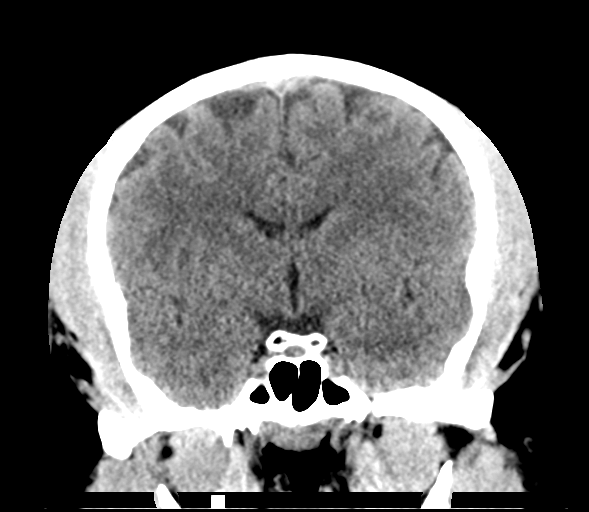

[Series 6: head without sag · sagittal · non-contrast · 0.31mm/px · 3 of 67 slices shown]
[im 23/67  brain]
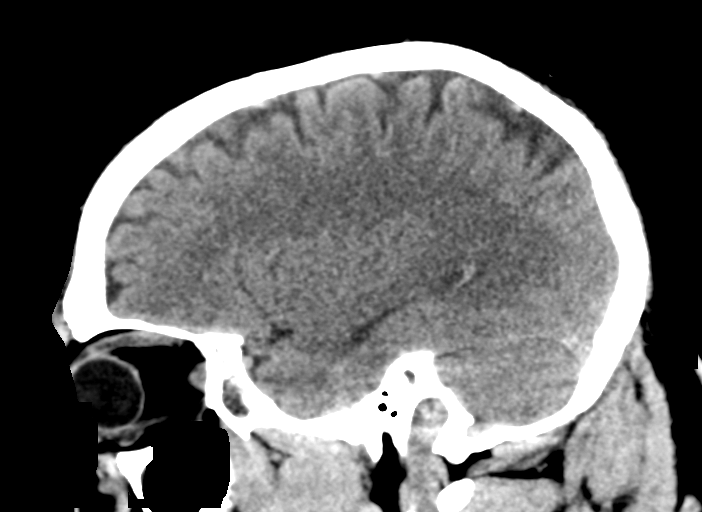
[im 34/67  brain]
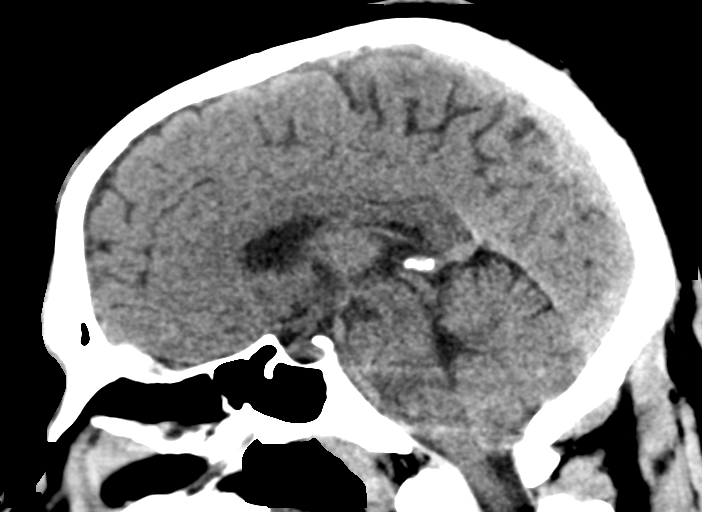
[im 45/67  brain]
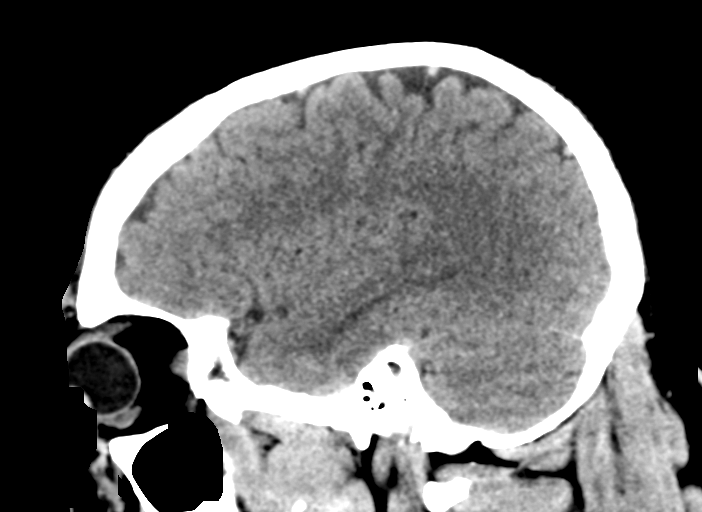

[16 of 47 positions shown; findings below may reference images not displayed]

FINDINGS: Brain: No evidence of acute infarction, hemorrhage, hydrocephalus,
extra-axial collection or mass lesion/mass effect.

Vascular: No hyperdense vessel or unexpected calcification.

Skull: Normal. Negative for fracture or focal lesion.

Sinuses/Orbits: No acute finding.

Other: None.
IMPRESSION: No acute intracranial pathology. No non-contrast CT evidence of
acute stroke or hemorrhage.

## 2020-07-11 ENCOUNTER — Encounter: Payer: Self-pay | Admitting: Internal Medicine

## 2020-07-20 ENCOUNTER — Telehealth (INDEPENDENT_AMBULATORY_CARE_PROVIDER_SITE_OTHER): Payer: 59 | Admitting: Family Medicine

## 2020-07-20 ENCOUNTER — Encounter: Payer: Self-pay | Admitting: Family Medicine

## 2020-07-20 DIAGNOSIS — J0191 Acute recurrent sinusitis, unspecified: Secondary | ICD-10-CM

## 2020-07-20 MED ORDER — PSEUDOEPH-BROMPHEN-DM 30-2-10 MG/5ML PO SYRP: 5.0000 mL | ORAL_SOLUTION | Freq: Four times a day (QID) | ORAL | 0 refills | Status: DC | PRN

## 2020-07-20 MED ORDER — DOXYCYCLINE HYCLATE 100 MG PO TABS: 100.0000 mg | ORAL_TABLET | Freq: Two times a day (BID) | ORAL | 0 refills | Status: DC

## 2020-07-20 NOTE — Progress Notes (Signed)
Subjective:    Patient ID: Carlos Wheeler, male    DOB: [birth year], 39 y.o.   MRN: [id number]  HPI Virtual Visit via Video Note  I connected with the patient on 07/20/20 at  1:45 PM EST by a video enabled telemedicine application and verified that I am speaking with the correct person using two identifiers.  Location patient: home Location provider:work or home office Persons participating in the virtual visit: patient, provider  I discussed the limitations of evaluation and management by telemedicine and the availability of in person appointments. The patient expressed understanding and agreed to proceed.   HPI: Here for recurrent sinus congestion, blowing green mucus from the nose, and coughing up green mucus. No fever. Using his inhaler several times a day. His oxygen sat today was 95%. Using Mucinex and Flonase.    ROS: See pertinent positives and negatives per HPI.  Past Medical History:  Diagnosis Date  . Anxiety   . Asthma   . GERD (gastroesophageal reflux disease)   . Sleep apnea     Past Surgical History:  Procedure Laterality Date  . NO PAST SURGERIES      Family History  Problem Relation Age of Onset  . Hyperlipidemia Mother   . Hypertension Mother   . Heart attack Father 32  . Heart disease Father   . Hypertension Father   . Diabetes Sister   . Hyperlipidemia Sister   . Hypertension Sister   . Diabetes Maternal Grandmother   . Hypertension Maternal Grandmother   . Diabetes Maternal Grandfather   . Hypertension Maternal Grandfather   . Heart attack Maternal Grandfather 70  . Diabetes Paternal Grandmother   . Stroke Paternal Grandmother 81  . Diabetes Paternal Grandfather   . Cancer Neg Hx      Current Outpatient Medications:  .  albuterol (VENTOLIN HFA) 108 (90 Base) MCG/ACT inhaler, Inhale 1-2 puffs into the lungs every 6 (six) hours as needed., Disp: 8 g, Rfl: 2 .  benzonatate (TESSALON) 100 MG capsule, Take 1 capsule (100 mg total) by  mouth 2 (two) times daily as needed for cough., Disp: 20 capsule, Rfl: 0 .  brompheniramine-pseudoephedrine-DM 30-2-10 MG/5ML syrup, Take 5 mLs by mouth 3 (three) times daily as needed (cough at night)., Disp: 180 mL, Rfl: 1 .  fluticasone (FLONASE) 50 MCG/ACT nasal spray, Place 1 spray into both nostrils daily., Disp: 16 g, Rfl: 0 .  levothyroxine (SYNTHROID) 75 MCG tablet, Take 1 tablet (75 mcg total) by mouth daily., Disp: 90 tablet, Rfl: 0 .  atorvastatin (LIPITOR) 40 MG tablet, Take 1 tablet (40 mg total) by mouth daily. (Patient not taking: Reported on 07/20/2020), Disp: 90 tablet, Rfl: 1 .  clindamycin (CLEOCIN) 300 MG capsule, Take 300 mg by mouth 3 (three) times daily. (Patient not taking: Reported on 07/20/2020), Disp: , Rfl:  .  doxycycline (VIBRA-TABS) 100 MG tablet, Take 1 tablet (100 mg total) by mouth 2 (two) times daily. (Patient not taking: Reported on 07/20/2020), Disp: 14 tablet, Rfl: 0 .  hydrOXYzine (ATARAX/VISTARIL) 50 MG tablet, TAKE 1 TABLET (50 MG TOTAL) BY MOUTH 3 (THREE) TIMES DAILY AS NEEDED. (Patient not taking: Reported on 06/07/2020), Disp: 270 tablet, Rfl: 0 .  LORazepam (ATIVAN) 1 MG tablet, Take 1 tablet (1 mg total) by mouth at bedtime. (Patient not taking: Reported on 06/07/2020), Disp: 10 tablet, Rfl: 0 .  pantoprazole (PROTONIX) 40 MG tablet, Take 1 tablet (40 mg total) by mouth daily. (Patient not taking: Reported on 06/07/2020),  Disp: 90 tablet, Rfl: 1 .  sertraline (ZOLOFT) 50 MG tablet, TAKE 1 TABLET BY MOUTH EVERY DAY (Patient not taking: Reported on 06/07/2020), Disp: 90 tablet, Rfl: 1 .  Vitamin D, Ergocalciferol, (DRISDOL) 1.25 MG (50000 UT) CAPS capsule, Take 1 capsule (50,000 Units total) by mouth every 7 (seven) days. (Patient not taking: Reported on 06/07/2020), Disp: 4 capsule, Rfl: 1  EXAM:  VITALS per patient if applicable:  GENERAL: alert, oriented, appears well and in no acute distress  HEENT: atraumatic, conjunttiva clear, no obvious  abnormalities on inspection of external nose and ears  NECK: normal movements of the head and neck  LUNGS: on inspection no signs of respiratory distress, breathing rate appears normal, no obvious gross SOB, gasping or wheezing  CV: no obvious cyanosis  MS: moves all visible extremities without noticeable abnormality  PSYCH/NEURO: pleasant and cooperative, no obvious depression or anxiety, speech and thought processing grossly intact  ASSESSMENT AND PLAN: Recurrent sinusitis, treat with Doxycycline. Use Bromfed as needed for the cough.  Gershon Crane, MD  Discussed the following assessment and plan:  No diagnosis found.     I discussed the assessment and treatment plan with the patient. The patient was provided an opportunity to ask questions and all were answered. The patient agreed with the plan and demonstrated an understanding of the instructions.   The patient was advised to call back or seek an in-person evaluation if the symptoms worsen or if the condition fails to improve as anticipated.     Review of Systems     Objective:   Physical Exam        Assessment & Plan:

## 2020-07-21 ENCOUNTER — Ambulatory Visit: Payer: 59 | Admitting: Internal Medicine

## 2020-07-27 ENCOUNTER — Other Ambulatory Visit: Payer: Self-pay | Admitting: Family Medicine

## 2020-07-27 DIAGNOSIS — J069 Acute upper respiratory infection, unspecified: Secondary | ICD-10-CM

## 2020-08-11 ENCOUNTER — Other Ambulatory Visit: Payer: Self-pay | Admitting: Internal Medicine

## 2020-08-11 DIAGNOSIS — E038 Other specified hypothyroidism: Secondary | ICD-10-CM

## 2020-08-19 ENCOUNTER — Ambulatory Visit: Payer: 59 | Admitting: Internal Medicine

## 2020-08-22 ENCOUNTER — Other Ambulatory Visit: Payer: Self-pay

## 2020-08-22 ENCOUNTER — Ambulatory Visit (INDEPENDENT_AMBULATORY_CARE_PROVIDER_SITE_OTHER): Payer: 59 | Admitting: Family Medicine

## 2020-08-22 ENCOUNTER — Encounter: Payer: Self-pay | Admitting: Family Medicine

## 2020-08-22 VITALS — BP 142/80 | HR 90 | Ht 73.5 in | Wt 290.0 lb

## 2020-08-22 DIAGNOSIS — M25512 Pain in left shoulder: Secondary | ICD-10-CM | POA: Diagnosis not present

## 2020-08-22 DIAGNOSIS — R202 Paresthesia of skin: Secondary | ICD-10-CM

## 2020-08-22 DIAGNOSIS — E038 Other specified hypothyroidism: Secondary | ICD-10-CM | POA: Diagnosis not present

## 2020-08-22 NOTE — Patient Instructions (Signed)
Rotator Cuff Tendinitis  Rotator cuff tendinitis is inflammation of the tendons in the rotator cuff. Tendons are tough, cord-like bands that connect muscle to bone. The rotator cuff includes all of the muscles and tendons that connect the arm to the shoulder. The rotator cuff holds the head of the humerus, or the upper arm bone, in the cup of the shoulder blade (scapula). This condition can lead to a long-term or chronic tear. The tear may be partial or complete. What are the causes? This condition is usually caused by overusing the rotator cuff. What increases the risk? This condition is more likely to develop in athletes and workers who frequently use their shoulder or reach over their heads. This can include activities such as:  Tennis.  Baseball or softball.  Swimming.  Construction work.  Painting. What are the signs or symptoms? Symptoms of this condition include:  Pain that spreads (radiates) from the shoulder to the upper arm.  Swelling and tenderness in front of the shoulder.  Pain when reaching, pulling, or lifting the arm above the head.  Pain when lowering the arm from above the head.  Minor pain in the shoulder when resting.  Increased pain in the shoulder at night.  Difficulty placing the arm behind the back. How is this diagnosed? This condition is diagnosed with a physical exam and medical history. Tests may also be done, including:  X-rays.  MRI.  Ultrasound.  CT with or without contrast. How is this treated? Treatment for this condition depends on the severity of the condition. In less severe cases, treatment may include:  Rest. This may be done with a sling that holds the shoulder still (immobilization). Your health care provider may also recommend avoiding activities that involve lifting your arm over your head.  Icing the shoulder.  Anti-inflammatory medicines, such as aspirin or ibuprofen. In more severe cases, treatment may  include:  Physical therapy.  Steroid injections.  Surgery. Follow these instructions at home: If you have a sling:  Wear the sling as told by your health care provider. Remove it only as told by your health care provider.  Loosen it if your fingers tingle, become numb, or turn cold and blue.  Keep it clean.  If the sling is not waterproof: ? Do not let it get wet. ? Cover it with a watertight covering when you take a bath or shower. Managing pain, stiffness, and swelling  If directed, put ice on the injured area. To do this: ? If you have a removable sling, remove it as told by your health care provider. ? Put ice in a plastic bag. ? Place a towel between your skin and the bag. ? Leave the ice on for 20 minutes, 2-3 times a day.  Move your fingers often to reduce stiffness and swelling.  Raise (elevate) the injured area above the level of your heart while you are lying down.  Find a comfortable sleeping position, or sleep in a recliner, if available.   Activity  Rest your shoulder as told by your health care provider.  Ask your health care provider when it is safe to drive if you have a sling on your arm.  Return to your normal activities as told by your health care provider. Ask your health care provider what activities are safe for you.  Do any exercises or stretches as told by your health care provider or physical therapist.  If you do repetitive overhead tasks, take small breaks in between and include   stretching exercises as told by your health care provider. General instructions  Do not use any products that contain nicotine or tobacco, such as cigarettes, e-cigarettes, and chewing tobacco. These can delay healing. If you need help quitting, ask your health care provider.  Take over-the-counter and prescription medicines only as told by your health care provider.  Keep all follow-up visits as told by your health care provider. This is important. Contact a health  care provider if:  Your pain gets worse.  You have new pain in your arm, hands, or fingers.  Your pain is not relieved with medicine or does not get better after 6 weeks of treatment.  You have crackling sensations when moving your shoulder in certain directions.  You hear a snapping sound after using your shoulder, followed by severe pain and weakness. Get help right away if:  Your arm, hand, or fingers are numb or tingling.  Your arm, hand, or fingers are swollen or painful or they turn white or blue. Summary  Rotator cuff tendinitis is inflammation of the tendons in the rotator cuff. Tendons are tough, cord-like bands that connect muscle to bone.  This condition is usually caused by overusing the rotator cuff, which includes all of the muscles and tendons that connect the arm to the shoulder.  This condition is more likely to develop in athletes and workers who frequently use their shoulder or reach over their heads.  Treatment generally includes rest, anti-inflammatory medicines, and icing. In some cases, physical therapy and steroid injections may be needed. In severe cases, surgery may be needed. This information is not intended to replace advice given to you by your health care provider. Make sure you discuss any questions you have with your health care provider. Document Revised: 05/04/2019 Document Reviewed: 05/04/2019 Elsevier Patient Education  2021 [employer]  

## 2020-08-22 NOTE — Progress Notes (Signed)
Established Patient Office Visit  Subjective:  Patient ID: Carlos Wheeler, male    DOB: 1981/01/08  Age: 40 y.o. MRN: [id number]  CC:  Chief Complaint  Patient presents with  . Arm Pain    HPI Carlos Wheeler presents for the following items  Left shoulder pain for about 1-1/2 months. No recent injury. He was doing some lifting including bench pressing up until onset of symptoms about a month and a half ago. No heavy lifting since then. He has pain left shoulder without significant radiation. He has significant pain with internal rotation and abduction. No weakness. No neck pain. Starting to have more night pain. No relief with over-the-counter nonsteroidals.  Patient has hypothyroidism. He was severely hypothyroid last year and currently on levothyroxine 75 mcg daily. He feels that dose may be "too strong". Last TSH was slightly elevated 4.82. He is requesting repeat level today  Third issue is he describes some paresthesias upper extremities which occur predominantly at night. This is an ulnar nerve distribution with mostly fifth digit and half of the fourth digit. Usually when he changes position this promptly improves. He does have obstructive sleep apnea and sleeps most the time on his back. Denies any weakness of the upper extremity. No elbow pain.  Past Medical History:  Diagnosis Date  . Anxiety   . Asthma   . GERD (gastroesophageal reflux disease)   . Sleep apnea     Past Surgical History:  Procedure Laterality Date  . NO PAST SURGERIES      Family History  Problem Relation Age of Onset  . Hyperlipidemia Mother   . Hypertension Mother   . Heart attack Father 110  . Heart disease Father   . Hypertension Father   . Diabetes Sister   . Hyperlipidemia Sister   . Hypertension Sister   . Diabetes Maternal Grandmother   . Hypertension Maternal Grandmother   . Diabetes Maternal Grandfather   . Hypertension Maternal Grandfather   . Heart attack Maternal  Grandfather 70  . Diabetes Paternal Grandmother   . Stroke Paternal Grandmother 89  . Diabetes Paternal Grandfather   . Cancer Neg Hx     Social History   Socioeconomic History  . Marital status: Married    Spouse name: [place]  . Number of children: 2  . Years of education: MBA  . Highest education level: Not on file  Occupational History  . Not on file  Tobacco Use  . Smoking status: Never Smoker  . Smokeless tobacco: Never Used  Vaping Use  . Vaping Use: Never used  Substance and Sexual Activity  . Alcohol use: Not Currently    Alcohol/week: 2.0 standard drinks    Types: 2 Glasses of wine per week    Comment: quit drinking heavily 2 months ago  . Drug use: No  . Sexual activity: Yes    Birth control/protection: Surgical  Other Topics Concern  . Not on file  Social History Narrative   09/30/18   Lives with wife and 2 children - twin boy (Demarcus) and girl ([name]) - born 2017   Enjoys: drag race, working on cars - trying to start a business   Work: Acupuncturist   Diet: eats good - grilled, avoids fried foods, carbs; does not eat very frequent   Exercise: does cardio every week   Social Determinants of Corporate investment banker Strain: Not on file  Food Insecurity: Not on file  Transportation Needs: Not on file  Physical Activity: Not on file  Stress: Not on file  Social Connections: Not on file  Intimate Partner Violence: Not on file    Outpatient Medications Prior to Visit  Medication Sig Dispense Refill  . levothyroxine (SYNTHROID) 75 MCG tablet TAKE 1 TABLET BY MOUTH EVERY DAY 30 tablet 0  . albuterol (VENTOLIN HFA) 108 (90 Base) MCG/ACT inhaler Inhale 1-2 puffs into the lungs every 6 (six) hours as needed. 8 g 2  . atorvastatin (LIPITOR) 40 MG tablet Take 1 tablet (40 mg total) by mouth daily. (Patient not taking: Reported on 07/20/2020) 90 tablet 1  . benzonatate (TESSALON) 100 MG capsule Take 1 capsule (100 mg total) by mouth 2 (two) times  daily as needed for cough. 20 capsule 0  . brompheniramine-pseudoephedrine-DM 30-2-10 MG/5ML syrup Take 5 mLs by mouth 4 (four) times daily as needed (cough). 240 mL 0  . clindamycin (CLEOCIN) 300 MG capsule Take 300 mg by mouth 3 (three) times daily. (Patient not taking: Reported on 07/20/2020)    . doxycycline (VIBRA-TABS) 100 MG tablet Take 1 tablet (100 mg total) by mouth 2 (two) times daily. 20 tablet 0  . fluticasone (FLONASE) 50 MCG/ACT nasal spray SPRAY 1 SPRAY INTO BOTH NOSTRILS DAILY. 16 mL 1  . hydrOXYzine (ATARAX/VISTARIL) 50 MG tablet TAKE 1 TABLET (50 MG TOTAL) BY MOUTH 3 (THREE) TIMES DAILY AS NEEDED. (Patient not taking: Reported on 06/07/2020) 270 tablet 0  . LORazepam (ATIVAN) 1 MG tablet Take 1 tablet (1 mg total) by mouth at bedtime. (Patient not taking: Reported on 06/07/2020) 10 tablet 0  . pantoprazole (PROTONIX) 40 MG tablet Take 1 tablet (40 mg total) by mouth daily. (Patient not taking: Reported on 06/07/2020) 90 tablet 1  . sertraline (ZOLOFT) 50 MG tablet TAKE 1 TABLET BY MOUTH EVERY DAY (Patient not taking: Reported on 06/07/2020) 90 tablet 1  . Vitamin D, Ergocalciferol, (DRISDOL) 1.25 MG (50000 UT) CAPS capsule Take 1 capsule (50,000 Units total) by mouth every 7 (seven) days. (Patient not taking: Reported on 06/07/2020) 4 capsule 1   No facility-administered medications prior to visit.    Allergies  Allergen Reactions  . Amoxicillin Nausea And Vomiting  . Montelukast Other (See Comments)    Causes forgetfulness     ROS Review of Systems  Constitutional: Negative for chills and fever.  Respiratory: Negative for cough and shortness of breath.   Cardiovascular: Negative for chest pain and leg swelling.  Endocrine: Negative for cold intolerance and heat intolerance.  Musculoskeletal: Negative for neck pain.  Neurological: Negative for weakness.      Objective:    Physical Exam Vitals reviewed.  Constitutional:      Appearance: Normal appearance.   Cardiovascular:     Rate and Rhythm: Normal rate and regular rhythm.  Pulmonary:     Effort: Pulmonary effort is normal.     Breath sounds: Normal breath sounds.  Musculoskeletal:     Right lower leg: No edema.     Left lower leg: No edema.     Comments: Range of motion left shoulder but has significant pain with internal rotation and predominantly abduction against resistance. No biceps tenderness. No acromioclavicular joint tenderness.  Neurological:     Mental Status: He is alert.     BP (!) 142/80   Pulse 90   Ht 6' 1.5" (1.867 m)   Wt 290 lb (131.5 kg)   SpO2 97%   BMI 37.74 kg/m  Wt Readings from Last 3 Encounters:  08/22/20 290  lb (131.5 kg)  05/13/20 292 lb (132.5 kg)  03/24/20 287 lb 8 oz (130.4 kg)     Health Maintenance Due  Topic Date Due  . HIV Screening  Never done  . COVID-19 Vaccine (2 - Pfizer 2-dose series) 03/24/2020    There are no preventive care reminders to display for this patient.  Lab Results  Component Value Date   TSH 4.82 (H) 06/14/2020   Lab Results  Component Value Date   WBC 6.2 08/21/2019   HGB 12.6 (L) 08/21/2019   HCT 38.2 (L) 08/21/2019   MCV 94.7 08/21/2019   PLT 189.0 08/21/2019   Lab Results  Component Value Date   NA 140 11/27/2019   K 4.0 11/27/2019   CO2 31 11/27/2019   GLUCOSE 105 (H) 11/27/2019   BUN 25 (H) 11/27/2019   CREATININE 1.20 11/27/2019   BILITOT 0.4 11/27/2019   ALKPHOS 55 11/27/2019   AST 17 11/27/2019   ALT 15 11/27/2019   PROT 7.1 11/27/2019   ALBUMIN 4.9 11/27/2019   CALCIUM 9.9 11/27/2019   ANIONGAP 6 06/14/2019   GFR 82.52 11/27/2019   Lab Results  Component Value Date   CHOL 218 (H) 10/13/2019   Lab Results  Component Value Date   HDL 88.90 10/13/2019   Lab Results  Component Value Date   LDLCALC 116 (H) 10/13/2019   Lab Results  Component Value Date   TRIG 69.0 10/13/2019   Lab Results  Component Value Date   CHOLHDL 2 10/13/2019   Lab Results  Component Value Date    HGBA1C 6.0 08/21/2019      Assessment & Plan:   #1 left shoulder pain. Suspect likely rotator cuff tendinitis. Symptoms have been progressive and not responsive to icing and over-the-counter non-steroidals -Set up referral to sports medicine for further evaluation  #2 hypothyroidism. Patient on replacement. Requesting repeat TSH today  #3 patient describes very transient paresthesias confined to ulnar nerve distribution. We explained this is likely positional related to sleeping on his back. Reassurance as symptoms promptly improved when he changes position. Follow-up promptly for any weakness or other concerns  No orders of the defined types were placed in this encounter.   Follow-up: No follow-ups on file.    Evelena Peat, MD

## 2020-08-23 LAB — TSH: TSH: 4.38 u[IU]/mL (ref 0.35–4.50)

## 2020-08-23 NOTE — Progress Notes (Unsigned)
Subjective:   I, Carlos Wheeler, LAT, ATC acting as a scribe for [name], MD.  I'm seeing this patient as a consultation for Dr. Evelena Peat. Note will be routed back to referring provider/PCP.  CC: Left shoulder pain  HPI: Pt is a 40yo male c/o L shoulder pain x 1.5 months. Pt had been doing some weight training, including bench press, up until the onset of shoulder symptoms, but no specific lift he can recall as the mechanism. Pt c/o increased pain w/ IR and ABD. Pt locates pain to deep within joint.  Radiating: yes UE numbness/tingling: 4th-5th fingers UE weakness: no Aggravates: wakes him up at night, driving Rx tried: NSAIDs, creams  Past medical history, Surgical history, Family history, Social history, Allergies, and medications have been entered into the medical record, reviewed.   Review of Systems: No new headache, visual changes, nausea, vomiting, diarrhea, constipation, dizziness, abdominal pain, skin rash, fevers, chills, night sweats, weight loss, swollen lymph nodes, body aches, joint swelling, muscle aches, chest pain, shortness of breath, mood changes, visual or auditory hallucinations.   Objective:    Vitals:   08/24/20 1406  BP: 110/78  Pulse: 86  SpO2: 98%   General: Well Developed, well nourished, and in no acute distress.  Neuro/Psych: Alert and oriented x3, extra-ocular muscles intact, able to move all 4 extremities, sensation grossly intact. Skin: Warm and dry, no rashes noted.  Respiratory: Not using accessory muscles, speaking in full sentences, trachea midline.  Cardiovascular: Pulses palpable, no extremity edema. Abdomen: Does not appear distended. MSK: Left shoulder well-developed musculature normal-appearing otherwise. Active range of motion abduction limited to 100 degrees otherwise normal.  Passive abduction full painful arc with positive drop arm sign. Strength intact to internal rotation.  4/5 abduction and 4/5 to external rotation both  with pain. Positive empty can test. Positive Hawkins and Neer's test.  Negative Yergason's and speeds test.  Lab and Radiology Results  X-ray images left shoulder obtained today personally and independently interpreted Normal-appearing bony structures.  No significant degenerative changes.  No fractures. Await formal radiology review  Diagnostic Limited MSK Ultrasound of: Left shoulder Biceps tendon intact normal. Subscapularis tendon is intact and normal. Supraspinatus tendon normal-appearing anterior structures however more posterior portions of the supraspinatus tendon have hypoechoic change within the tendon structures indicating partial tear. Increased thickness subacromial bursa present. Infraspinatus tendon is thin with again appearance of partial tearing possible. AC joint normal-appearing Impression: Probable partial rotator cuff tear of the supraspinatus and infraspinatus tendons.  Subacromial bursitis present.    Impression and Recommendations:    Assessment and Plan: 40 y.o. male with left shoulder pain ongoing for approximately 6 weeks.  This is associated with significant limitation in strength and mobility especially with overhead motion.  Ultrasound examination is concerning for rotator cuff tear.  Plan for MRI to further characterize degree of injury and for potential surgical planning.  In the interim we will go ahead and refer to physical therapy now.  Recheck following MRI if needed.Marland Kitchen  PDMP not reviewed this encounter. Orders Placed This Encounter  Procedures  . [place] LIMITED JOINT SPACE STRUCTURES UP LEFT(NO LINKED CHARGES)    Standing Status:   Future    Number of Occurrences:   1    Standing Expiration Date:   02/21/2021    Order Specific Question:   Reason for Exam (SYMPTOM  OR DIAGNOSIS REQUIRED)    Answer:   left shoulder pain    Order Specific Question:   Preferred  imaging location?    Answer:   [facility]  . DG Shoulder Left     Standing Status:   Future    Number of Occurrences:   1    Standing Expiration Date:   08/24/2021    Order Specific Question:   Reason for Exam (SYMPTOM  OR DIAGNOSIS REQUIRED)    Answer:   left shoulder pain    Order Specific Question:   Preferred imaging location?    Answer:   [facility]  . MR SHOULDER LEFT WO CONTRAST    Standing Status:   Future    Standing Expiration Date:   08/24/2021    Order Specific Question:   What is the patient's sedation requirement?    Answer:   No Sedation    Order Specific Question:   Does the patient have a pacemaker or implanted devices?    Answer:   No    Order Specific Question:   Preferred imaging location?    Answer:   [facility] (table limit-550lbs)  . Ambulatory referral to Physical Therapy    Referral Priority:   Routine    Referral Type:   Physical Medicine    Referral Reason:   Specialty Services Required    Requested Specialty:   Physical Therapy   No orders of the defined types were placed in this encounter.   Discussed warning signs or symptoms. Please see discharge instructions. Patient expresses understanding.   The above documentation has been reviewed and is accurate and complete [name], M.D.

## 2020-08-24 ENCOUNTER — Encounter: Payer: Self-pay | Admitting: Internal Medicine

## 2020-08-24 ENCOUNTER — Ambulatory Visit: Payer: Self-pay

## 2020-08-24 ENCOUNTER — Other Ambulatory Visit: Payer: Self-pay

## 2020-08-24 ENCOUNTER — Encounter: Payer: Self-pay | Admitting: Family Medicine

## 2020-08-24 ENCOUNTER — Ambulatory Visit (INDEPENDENT_AMBULATORY_CARE_PROVIDER_SITE_OTHER): Payer: 59 | Admitting: Family Medicine

## 2020-08-24 ENCOUNTER — Ambulatory Visit (INDEPENDENT_AMBULATORY_CARE_PROVIDER_SITE_OTHER): Payer: 59

## 2020-08-24 VITALS — BP 110/78 | HR 86 | Ht 73.5 in | Wt 293.2 lb

## 2020-08-24 DIAGNOSIS — M25512 Pain in left shoulder: Secondary | ICD-10-CM

## 2020-08-24 NOTE — Patient Instructions (Addendum)
Thank you for coming in today.  Please get an Xray today before you leave  You should hear from MRI scheduling within 1 week. If you do not hear please let me know.   Let me know how you do with PT.   I will offer you to have a follow up appointment after the MRI. Ok to just get the info online and with my notes.

## 2020-08-25 NOTE — Progress Notes (Signed)
X-ray left shoulder is normal to radiology

## 2020-08-27 ENCOUNTER — Other Ambulatory Visit: Payer: Self-pay | Admitting: Internal Medicine

## 2020-08-27 DIAGNOSIS — E038 Other specified hypothyroidism: Secondary | ICD-10-CM

## 2020-09-02 ENCOUNTER — Ambulatory Visit: Payer: 59 | Admitting: Physical Therapy

## 2020-09-06 ENCOUNTER — Encounter: Payer: Self-pay | Admitting: Family Medicine

## 2020-09-06 ENCOUNTER — Ambulatory Visit (INDEPENDENT_AMBULATORY_CARE_PROVIDER_SITE_OTHER): Payer: 59 | Admitting: Family Medicine

## 2020-09-06 ENCOUNTER — Other Ambulatory Visit: Payer: Self-pay

## 2020-09-06 VITALS — BP 138/68 | HR 82 | Temp 98.4°F | Wt 290.0 lb

## 2020-09-06 DIAGNOSIS — L0292 Furuncle, unspecified: Secondary | ICD-10-CM | POA: Diagnosis not present

## 2020-09-06 MED ORDER — DOXYCYCLINE HYCLATE 100 MG PO CAPS: 100.0000 mg | ORAL_CAPSULE | Freq: Two times a day (BID) | ORAL | 0 refills | Status: AC

## 2020-09-06 NOTE — Progress Notes (Signed)
   Subjective:    Patient ID: Carlos Wheeler, male    DOB: [birth year], 40 y.o.   MRN: [id number]  HPI Here for one week of tender boils in the left armpit. One opened and drained a lot of fluid several days ago. He has never had this before. No fever.    Review of Systems  Constitutional: Negative.   Respiratory: Negative.   Cardiovascular: Negative.        Objective:   Physical Exam Constitutional:      Appearance: Normal appearance.  Cardiovascular:     Rate and Rhythm: Normal rate and regular rhythm.     Pulses: Normal pulses.     Heart sounds: Normal heart sounds.  Pulmonary:     Effort: Pulmonary effort is normal.     Breath sounds: Normal breath sounds.  Skin:    Comments: There are 4 tender lumps under the skin in the left axilla. No adenopathy.   Neurological:     Mental Status: He is alert.           Assessment & Plan:  Boils. Treat with Doxycycline for 10 days.  Gershon Crane, MD

## 2020-09-08 ENCOUNTER — Other Ambulatory Visit: Payer: Self-pay

## 2020-09-09 ENCOUNTER — Other Ambulatory Visit: Payer: Self-pay | Admitting: Internal Medicine

## 2020-09-09 ENCOUNTER — Ambulatory Visit (INDEPENDENT_AMBULATORY_CARE_PROVIDER_SITE_OTHER): Payer: 59 | Admitting: Internal Medicine

## 2020-09-09 ENCOUNTER — Encounter: Payer: Self-pay | Admitting: Internal Medicine

## 2020-09-09 VITALS — BP 120/90 | HR 64 | Temp 98.0°F | Ht 73.0 in | Wt 289.6 lb

## 2020-09-09 DIAGNOSIS — E785 Hyperlipidemia, unspecified: Secondary | ICD-10-CM | POA: Diagnosis not present

## 2020-09-09 DIAGNOSIS — E538 Deficiency of other specified B group vitamins: Secondary | ICD-10-CM | POA: Insufficient documentation

## 2020-09-09 DIAGNOSIS — Z0000 Encounter for general adult medical examination without abnormal findings: Secondary | ICD-10-CM | POA: Diagnosis not present

## 2020-09-09 DIAGNOSIS — E559 Vitamin D deficiency, unspecified: Secondary | ICD-10-CM | POA: Insufficient documentation

## 2020-09-09 DIAGNOSIS — E038 Other specified hypothyroidism: Secondary | ICD-10-CM | POA: Diagnosis not present

## 2020-09-09 LAB — COMPREHENSIVE METABOLIC PANEL
ALT: 20 U/L (ref 0–53)
AST: 18 U/L (ref 0–37)
Albumin: 4.9 g/dL (ref 3.5–5.2)
Alkaline Phosphatase: 51 U/L (ref 39–117)
BUN: 18 mg/dL (ref 6–23)
CO2: 30 mEq/L (ref 19–32)
Calcium: 10.3 mg/dL (ref 8.4–10.5)
Chloride: 100 mEq/L (ref 96–112)
Creatinine, Ser: 1.12 mg/dL (ref 0.40–1.50)
GFR: 83.88 mL/min (ref >60.00)
Glucose, Bld: 90 mg/dL (ref 70–99)
Potassium: 4.3 mEq/L (ref 3.5–5.1)
Sodium: 138 mEq/L (ref 135–145)
Total Bilirubin: 0.5 mg/dL (ref 0.2–1.2)
Total Protein: 7.4 g/dL (ref 6.0–8.3)

## 2020-09-09 LAB — LIPID PANEL
Cholesterol: 218 mg/dL — ABNORMAL HIGH (ref 0–200)
HDL: 64.4 mg/dL (ref >39.00)
LDL Cholesterol: 125 mg/dL — ABNORMAL HIGH (ref 0–99)
NonHDL: 153.39
Total CHOL/HDL Ratio: 3
Triglycerides: 142 mg/dL (ref 0.0–149.0)
VLDL: 28.4 mg/dL (ref 0.0–40.0)

## 2020-09-09 LAB — CBC WITH DIFFERENTIAL/PLATELET
Basophils Absolute: 0 10*3/uL (ref 0.0–0.1)
Basophils Relative: 0.5 % (ref 0.0–3.0)
Eosinophils Absolute: 0.1 10*3/uL (ref 0.0–0.7)
Eosinophils Relative: 2.9 % (ref 0.0–5.0)
HCT: 40.4 % (ref 39.0–52.0)
Hemoglobin: 13.5 g/dL (ref 13.0–17.0)
Lymphocytes Relative: 13.3 % (ref 12.0–46.0)
Lymphs Abs: 0.6 10*3/uL — ABNORMAL LOW (ref 0.7–4.0)
MCHC: 33.5 g/dL (ref 30.0–36.0)
MCV: 88.2 fl (ref 78.0–100.0)
Monocytes Absolute: 0.5 10*3/uL (ref 0.1–1.0)
Monocytes Relative: 11.4 % (ref 3.0–12.0)
Neutro Abs: 3.1 10*3/uL (ref 1.4–7.7)
Neutrophils Relative %: 71.9 % (ref 43.0–77.0)
Platelets: 211 10*3/uL (ref 150.0–400.0)
RBC: 4.58 Mil/uL (ref 4.22–5.81)
RDW: 14.1 % (ref 11.5–15.5)
WBC: 4.3 10*3/uL (ref 4.0–10.5)

## 2020-09-09 LAB — TSH: TSH: 5.71 u[IU]/mL — ABNORMAL HIGH (ref 0.35–4.50)

## 2020-09-09 LAB — VITAMIN B12: Vitamin B-12: 171 pg/mL — ABNORMAL LOW (ref 211–911)

## 2020-09-09 LAB — HEMOGLOBIN A1C: Hgb A1c MFr Bld: 6 % (ref 4.6–6.5)

## 2020-09-09 LAB — VITAMIN D 25 HYDROXY (VIT D DEFICIENCY, FRACTURES): VITD: 23.61 ng/mL — ABNORMAL LOW (ref 30.00–100.00)

## 2020-09-09 MED ORDER — VITAMIN D (ERGOCALCIFEROL) 1.25 MG (50000 UNIT) PO CAPS: 50000.0000 [IU] | ORAL_CAPSULE | ORAL | 0 refills | Status: DC

## 2020-09-09 NOTE — Progress Notes (Signed)
Established Patient Office Visit     This visit occurred during the SARS-CoV-2 public health emergency.  Safety protocols were in place, including screening questions prior to the visit, additional usage of staff PPE, and extensive cleaning of exam room while observing appropriate contact time as indicated for disinfecting solutions.    CC/Reason for Visit: Annual preventive exam  HPI: Carlos Wheeler is a 40 y.o. male who is coming in today for the above mentioned reasons. Past Medical History is significant for: Hypothyroidism on levothyroxine, generalized anxiety disorder on Zoloft and Ativan, obstructive sleep apnea and vitamin D deficiency.  Since I last saw him he has had some left shoulder issues.  He has an MRI scheduled for early February, he is currently being followed by Dr. Denyse Amass with sports medicine.  He has had 2 Covid vaccines, is now due for his booster, he declines influenza vaccine.  He has routine dental care but no eye care.  He has been exercising 4 days a week.   Past Medical/Surgical History: Past Medical History:  Diagnosis Date  . Anxiety   . Asthma   . GERD (gastroesophageal reflux disease)   . Sleep apnea     Past Surgical History:  Procedure Laterality Date  . NO PAST SURGERIES      Social History:  reports that he has never smoked. He has never used smokeless tobacco. He reports previous alcohol use of about 2.0 standard drinks of alcohol per week. He reports that he does not use drugs.  Allergies: Allergies  Allergen Reactions  . Amoxicillin Nausea And Vomiting  . Montelukast Other (See Comments)    Causes forgetfulness     Family History:  Family History  Problem Relation Age of Onset  . Hyperlipidemia Mother   . Hypertension Mother   . Heart attack Father 23  . Heart disease Father   . Hypertension Father   . Diabetes Sister   . Hyperlipidemia Sister   . Hypertension Sister   . Diabetes Maternal Grandmother   .  Hypertension Maternal Grandmother   . Diabetes Maternal Grandfather   . Hypertension Maternal Grandfather   . Heart attack Maternal Grandfather 70  . Diabetes Paternal Grandmother   . Stroke Paternal Grandmother 36  . Diabetes Paternal Grandfather   . Cancer Neg Hx      Current Outpatient Medications:  .  doxycycline (VIBRAMYCIN) 100 MG capsule, Take 1 capsule (100 mg total) by mouth 2 (two) times daily for 10 days., Disp: 20 capsule, Rfl: 0 .  levothyroxine (SYNTHROID) 75 MCG tablet, TAKE 1 TABLET BY MOUTH EVERY DAY, Disp: 90 tablet, Rfl: 1  Review of Systems:  Constitutional: Denies fever, chills, diaphoresis, appetite change and fatigue.  HEENT: Denies photophobia, eye pain, redness, hearing loss, ear pain, congestion, sore throat, rhinorrhea, sneezing, mouth sores, trouble swallowing, neck pain, neck stiffness and tinnitus.   Respiratory: Denies SOB, DOE, cough, chest tightness,  and wheezing.   Cardiovascular: Denies chest pain, palpitations and leg swelling.  Gastrointestinal: Denies nausea, vomiting, abdominal pain, diarrhea, constipation, blood in stool and abdominal distention.  Genitourinary: Denies dysuria, urgency, frequency, hematuria, flank pain and difficulty urinating.  Endocrine: Denies: hot or cold intolerance, sweats, changes in hair or nails, polyuria, polydipsia. Musculoskeletal: Denies myalgias, back pain, joint swelling, arthralgias and gait problem.  Skin: Denies pallor, rash and wound.  Neurological: Denies dizziness, seizures, syncope, weakness, light-headedness, numbness and headaches.  Hematological: Denies adenopathy. Easy bruising, personal or family bleeding history  Psychiatric/Behavioral: Denies  suicidal ideation, mood changes, confusion, nervousness, sleep disturbance and agitation    Physical Exam: Vitals:   09/09/20 1006  BP: 120/90  Pulse: 64  Temp: 98 F (36.7 C)  TempSrc: Oral  SpO2: 98%  Weight: 289 lb 9.6 oz (131.4 kg)  Height: 6\' 1"   (1.854 m)    Body mass index is 38.21 kg/m.  Constitutional: NAD, calm, comfortable Eyes: PERRL, lids and conjunctivae normal ENMT: Mucous membranes are moist. Posterior pharynx clear of any exudate or lesions. Normal dentition. Tympanic membrane is pearly white, no erythema or bulging. Neck: normal, supple, no masses, no thyromegaly Respiratory: clear to auscultation bilaterally, no wheezing, no crackles. Normal respiratory effort. No accessory muscle use.  Cardiovascular: Regular rate and rhythm, no murmurs / rubs / gallops. No extremity edema. 2+ pedal pulses. Abdomen: no tenderness, no masses palpated. No hepatosplenomegaly. Bowel sounds positive.  Musculoskeletal: no clubbing / cyanosis. No joint deformity upper and lower extremities. Good ROM, no contractures. Normal muscle tone.  Skin: no rashes, lesions, ulcers. No induration Neurologic: CN 2-12 grossly intact. Sensation intact, DTR normal. Strength 5/5 in all 4.  Psychiatric: Normal judgment and insight. Alert and oriented x 3. Normal mood.    Impression and Plan:  Encounter for preventive health examination -He has routine dental care, have advised routine eye care. -He is now due for his Covid booster and his flu vaccine, both of which he declines today. -Screening labs today. -Healthy lifestyle discussed in detail. -Commence routine prostate cancer screening age 81 and colon cancer screening age 34.  Other specified hypothyroidism  - Plan: TSH -He is currently on levothyroxine 75 mcg daily.  Hyperlipidemia, unspecified hyperlipidemia type  - Plan: Lipid panel -Last LDL was 116 in March 2021, he is not currently on medication.  Vitamin D deficiency -Check levels today.   Patient Instructions   -Nice seeing you today!!  -Lab work today; will notify you once results are available.  -Remember your COVID booster and consider your flu vaccine.  -Schedule follow up in 1 year or sooner as needed.   Preventive  Care 71-81 Years Old, Male Preventive care refers to lifestyle choices and visits with your health care provider that can promote health and wellness. This includes:  A yearly physical exam. This is also called an annual wellness visit.  Regular dental and eye exams.  Immunizations.  Screening for certain conditions.  Healthy lifestyle choices, such as: ? Eating a healthy diet. ? Getting regular exercise. ? Not using drugs or products that contain nicotine and tobacco. ? Limiting alcohol use. What can I expect for my preventive care visit? Physical exam Your health care provider may check your:  Height and weight. These may be used to calculate your BMI (body mass index). BMI is a measurement that tells if you are at a healthy weight.  Heart rate and blood pressure.  Body temperature.  Skin for abnormal spots. Counseling Your health care provider may ask you questions about your:  Past medical problems.  Family's medical history.  Alcohol, tobacco, and drug use.  Emotional well-being.  Home life and relationship well-being.  Sexual activity.  Diet, exercise, and sleep habits.  Work and work 24.  Access to firearms. What immunizations do I need? Vaccines are usually given at various ages, according to a schedule. Your health care provider will recommend vaccines for you based on your age, medical history, and lifestyle or other factors, such as travel or where you work.   What tests do I  need? Blood tests  Lipid and cholesterol levels. These may be checked every 5 years starting at age 20.  Hepatitis C test.  Hepatitis B test. Screening  Diabetes screening. This is done by checking your blood sugar (glucose) after you have not eaten for a while (fasting).  Genital exam to check for testicular cancer or hernias.  STD (sexually transmitted disease) testing, if you are at risk. Talk with your health care provider about your test results, treatment  options, and if necessary, the need for more tests.   Follow these instructions at home: Eating and drinking  Eat a healthy diet that includes fresh fruits and vegetables, whole grains, lean protein, and low-fat dairy products.  Drink enough fluid to keep your urine pale yellow.  Take vitamin and mineral supplements as recommended by your health care provider.  Do not drink alcohol if your health care provider tells you not to drink.  If you drink alcohol: ? Limit how much you have to 0-2 drinks a day. ? Be aware of how much alcohol is in your drink. In the [place] one drink equals one 12 oz bottle of beer (355 mL), one 5 oz glass of wine (148 mL), or one 1 oz glass of hard liquor (44 mL).   Lifestyle  Take daily care of your teeth and gums. Brush your teeth every morning and night with fluoride toothpaste. Floss one time each day.  Stay active. Exercise for at least 30 minutes 5 or more days each week.  Do not use any products that contain nicotine or tobacco, such as cigarettes, e-cigarettes, and chewing tobacco. If you need help quitting, ask your health care provider.  Do not use drugs.  If you are sexually active, practice safe sex. Use a condom or other form of protection to prevent STIs (sexually transmitted infections).  Find healthy ways to cope with stress, such as: ? Meditation, yoga, or listening to music. ? Journaling. ? Talking to a trusted person. ? Spending time with friends and family. Safety  Always wear your seat belt while driving or riding in a vehicle.  Do not drive: ? If you have been drinking alcohol. Do not ride with someone who has been drinking. ? When you are tired or distracted. ? While texting.  Wear a helmet and other protective equipment during sports activities.  If you have firearms in your house, make sure you follow all gun safety procedures.  Seek help if you have been physically or sexually abused. What's next?  Go to your health  care provider once a year for an annual wellness visit.  Ask your health care provider how often you should have your eyes and teeth checked.  Stay up to date on all vaccines. This information is not intended to replace advice given to you by your health care provider. Make sure you discuss any questions you have with your health care provider. Document Revised: 04/15/2019 Document Reviewed: 07/24/2018 Elsevier Patient Education  2021 Elsevier Inc.      Chaya Jan, MD [facility] at [facility]

## 2020-09-09 NOTE — Patient Instructions (Signed)
-Nice seeing you today!!  -Lab work today; will notify you once results are available.  -Remember your COVID booster and consider your flu vaccine.  -Schedule follow up in 1 year or sooner as needed.   Preventive Care 69-38 Years Old, Male Preventive care refers to lifestyle choices and visits with your health care provider that can promote health and wellness. This includes:  A yearly physical exam. This is also called an annual wellness visit.  Regular dental and eye exams.  Immunizations.  Screening for certain conditions.  Healthy lifestyle choices, such as: ? Eating a healthy diet. ? Getting regular exercise. ? Not using drugs or products that contain nicotine and tobacco. ? Limiting alcohol use. What can I expect for my preventive care visit? Physical exam Your health care provider may check your:  Height and weight. These may be used to calculate your BMI (body mass index). BMI is a measurement that tells if you are at a healthy weight.  Heart rate and blood pressure.  Body temperature.  Skin for abnormal spots. Counseling Your health care provider may ask you questions about your:  Past medical problems.  Family's medical history.  Alcohol, tobacco, and drug use.  Emotional well-being.  Home life and relationship well-being.  Sexual activity.  Diet, exercise, and sleep habits.  Work and work Astr[employer].  Access to firearms. What immunizations do I need? Vaccines are usually given at various ages, according to a schedule. Your health care provider will recommend vaccines for you based on your age, medical history, and lifestyle or other factors, such as travel or where you work.   What tests do I need? Blood tests  Lipid and cholesterol levels. These may be checked every 5 years starting at age 16.  Hepatitis C test.  Hepatitis B test. Screening  Diabetes screening. This is done by checking your blood sugar (glucose) after you have not eaten  for a while (fasting).  Genital exam to check for testicular cancer or hernias.  STD (sexually transmitted disease) testing, if you are at risk. Talk with your health care provider about your test results, treatment options, and if necessary, the need for more tests.   Follow these instructions at home: Eating and drinking  Eat a healthy diet that includes fresh fruits and vegetables, whole grains, lean protein, and low-fat dairy products.  Drink enough fluid to keep your urine pale yellow.  Take vitamin and mineral supplements as recommended by your health care provider.  Do not drink alcohol if your health care provider tells you not to drink.  If you drink alcohol: ? Limit how much you have to 0-2 drinks a day. ? Be aware of how much alcohol is in your drink. In the [place] one drink equals one 12 oz bottle of beer (355 mL), one 5 oz glass of wine (148 mL), or one 1 oz glass of hard liquor (44 mL).   Lifestyle  Take daily care of your teeth and gums. Brush your teeth every morning and night with fluoride toothpaste. Floss one time each day.  Stay active. Exercise for at least 30 minutes 5 or more days each week.  Do not use any products that contain nicotine or tobacco, such as cigarettes, e-cigarettes, and chewing tobacco. If you need help quitting, ask your health care provider.  Do not use drugs.  If you are sexually active, practice safe sex. Use a condom or other form of protection to prevent STIs (sexually transmitted infections).  Find healthy  ways to cope with stress, such as: ? Meditation, yoga, or listening to music. ? Journaling. ? Talking to a trusted person. ? Spending time with friends and family. Safety  Always wear your seat belt while driving or riding in a vehicle.  Do not drive: ? If you have been drinking alcohol. Do not ride with someone who has been drinking. ? When you are tired or distracted. ? While texting.  Wear a helmet and other protective  equipment during sports activities.  If you have firearms in your house, make sure you follow all gun safety procedures.  Seek help if you have been physically or sexually abused. What's next?  Go to your health care provider once a year for an annual wellness visit.  Ask your health care provider how often you should have your eyes and teeth checked.  Stay up to date on all vaccines. This information is not intended to replace advice given to you by your health care provider. Make sure you discuss any questions you have with your health care provider. Document Revised: 04/15/2019 Document Reviewed: 07/24/2018 Elsevier Patient Education  2021 [employer].

## 2020-09-12 ENCOUNTER — Encounter: Payer: Self-pay | Admitting: Internal Medicine

## 2020-09-13 ENCOUNTER — Other Ambulatory Visit: Payer: Self-pay | Admitting: Internal Medicine

## 2020-09-13 DIAGNOSIS — E559 Vitamin D deficiency, unspecified: Secondary | ICD-10-CM

## 2020-09-13 DIAGNOSIS — E039 Hypothyroidism, unspecified: Secondary | ICD-10-CM

## 2020-09-13 MED ORDER — CYANOCOBALAMIN 1000 MCG/ML IJ SOLN: INTRAMUSCULAR | 1 refills | Status: DC

## 2020-09-13 MED ORDER — "BD LUER-LOK SYRINGE 22G X 3/4"" 3 ML MISC": 3 refills | Status: DC

## 2020-09-13 NOTE — Telephone Encounter (Signed)
Patient is aware 

## 2020-09-20 ENCOUNTER — Other Ambulatory Visit: Payer: Self-pay

## 2020-09-20 ENCOUNTER — Ambulatory Visit
Admission: RE | Admit: 2020-09-20 | Discharge: 2020-09-20 | Disposition: A | Discharge status: Home / Self Care | Payer: 59 | Source: Ambulatory Visit | Attending: Family Medicine | Admitting: Family Medicine

## 2020-09-20 DIAGNOSIS — M25512 Pain in left shoulder: Secondary | ICD-10-CM

## 2020-09-21 NOTE — Progress Notes (Signed)
MRI does not show rotator cuff tear thankfully.  It does show rotator cuff tendinitis and some bony changes that increase the risk of rotator cuff tendinitis and bursitis.  Recommend that you return to clinic to go over the results in full detail.  Please schedule a follow-up appoint with me in the near future.

## 2020-09-28 NOTE — Progress Notes (Signed)
I, Christoper Fabian, LAT, ATC, am serving as scribe for Dr. [name].  Carlos Wheeler is a 40 y.o. male who presents to [facility] today for f/u of L shoulder pain and L shoulder MRI review.  He was last seen by Dr. Denyse Amass on 08/24/20 and noted L shoulder pain x 1.5 months after doing some weightlifting at the gym.  He was referred for a L shoulder MRI and to PT.  Since his last visit, pt reports that his L shoulder pain remains about the same.  He has con't to go to the gym but states that he has stopped doing overhead lifts which has managed his pain  Diagnostic imaging: L shoulder MRI- 09/20/20; L shoulder XR- 08/24/20   Pertinent review of systems: No fevers or chills  Relevant historical information: Sleep apnea, CKD   Exam:  BP 130/82 (BP Location: Left Arm, Patient Position: Sitting, Cuff Size: Large)   Pulse 76   Ht 6\' 1"  (1.854 m)   Wt 289 lb 3.2 oz (131.2 kg)   SpO2 96%   BMI 38.16 kg/m  General: Well Developed, well nourished, and in no acute distress.   MSK: Left shoulder normal-appearing Nontender. Normal motion pain with abduction. Intact strength.    Lab and Radiology Results  MR SHOULDER LEFT WO CONTRAST  Result Date: 09/21/2020 CLINICAL DATA:  Left shoulder pain and limited range of motion for 3 months. No known injury. EXAM: MRI OF THE LEFT SHOULDER WITHOUT CONTRAST TECHNIQUE: Multiplanar, multisequence MR imaging of the shoulder was performed. No intravenous contrast was administered. COMPARISON:  Plain films left shoulder 08/24/2020. FINDINGS: Rotator cuff: Intact. There is some heterogeneously increased T2 signal in the supraspinatus and infraspinatus tendons consistent with tendinopathy. Muscles:  Normal without atrophy or focal lesion. Biceps long head:  Intact and normal in appearance. 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 Small amount of fluid is present in the subacromial/subdeltoid bursa. Glenohumeral Joint: Negative. Labrum:  Appears intact. Bones:  No fracture or worrisome lesion. Other: None. IMPRESSION: Mild to moderate appearing supraspinatus and infraspinatus tendinopathy without tear. 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 Small volume of subacromial/subdeltoid fluid suggestive of bursitis. Electronically Signed   By: 10/22/2020 M.D.   On: 09/21/2020 11:04   DG Shoulder Left  Result Date: 08/24/2020 CLINICAL DATA:  Left shoulder pain for 2 months. EXAM: LEFT SHOULDER - 2+ VIEW COMPARISON:  None. FINDINGS: There is no evidence of fracture or dislocation. There is no evidence of arthropathy or other focal bone abnormality. Soft tissues are unremarkable. IMPRESSION: Negative. Electronically Signed   By: 10/22/2020 M.D.   On: 08/24/2020 16:59   10/22/2020 LIMITED JOINT SPACE STRUCTURES UP LEFT(NO LINKED CHARGES)  Result Date: 09/09/2020 Diagnostic Limited MSK Ultrasound of: Left shoulder Biceps tendon intact normal. Subscapularis tendon is intact and normal. Supraspinatus tendon normal-appearing anterior structures however more posterior portions of the supraspinatus tendon have hypoechoic change within the tendon structures indicating partial tear. Increased thickness subacromial bursa present. Infraspinatus tendon is thin with again appearance of partial tearing possible. AC joint normal-appearing Impression: Probable partial rotator cuff tear of the supraspinatus and infraspinatus tendons.  Subacromial bursitis present.   I, 09/11/2020, personally (independently) visualized and performed the interpretation of the MRI images attached in this note.  Procedure: Real-time Ultrasound Guided Injection of left subacromial  bursa Device: Philips Affiniti 50G Images permanently stored and available for  review in PACS Verbal informed consent obtained.  Discussed risks and benefits of procedure. Warned about infection bleeding damage to structures skin hypopigmentation and fat atrophy among others. Patient expresses understanding and agreement Time-out conducted.   Noted no overlying erythema, induration, or other signs of local infection.   Skin prepped in a sterile fashion.   Local anesthesia: Topical Ethyl chloride.   With sterile technique and under real time ultrasound guidance:  40 mg of Kenalog and 2 mL of Marcaine injected into subacromial bursa. Fluid seen entering the bursa.   Completed without difficulty   Pain moderately resolved suggesting accurate placement of the medication.   Advised to call if fevers/chills, erythema, induration, drainage, or persistent bleeding.   Images permanently stored and available for review in the ultrasound unit.  Impression: Technically successful ultrasound guided injection.        Assessment and Plan: 40 y.o. male with left shoulder pain due to subacromial bursitis rotator cuff tendinopathy and os acromiale.  Plan for injection as above continued PT.  Recheck in 6 weeks.  If not better consider surgical referral.  Discussed options return sooner if needed.   PDMP not reviewed this encounter. Orders Placed This Encounter  Procedures  . [place] LIMITED JOINT SPACE STRUCTURES UP LEFT(NO LINKED CHARGES)    Order Specific Question:   Reason for Exam (SYMPTOM  OR DIAGNOSIS REQUIRED)    Answer:   L shoulder pain    Order Specific Question:   Preferred imaging location?    Answer:   [facility]   No orders of the defined types were placed in this encounter.    Discussed warning signs or symptoms. Please see discharge instructions. Patient expresses understanding.   The above documentation has been reviewed and is accurate and complete [name], M.D.

## 2020-09-29 ENCOUNTER — Ambulatory Visit (INDEPENDENT_AMBULATORY_CARE_PROVIDER_SITE_OTHER): Payer: 59 | Admitting: Family Medicine

## 2020-09-29 ENCOUNTER — Other Ambulatory Visit: Payer: Self-pay

## 2020-09-29 ENCOUNTER — Encounter: Payer: Self-pay | Admitting: Family Medicine

## 2020-09-29 ENCOUNTER — Ambulatory Visit: Payer: Self-pay

## 2020-09-29 VITALS — BP 130/82 | HR 76 | Ht 73.0 in | Wt 289.2 lb

## 2020-09-29 DIAGNOSIS — M25812 Other specified joint disorders, left shoulder: Secondary | ICD-10-CM | POA: Diagnosis not present

## 2020-09-29 DIAGNOSIS — M7552 Bursitis of left shoulder: Secondary | ICD-10-CM

## 2020-09-29 DIAGNOSIS — M25512 Pain in left shoulder: Secondary | ICD-10-CM

## 2020-09-29 NOTE — Patient Instructions (Signed)
Thank you for coming in today.  Call or go to the ER if you develop a large red swollen joint with extreme pain or oozing puss.   Do the PT.   Recheck in 6 weeks.   If not better we can consider surgery evaluation.

## 2020-10-06 ENCOUNTER — Encounter: Payer: Self-pay | Admitting: Adult Health

## 2020-10-06 ENCOUNTER — Other Ambulatory Visit: Payer: Self-pay

## 2020-10-06 ENCOUNTER — Ambulatory Visit (INDEPENDENT_AMBULATORY_CARE_PROVIDER_SITE_OTHER): Payer: 59 | Admitting: Adult Health

## 2020-10-06 VITALS — BP 126/82 | Temp 98.3°F | Wt 289.0 lb

## 2020-10-06 DIAGNOSIS — L0291 Cutaneous abscess, unspecified: Secondary | ICD-10-CM

## 2020-10-06 MED ORDER — DOXYCYCLINE HYCLATE 100 MG PO CAPS: 100.0000 mg | ORAL_CAPSULE | Freq: Two times a day (BID) | ORAL | 0 refills | Status: DC

## 2020-10-06 NOTE — Progress Notes (Unsigned)
Subjective:    Patient ID: Carlos Wheeler, male    DOB: 10/05/1980, 40 y.o.   MRN: [id number]  HPI 40 year old male who  has a past medical history of Anxiety, Asthma, GERD (gastroesophageal reflux disease), and Sleep apnea.  He is being evaluated today for recurrent abscess.  Has been treated multiple times over the years for an abscess, most recently on September 06, 2020.  He was prescribed doxycycline at this appointment.  Patient reports that he usually he does not finish his antibiotics, after a couple of days of taking them the abscess will start to resolve and he will stop the antibiotics.  Today he reports for an abscess on his right lower leg laterally.  First noticed a couple of days ago.  Area is red and warm and has a small head.   Review of Systems See HPI   Past Medical History:  Diagnosis Date  . Anxiety   . Asthma   . GERD (gastroesophageal reflux disease)   . Sleep apnea     Social History   Socioeconomic History  . Marital status: Married    Spouse name: [place]  . Number of children: 2  . Years of education: MBA  . Highest education level: Not on file  Occupational History  . Not on file  Tobacco Use  . Smoking status: Never Smoker  . Smokeless tobacco: Never Used  Vaping Use  . Vaping Use: Never used  Substance and Sexual Activity  . Alcohol use: Not Currently    Alcohol/week: 2.0 standard drinks    Types: 2 Glasses of wine per week    Comment: quit drinking heavily 2 months ago  . Drug use: No  . Sexual activity: Yes    Birth control/protection: Surgical  Other Topics Concern  . Not on file  Social History Narrative   09/30/18   Lives with wife and 2 children - twin boy (Decker) and girl ([name]) - born 2017   Enjoys: drag race, working on cars - trying to start a business   Work: Acupuncturist   Diet: eats good - grilled, avoids fried foods, carbs; does not eat very frequent   Exercise: does cardio every week   Social  Determinants of Corporate investment banker Strain: Not on file  Food Insecurity: Not on file  Transportation Needs: Not on file  Physical Activity: Not on file  Stress: Not on file  Social Connections: Not on file  Intimate Partner Violence: Not on file    Past Surgical History:  Procedure Laterality Date  . NO PAST SURGERIES      Family History  Problem Relation Age of Onset  . Hyperlipidemia Mother   . Hypertension Mother   . Heart attack Father 65  . Heart disease Father   . Hypertension Father   . Diabetes Sister   . Hyperlipidemia Sister   . Hypertension Sister   . Diabetes Maternal Grandmother   . Hypertension Maternal Grandmother   . Diabetes Maternal Grandfather   . Hypertension Maternal Grandfather   . Heart attack Maternal Grandfather 70  . Diabetes Paternal Grandmother   . Stroke Paternal Grandmother 27  . Diabetes Paternal Grandfather   . Cancer Neg Hx     Allergies  Allergen Reactions  . Amoxicillin Nausea And Vomiting  . Montelukast Other (See Comments)    Causes forgetfulness     Current Outpatient Medications on File Prior to Visit  Medication Sig Dispense Refill  .  cyanocobalamin (,VITAMIN B-12,) 1000 MCG/ML injection Inject 1 ml in the deltoid once a week for a month.  Then inject 1 ml once a month thereafter. 6 mL 1  . levothyroxine (SYNTHROID) 75 MCG tablet TAKE 1 TABLET BY MOUTH EVERY DAY 90 tablet 1  . SYRINGE-NEEDLE, DISP, 3 ML (B-D INTEGRA SYRINGE) 23G X 1" 3 ML MISC Use for b12 injections 100 each 3  . Vitamin D, Ergocalciferol, (DRISDOL) 1.25 MG (50000 UNIT) CAPS capsule Take 1 capsule (50,000 Units total) by mouth every 7 (seven) days for 12 doses. 12 capsule 0   No current facility-administered medications on file prior to visit.    BP 126/82   Temp 98.3 F (36.8 C)   Wt 289 lb (131.1 kg)   BMI 38.13 kg/m       Objective:   Physical Exam Vitals and nursing note reviewed.  Constitutional:      Appearance: Normal  appearance.  Skin:    General: Skin is warm and dry.     Capillary Refill: Capillary refill takes less than 2 seconds.     Findings: Erythema present.     Comments: Over dollar size mostly nonfluctuant abscess with a pinpoint head.  Localized erythema, warmth, and tenderness  Neurological:     General: No focal deficit present.     Mental Status: He is alert and oriented to person, place, and time.  Psychiatric:        Mood and Affect: Mood normal.        Behavior: Behavior normal.        Thought Content: Thought content normal.        Judgment: Judgment normal.       Assessment & Plan:  1. Abscess -Wound culture sent.  Concern for MRSA.  Will cover with doxycycline.  Educated on the importance of finishing his antibiotic therapy.  Encouraged good hygiene  - doxycycline (VIBRAMYCIN) 100 MG capsule; Take 1 capsule (100 mg total) by mouth 2 (two) times daily.  Dispense: 20 capsule; Refill: 0 - WOUND CULTURE  Shirline Frees, NP

## 2020-10-09 LAB — WOUND CULTURE
MICRO NUMBER:: 11575042
SPECIMEN QUALITY:: ADEQUATE

## 2020-10-25 ENCOUNTER — Other Ambulatory Visit: Payer: Self-pay

## 2020-10-25 ENCOUNTER — Other Ambulatory Visit (INDEPENDENT_AMBULATORY_CARE_PROVIDER_SITE_OTHER): Payer: 59

## 2020-10-25 DIAGNOSIS — E559 Vitamin D deficiency, unspecified: Secondary | ICD-10-CM

## 2020-10-25 DIAGNOSIS — E039 Hypothyroidism, unspecified: Secondary | ICD-10-CM

## 2020-10-25 LAB — TSH: TSH: 4.43 u[IU]/mL (ref 0.35–4.50)

## 2020-10-25 LAB — VITAMIN D 25 HYDROXY (VIT D DEFICIENCY, FRACTURES): VITD: 25.8 ng/mL — ABNORMAL LOW (ref 30.00–100.00)

## 2020-11-02 ENCOUNTER — Encounter: Payer: Self-pay | Admitting: Internal Medicine

## 2020-11-07 ENCOUNTER — Other Ambulatory Visit: Payer: Self-pay

## 2020-11-07 ENCOUNTER — Encounter (HOSPITAL_COMMUNITY): Payer: Self-pay

## 2020-11-07 ENCOUNTER — Emergency Department (HOSPITAL_COMMUNITY)
Admission: EM | Admit: 2020-11-07 | Discharge: 2020-11-07 | Disposition: A | Discharge status: Home / Self Care | Payer: 59 | Attending: Emergency Medicine | Admitting: Emergency Medicine

## 2020-11-07 DIAGNOSIS — R079 Chest pain, unspecified: Secondary | ICD-10-CM | POA: Insufficient documentation

## 2020-11-07 DIAGNOSIS — R002 Palpitations: Secondary | ICD-10-CM | POA: Insufficient documentation

## 2020-11-07 DIAGNOSIS — Z5321 Procedure and treatment not carried out due to patient leaving prior to being seen by health care provider: Secondary | ICD-10-CM | POA: Diagnosis not present

## 2020-11-07 NOTE — ED Notes (Signed)
Patient states that he rather his doctor finish up his care. States she can do blood work and chest xray. Patient states since the ekg was shown to the doctor he felt comfortable leaving. Explained that he was leaving without being seen and AMA and he states he is aware.

## 2020-11-07 NOTE — ED Triage Notes (Signed)
Pt states that he is having palpitations and chest pain that woke him up from sleep. Denies nausea/vomiting and SOB.

## 2020-11-11 ENCOUNTER — Other Ambulatory Visit: Payer: Self-pay

## 2020-11-11 ENCOUNTER — Encounter: Payer: Self-pay | Admitting: Family Medicine

## 2020-11-11 ENCOUNTER — Ambulatory Visit (INDEPENDENT_AMBULATORY_CARE_PROVIDER_SITE_OTHER): Payer: 59 | Admitting: Family Medicine

## 2020-11-11 VITALS — BP 128/80 | HR 82 | Temp 98.4°F | Wt 290.0 lb

## 2020-11-11 DIAGNOSIS — L989 Disorder of the skin and subcutaneous tissue, unspecified: Secondary | ICD-10-CM | POA: Diagnosis not present

## 2020-11-11 NOTE — Progress Notes (Signed)
   Subjective:    Patient ID: Carlos Wheeler, male    DOB: 1981/03/05, 40 y.o.   MRN: [id number]  HPI Here to check a red area on the right side that appeared 2 days ago. He has had recurrent boils in the last year, and he is worried this may be another one forming .  The area is not painful or tender, no itching. He was here on 10-06-20 for a boil on the right leg that was lanced, and a culture of the fluid grew MRSA that was sensitive to both Doxycycline and Bactrim. He ws treated with 10 days of Doxycycline and the area cleared up nicely. He recently started working out at [employer] again , and he noticed the red are on his side after doing using a machine that moved his arms back and forth for about 30 minutes.    Review of Systems  Constitutional: Negative.   Respiratory: Negative.   Cardiovascular: Negative.   Skin: Positive for color change.       Objective:   Physical Exam Constitutional:      Appearance: Normal appearance.  Cardiovascular:     Rate and Rhythm: Normal rate and regular rhythm.     Pulses: Normal pulses.     Heart sounds: Normal heart sounds.  Pulmonary:     Effort: Pulmonary effort is normal.     Breath sounds: Normal breath sounds.  Skin:    Comments: The area on the right lower leg where the boil had been is completely clear now. The new area of concern on the right side just inferior to the axilla is a 2 cm area of macular erythema. Nothing is palpable and it is not tender   Neurological:     Mental Status: He is alert.           Assessment & Plan:  The previous boil has resolved. The new area appears to be the result of friction on the skin from the gym workout. I reassured him it is not a new boil. This could potentially be shingles as well, so he will monitor this closely. If he develops new areas of redness or vesicles, he will let us know.  Gershon Crane, MD

## 2020-11-14 ENCOUNTER — Ambulatory Visit (INDEPENDENT_AMBULATORY_CARE_PROVIDER_SITE_OTHER): Payer: 59 | Admitting: Family Medicine

## 2020-11-14 ENCOUNTER — Encounter: Payer: Self-pay | Admitting: Family Medicine

## 2020-11-14 ENCOUNTER — Other Ambulatory Visit: Payer: Self-pay

## 2020-11-14 VITALS — BP 110/70 | HR 87 | Temp 97.9°F

## 2020-11-14 DIAGNOSIS — L0292 Furuncle, unspecified: Secondary | ICD-10-CM

## 2020-11-14 MED ORDER — DOXYCYCLINE HYCLATE 100 MG PO CAPS: 100.0000 mg | ORAL_CAPSULE | Freq: Two times a day (BID) | ORAL | 0 refills | Status: AC

## 2020-11-14 NOTE — Progress Notes (Signed)
   Subjective:    Patient ID: Carlos Wheeler, male    DOB: May 28, 1981, 40 y.o.   MRN: [id number]  HPI Here for several tender spots on the skin. He saw [place] last week for 2 spots on the right flank, and these are still present today. They are mildly painful. He also has a new tedner spot in the right armpit. No fever.    Review of Systems  Constitutional: Negative.   Respiratory: Negative.   Cardiovascular: Negative.   Skin: Positive for wound.       Objective:   Physical Exam Constitutional:      Appearance: Normal appearance.  Cardiovascular:     Rate and Rhythm: Normal rate and regular rhythm.     Pulses: Normal pulses.     Heart sounds: Normal heart sounds.  Pulmonary:     Effort: Pulmonary effort is normal.     Breath sounds: Normal breath sounds.  Skin:    Comments: There are 3 tender boils on the right side, one small one in the right axilla and 2 larger ones on the right flank   Neurological:     Mental Status: He is alert.           Assessment & Plan:  Recurrent boils, possible MRSA. Treat with Doxycycline for 30 days.  Gershon Crane, MD

## 2020-11-24 ENCOUNTER — Other Ambulatory Visit: Payer: Self-pay | Admitting: Internal Medicine

## 2020-11-24 DIAGNOSIS — E559 Vitamin D deficiency, unspecified: Secondary | ICD-10-CM

## 2020-12-13 NOTE — Progress Notes (Signed)
I, Christoper Fabian, LAT, ATC, am serving as scribe for Dr. [name].  Carlos Wheeler is a 40 y.o. male who presents to [facility] today for f/u of L shoulder pain.  He was last seen by Dr. Denyse Amass on 09/29/20 to review his L shoulder MRI and had a L subacromial injection.  He was referred to PT but never completed any sessions.  Since his last visit, pt reports some reports prior steroid injection was beneficial, but pain returned last week. Pt reports he is doing exercises at the gym for his shoulder.   Diagnostic imaging: L shoulder MRI- 09/20/20; L shoulder XR- 08/24/20  Pertinent review of systems: no fever or chills  Relevant historical information: OSA, Hypothyroid   Exam:  BP 140/88 (BP Location: Right Arm, Patient Position: Sitting, Cuff Size: Large)   Pulse 71   Ht 6\' 1"  (1.854 m)   Wt 293 lb (132.9 kg)   SpO2 96%   BMI 38.66 kg/m  General: Well Developed, well nourished, and in no acute distress.   MSK: Left shoulder Normal appearing  Normal motion pain with abduction.  Intact strength. Positive Hawkins and Neer's test.     Lab and Radiology Results  Procedure: Real-time Ultrasound Guided Injection of left shoulder subacromial bursa Device: Philips Affiniti 50G Images permanently stored and available for review in PACS Verbal informed consent obtained.  Discussed risks and benefits of procedure. Warned about infection bleeding damage to structures skin hypopigmentation and fat atrophy among others. Patient expresses understanding and agreement Time-out conducted.   Noted no overlying erythema, induration, or other signs of local infection.   Skin prepped in a sterile fashion.   Local anesthesia: Topical Ethyl chloride.   With sterile technique and under real time ultrasound guidance:  40 mg of Kenalog and 2 mL of Marcaine injected into subacromial bursa. Fluid seen entering the bursa.   Completed without difficulty   Pain  immediately resolved suggesting accurate placement of the medication.   Advised to call if fevers/chills, erythema, induration, drainage, or persistent bleeding.   Images permanently stored and available for review in the ultrasound unit.  Impression: Technically successful ultrasound guided injection.    EXAM: MRI OF THE LEFT SHOULDER WITHOUT CONTRAST  TECHNIQUE: Multiplanar, multisequence MR imaging of the shoulder was performed. No intravenous contrast was administered.  COMPARISON:  Plain films left shoulder 08/24/2020.  FINDINGS: Rotator cuff: Intact. There is some heterogeneously increased T2 signal in the supraspinatus and infraspinatus tendons consistent with tendinopathy.  Muscles:  Normal without atrophy or focal lesion.  Biceps long head:  Intact and normal in appearance.  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 Small amount of fluid is present in the subacromial/subdeltoid bursa.  Glenohumeral Joint: Negative.  Labrum:  Appears intact.  Bones:  No fracture or worrisome lesion.  Other: None.  IMPRESSION: Mild to moderate appearing supraspinatus and infraspinatus tendinopathy without tear.  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  Small volume of subacromial/subdeltoid fluid suggestive of bursitis.   Electronically Signed   By: 10/22/2020 M.D.   On: 09/21/2020 11:04  I, 11/19/2020, personally (independently) visualized and performed the interpretation of the images attached in this note.      Assessment and Plan: 40 y.o. male with left shoulder pain due to subacromial bursitis and rotator cuff tendinopathy.  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 Plan for repeat injection today.   Last injection was about 3 months ago.  Stressed the importance of physical therapy.  Plan for referral to PT today.  Recheck back in about 6 weeks.  If not able to get better surgery may be indicated.   PDMP not reviewed this encounter. Orders Placed This Encounter  Procedures  . [place] LIMITED JOINT SPACE STRUCTURES UP LEFT(NO LINKED CHARGES)    Standing Status:   Future    Number of Occurrences:   1    Standing Expiration Date:   06/16/2021    Order Specific Question:   Reason for Exam (SYMPTOM  OR DIAGNOSIS REQUIRED)    Answer:   left shoulder pain    Order Specific Question:   Preferred imaging location?    Answer:   Adult nurse [facility]  . Ambulatory referral to Physical Therapy    Referral Priority:   Routine    Referral Type:   Physical Medicine    Referral Reason:   Specialty Services Required    Requested Specialty:   Physical Therapy   No orders of the defined types were placed in this encounter.    Discussed warning signs or symptoms. Please see discharge instructions. Patient expresses understanding.   The above documentation has been reviewed and is accurate and complete [name], M.D.

## 2020-12-14 ENCOUNTER — Ambulatory Visit: Payer: Self-pay

## 2020-12-14 ENCOUNTER — Ambulatory Visit (INDEPENDENT_AMBULATORY_CARE_PROVIDER_SITE_OTHER): Payer: 59 | Admitting: Family Medicine

## 2020-12-14 ENCOUNTER — Other Ambulatory Visit: Payer: Self-pay

## 2020-12-14 VITALS — BP 140/88 | HR 71 | Ht 73.0 in | Wt 293.0 lb

## 2020-12-14 DIAGNOSIS — M25512 Pain in left shoulder: Secondary | ICD-10-CM | POA: Diagnosis not present

## 2020-12-14 NOTE — Patient Instructions (Addendum)
Thank you for coming in today.  I've referred you to Physical Therapy.  Let us know if you don't hear from them in one week.  Call or go to the ER if you develop a large red swollen joint with extreme pain or oozing puss.   Recheck with me in 6 weeks.   [place] healthy weight and wellness can help.

## 2020-12-19 ENCOUNTER — Encounter: Payer: Self-pay | Admitting: Internal Medicine

## 2020-12-19 ENCOUNTER — Other Ambulatory Visit: Payer: Self-pay

## 2020-12-19 ENCOUNTER — Ambulatory Visit (INDEPENDENT_AMBULATORY_CARE_PROVIDER_SITE_OTHER): Payer: 59 | Admitting: Internal Medicine

## 2020-12-19 VITALS — BP 122/70 | HR 85 | Temp 97.8°F | Ht 73.0 in | Wt 294.0 lb

## 2020-12-19 DIAGNOSIS — R140 Abdominal distension (gaseous): Secondary | ICD-10-CM

## 2020-12-19 NOTE — Progress Notes (Signed)
Chief Complaint  Patient presents with  . Bloated    Patient complains of bloated feeling, Patient states his stomach is swollen, Xweeks, Patient states he noticed after completing antibiotics.     HPI: Carlos Wheeler 40 y.o. come in for acute sda   PCP appt NA he has noted a change in his bowel function since he has been on multiple courses of antibiotics for his sinusitis. 8 he has not lost any weight but complains of bloating and like things are not moving through as much: he can have a bowel movement daily but sometimes skips and will use a laxative OTC which some help but makes him feel better.there is no vomiting but some burping no blood in stool or diarrhea.  He said he tried a probiotic and he felt it made him worse. Last antibiotic  3 weeks. Ago?  Little blood  ? Hemorrhoids.  In the remote past no blood now Feels that his abdomen is bigger does drink alcohol 1-2 a night but nothing over that. Under treatment for thyroid getting adjustment.  Be having lab work done soon in 3 to 4 weeks.  Grandfather may have had colon and prostate cancer ROS: See pertinent positives and negatives per HPI.  No fever weight loss diarrhea  Past Medical History:  Diagnosis Date  . Anxiety   . Asthma   . GERD (gastroesophageal reflux disease)   . Sleep apnea     Family History  Problem Relation Age of Onset  . Hyperlipidemia Mother   . Hypertension Mother   . Heart attack Father 74  . Heart disease Father   . Hypertension Father   . Diabetes Sister   . Hyperlipidemia Sister   . Hypertension Sister   . Diabetes Maternal Grandmother   . Hypertension Maternal Grandmother   . Diabetes Maternal Grandfather   . Hypertension Maternal Grandfather   . Heart attack Maternal Grandfather 70  . Diabetes Paternal Grandmother   . Stroke Paternal Grandmother 67  . Diabetes Paternal Grandfather   . Cancer Neg Hx     Social History   Socioeconomic History  . Marital status: Married     Spouse name: Carlos Wheeler  . Number of children: 2  . Years of education: MBA  . Highest education level: Not on file  Occupational History  . Not on file  Tobacco Use  . Smoking status: Never Smoker  . Smokeless tobacco: Never Used  Vaping Use  . Vaping Use: Never used  Substance and Sexual Activity  . Alcohol use: Not Currently    Alcohol/week: 2.0 standard drinks    Types: 2 Glasses of wine per week    Comment: quit drinking heavily 2 months ago  . Drug use: No  . Sexual activity: Yes    Birth control/protection: Surgical  Other Topics Concern  . Not on file  Social History Narrative   09/30/18   Lives with wife and 2 children - twin boy (Carlos Wheeler) and girl (Carlos Wheeler) - born 2017   Enjoys: drag race, working on cars - trying to start a business   Work: Acupuncturist   Diet: eats good - grilled, avoids fried foods, carbs; does not eat very frequent   Exercise: does cardio every week   Social Determinants of Corporate investment banker Strain: Not on file  Food Insecurity: Not on file  Transportation Needs: Not on file  Physical Activity: Not on file  Stress: Not on file  Social Connections: Not on  file    Outpatient Medications Prior to Visit  Medication Sig Dispense Refill  . cyanocobalamin (,VITAMIN B-12,) 1000 MCG/ML injection Inject 1 ml in the deltoid once a week for a month.  Then inject 1 ml once a month thereafter. 6 mL 1  . levothyroxine (SYNTHROID) 75 MCG tablet TAKE 1 TABLET BY MOUTH EVERY DAY 90 tablet 1  . SYRINGE-NEEDLE, DISP, 3 ML (B-D INTEGRA SYRINGE) 23G X 1" 3 ML MISC Use for b12 injections 100 each 3  . Vitamin D, Ergocalciferol, (DRISDOL) 1.25 MG (50000 UNIT) CAPS capsule TAKE 1 CAPSULE (50,000 UNITS TOTAL) BY MOUTH EVERY 7 (SEVEN) DAYS FOR 12 DOSES. 12 capsule 0   No facility-administered medications prior to visit.     EXAM:  BP 122/70 (BP Location: Left Arm, Patient Position: Sitting, Cuff Size: Large)   Pulse 85   Temp 97.8 F (36.6 C)  (Oral)   Ht 6\' 1"  (1.854 m)   Wt 294 lb (133.4 kg)   SpO2 97%   BMI 38.79 kg/m   Body mass index is 38.79 kg/m.  GENERAL: vitals reviewed and listed above, alert, oriented, appears well hydrated and in no acute distress HEENT: atraumatic, conjunctiva  clear, no obvious abnormalities on inspection of external nose and ears OP : Masked NECK: no obvious masses on inspection palpation   CV: HRRR, no clubbing cyanosis or  peripheral edema nl cap refill  Abdomen soft without organomegaly obvious no ascites is protuberant bowel sounds present occasional burping no local tenderness MS: moves all extremities without noticeable focal  abnormality PSYCH: pleasant and cooperative, no obvious depression or anxiety Lab Results  Component Value Date   WBC 4.3 09/09/2020   HGB 13.5 09/09/2020   HCT 40.4 09/09/2020   PLT 211.0 09/09/2020   GLUCOSE 90 09/09/2020   CHOL 218 (H) 09/09/2020   TRIG 142.0 09/09/2020   HDL 64.40 09/09/2020   LDLCALC 125 (H) 09/09/2020   ALT 20 09/09/2020   AST 18 09/09/2020   NA 138 09/09/2020   K 4.3 09/09/2020   CL 100 09/09/2020   CREATININE 1.12 09/09/2020   BUN 18 09/09/2020   CO2 30 09/09/2020   TSH 4.43 10/25/2020   INR 1.0 06/04/2019   HGBA1C 6.0 09/09/2020   BP Readings from Last 3 Encounters:  12/19/20 122/70  12/14/20 140/88  11/14/20 110/70    ASSESSMENT AND PLAN:  Discussed the following assessment and plan:  Bloating symptom Onset of GI symptoms seems to be related to multiple courses of antibiotics and certainly could change his bowel microbial environment.  No alarm symptoms otherwise. Close follow-up appropriate because of constipation type symptoms go ahead and take MiraLAX daily until regular.  And trial lactose-free diet in the short run. Plan follow-up with PCP or other in 3 to 4 weeks if not improving or if worsening. -Patient advised to return or notify health care team  if  new concerns arise.  Patient Instructions  I agree  that your symptoms could be related to the multiple antibiotics you had to take and the change in bacteria in your bowel could cause poor digestion. Do 2 to 3 weeks of a lactose-free diet which is excludes milk and milk products in case not digesting it as well.  Is in the short-term. Tried MiraLAX 1 capful for day every day until bowels are regular and soft and cleaned out.  You could retry a probiotic such as align but if it makes you feel worse do not continue.  Keep  a follow-up visit with your PCP in 3 to 4 weeks.  If not improved by then would opine to her about next step.  More evaluation.     Neta Mends. Senya Hinzman M.D.

## 2020-12-19 NOTE — Patient Instructions (Addendum)
I agree that your symptoms could be related to the multiple antibiotics you had to take and the change in bacteria in your bowel could cause poor digestion. Do 2 to 3 weeks of a lactose-free diet which is excludes milk and milk products in case not digesting it as well.  Is in the short-term. Tried MiraLAX 1 capful for day every day until bowels are regular and soft and cleaned out.  You could retry a probiotic such as align but if it makes you feel worse do not continue.  Keep a follow-up visit with your PCP in 3 to 4 weeks.  If not improved by then would opine to her about next step.  More evaluation.

## 2021-01-10 ENCOUNTER — Encounter: Payer: Self-pay | Admitting: Internal Medicine

## 2021-01-10 ENCOUNTER — Other Ambulatory Visit: Payer: 59

## 2021-01-10 ENCOUNTER — Ambulatory Visit (INDEPENDENT_AMBULATORY_CARE_PROVIDER_SITE_OTHER): Payer: 59 | Admitting: Internal Medicine

## 2021-01-10 ENCOUNTER — Other Ambulatory Visit: Payer: Self-pay | Admitting: Internal Medicine

## 2021-01-10 ENCOUNTER — Other Ambulatory Visit: Payer: Self-pay

## 2021-01-10 VITALS — BP 110/80 | HR 70 | Temp 98.0°F | Wt 289.5 lb

## 2021-01-10 DIAGNOSIS — E559 Vitamin D deficiency, unspecified: Secondary | ICD-10-CM

## 2021-01-10 DIAGNOSIS — E038 Other specified hypothyroidism: Secondary | ICD-10-CM | POA: Diagnosis not present

## 2021-01-10 DIAGNOSIS — E538 Deficiency of other specified B group vitamins: Secondary | ICD-10-CM

## 2021-01-10 LAB — VITAMIN D 25 HYDROXY (VIT D DEFICIENCY, FRACTURES): VITD: 26.8 ng/mL — ABNORMAL LOW (ref 30.00–100.00)

## 2021-01-10 LAB — VITAMIN B12: Vitamin B-12: 316 pg/mL (ref 211–911)

## 2021-01-10 LAB — TSH: TSH: 45.49 u[IU]/mL — ABNORMAL HIGH (ref 0.35–4.50)

## 2021-01-10 MED ORDER — VITAMIN D (ERGOCALCIFEROL) 1.25 MG (50000 UNIT) PO CAPS: 50000.0000 [IU] | ORAL_CAPSULE | ORAL | 0 refills | Status: DC

## 2021-01-10 NOTE — Progress Notes (Signed)
Established Patient Office Visit     This visit occurred during the SARS-CoV-2 public health emergency.  Safety protocols were in place, including screening questions prior to the visit, additional usage of staff PPE, and extensive cleaning of exam room while observing appropriate contact time as indicated for disinfecting solutions.    CC/Reason for Visit: Follow-up vitamin D and B12 deficiency, hypothyroidism  HPI: Carlos Wheeler is a 40 y.o. male who is coming in today for the above mentioned reasons.  At last visit his TSH remained elevated, he was also found to have significant vitamin D and B12 deficiencies.  He is still having some mild joint pain which we have thought are related to all 3 of the above.  He is here today for follow-up lab work.  He has no acute complaints today.  Past Medical/Surgical History: Past Medical History:  Diagnosis Date  . Anxiety   . Asthma   . GERD (gastroesophageal reflux disease)   . Sleep apnea     Past Surgical History:  Procedure Laterality Date  . NO PAST SURGERIES      Social History:  reports that he has never smoked. He has never used smokeless tobacco. He reports previous alcohol use of about 2.0 standard drinks of alcohol per week. He reports that he does not use drugs.  Allergies: Allergies  Allergen Reactions  . Amoxicillin Nausea And Vomiting  . Montelukast Other (See Comments)    Causes forgetfulness     Family History:  Family History  Problem Relation Age of Onset  . Hyperlipidemia Mother   . Hypertension Mother   . Heart attack Father 65  . Heart disease Father   . Hypertension Father   . Diabetes Sister   . Hyperlipidemia Sister   . Hypertension Sister   . Diabetes Maternal Grandmother   . Hypertension Maternal Grandmother   . Diabetes Maternal Grandfather   . Hypertension Maternal Grandfather   . Heart attack Maternal Grandfather 70  . Diabetes Paternal Grandmother   . Stroke Paternal  Grandmother 71  . Diabetes Paternal Grandfather   . Cancer Neg Hx      Current Outpatient Medications:  .  cyanocobalamin (,VITAMIN B-12,) 1000 MCG/ML injection, Inject 1 ml in the deltoid once a week for a month.  Then inject 1 ml once a month thereafter., Disp: 6 mL, Rfl: 1 .  levothyroxine (SYNTHROID) 75 MCG tablet, TAKE 1 TABLET BY MOUTH EVERY DAY, Disp: 90 tablet, Rfl: 1 .  SYRINGE-NEEDLE, DISP, 3 ML (B-D INTEGRA SYRINGE) 23G X 1" 3 ML MISC, Use for b12 injections, Disp: 100 each, Rfl: 3 .  Vitamin D, Ergocalciferol, (DRISDOL) 1.25 MG (50000 UNIT) CAPS capsule, TAKE 1 CAPSULE (50,000 UNITS TOTAL) BY MOUTH EVERY 7 (SEVEN) DAYS FOR 12 DOSES., Disp: 12 capsule, Rfl: 0  Review of Systems:  Constitutional: Denies fever, chills, diaphoresis, appetite change and fatigue.  HEENT: Denies photophobia, eye pain, redness, hearing loss, ear pain, congestion, sore throat, rhinorrhea, sneezing, mouth sores, trouble swallowing, neck pain, neck stiffness and tinnitus.   Respiratory: Denies SOB, DOE, cough, chest tightness,  and wheezing.   Cardiovascular: Denies chest pain, palpitations and leg swelling.  Gastrointestinal: Denies nausea, vomiting, abdominal pain, diarrhea, constipation, blood in stool and abdominal distention.  Genitourinary: Denies dysuria, urgency, frequency, hematuria, flank pain and difficulty urinating.  Endocrine: Denies: hot or cold intolerance, sweats, changes in hair or nails, polyuria, polydipsia. Musculoskeletal: Denies myalgias, back pain, joint swelling and gait problem.  Skin:  Denies pallor, rash and wound.  Neurological: Denies dizziness, seizures, syncope, weakness, light-headedness, numbness and headaches.  Hematological: Denies adenopathy. Easy bruising, personal or family bleeding history  Psychiatric/Behavioral: Denies suicidal ideation, mood changes, confusion, nervousness, sleep disturbance and agitation    Physical Exam: Vitals:   01/10/21 1255  BP: 110/80   Pulse: 70  Temp: 98 F (36.7 C)  TempSrc: Oral  SpO2: 97%  Weight: 289 lb 8 oz (131.3 kg)    Body mass index is 38.19 kg/m.   Constitutional: NAD, calm, comfortable Eyes: PERRL, lids and conjunctivae normal ENMT: Mucous membranes are moist.  Respiratory: clear to auscultation bilaterally, no wheezing, no crackles. Normal respiratory effort. No accessory muscle use.  Cardiovascular: Regular rate and rhythm, no murmurs / rubs / gallops. No extremity edema.  Neurologic: Grossly intact and nonfocal Psychiatric: Normal judgment and insight. Alert and oriented x 3. Normal mood.    Impression and Plan:  Vitamin B12 deficiency  - Plan: Vitamin B12  Vitamin D deficiency  - Plan: VITAMIN D 25 Hydroxy (Vit-D Deficiency, Fractures)  Other specified hypothyroidism  - Plan: TSH -Currently on levothyroxine 75 mcg daily.    Chaya Jan, MD [facility]

## 2021-01-11 ENCOUNTER — Other Ambulatory Visit: Payer: Self-pay | Admitting: Internal Medicine

## 2021-01-11 ENCOUNTER — Encounter: Payer: Self-pay | Admitting: Internal Medicine

## 2021-01-11 DIAGNOSIS — R7989 Other specified abnormal findings of blood chemistry: Secondary | ICD-10-CM

## 2021-01-11 DIAGNOSIS — E559 Vitamin D deficiency, unspecified: Secondary | ICD-10-CM

## 2021-01-11 MED ORDER — LEVOTHYROXINE SODIUM 100 MCG PO TABS: 100.0000 ug | ORAL_TABLET | Freq: Every day | ORAL | 1 refills | Status: DC

## 2021-01-11 NOTE — Telephone Encounter (Signed)
Spoke with patient and reviewed lab results. 

## 2021-01-17 ENCOUNTER — Other Ambulatory Visit: Payer: Self-pay

## 2021-01-25 ENCOUNTER — Ambulatory Visit: Payer: 59 | Admitting: Family Medicine

## 2021-01-26 ENCOUNTER — Ambulatory Visit: Payer: 59 | Admitting: Family Medicine

## 2021-01-26 NOTE — Progress Notes (Deleted)
   I, Philbert Riser, LAT, ATC acting as a scribe for [name], MD.  Carlos Wheeler is a 40 y.o. male who presents to [facility] at [facility] today for f/u L shoulder pain. Pt was last seen by Dr. Denyse Amass on 12/14/20 and was given a L subacromial steroid injection and was again referred to PT, but has not completed any visits. Today, pt reports  Dx imaging: 09/20/20 L shoulder MRI  08/24/20 L shoulder XR  Pertinent review of systems: ***  Relevant historical information: ***   Exam:  There were no vitals taken for this visit. General: Well Developed, well nourished, and in no acute distress.   MSK:     Lab and Radiology Results No results found for this or any previous visit (from the past 72 hour(s)). No results found.     Assessment and Plan: 40 y.o. male with ***   PDMP not reviewed this encounter. No orders of the defined types were placed in this encounter.  No orders of the defined types were placed in this encounter.    Discussed warning signs or symptoms. Please see discharge instructions. Patient expresses understanding.

## 2021-01-30 ENCOUNTER — Encounter: Payer: Self-pay | Admitting: Family Medicine

## 2021-01-30 ENCOUNTER — Ambulatory Visit (INDEPENDENT_AMBULATORY_CARE_PROVIDER_SITE_OTHER): Payer: 59 | Admitting: Family Medicine

## 2021-01-30 ENCOUNTER — Other Ambulatory Visit: Payer: Self-pay

## 2021-01-30 VITALS — BP 126/80 | HR 76 | Ht 73.0 in | Wt 290.0 lb

## 2021-01-30 DIAGNOSIS — M25812 Other specified joint disorders, left shoulder: Secondary | ICD-10-CM

## 2021-01-30 DIAGNOSIS — M7552 Bursitis of left shoulder: Secondary | ICD-10-CM | POA: Diagnosis not present

## 2021-01-30 DIAGNOSIS — M25512 Pain in left shoulder: Secondary | ICD-10-CM | POA: Diagnosis not present

## 2021-01-30 NOTE — Progress Notes (Signed)
Wynema Birch, am serving as a Neurosurgeon for Dr. [name].   Carlos Wheeler is a 40 y.o. male who presents to [facility] at [facility] today for f/u L shoulder pain. States that when his thyroid is off he feels the pain more. States the last steroid injection only lasted about 2 weeks. Patient states that the R shoulder is now giving him some trouble. Patient states he has had trouble scheduling with physical therapy.  He is willing to consider surgery for this issue if needed.  Pertinent review of systems: No fevers or chills  Relevant historical information: Thyroid disease   Exam:  BP 126/80 (BP Location: Left Arm, Patient Position: Sitting, Cuff Size: Large)   Pulse 76   Ht 6\' 1"  (1.854 m)   Wt 290 lb (131.5 kg)   SpO2 97%   BMI 38.26 kg/m  General: Well Developed, well nourished, and in no acute distress.   MSK: Left shoulder normal. Normal motion. Nontender. Strength 4/5 abduction and external rotation 5/5 internal rotation. Positive Hawkins and Neer's test positive empty can test. Negative Yergason's and speeds test.  Right shoulder normal motion nontender. Intact strength. Negative empty can test.  Mildly positive Hawkins and Neer's test. Negative Yergason's and speeds test.  Pulses and capillary refill and sensation are intact distally.    Lab and Radiology Results  EXAM: MRI OF THE LEFT SHOULDER WITHOUT CONTRAST   TECHNIQUE: Multiplanar, multisequence MR imaging of the shoulder was performed. No intravenous contrast was administered.   COMPARISON:  Plain films left shoulder 08/24/2020.   FINDINGS: Rotator cuff: Intact. There is some heterogeneously increased T2 signal in the supraspinatus and infraspinatus tendons consistent with tendinopathy.   Muscles:  Normal without atrophy or focal lesion.   Biceps long head:  Intact and normal in appearance.   Acromioclavicular Joint: Mild osteoarthritis. Type 2  acromion. Meso-acromion type os acromiale with mild degenerative change about the synchondrosis is seen. Small amount of fluid is present in the subacromial/subdeltoid bursa.   Glenohumeral Joint: Negative.   Labrum:  Appears intact.   Bones:  No fracture or worrisome lesion.   Other: None.   IMPRESSION: Mild to moderate appearing supraspinatus and infraspinatus tendinopathy without tear.   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   Small volume of subacromial/subdeltoid fluid suggestive of bursitis.     Electronically Signed   By: 10/22/2020 M.D.   On: 09/21/2020 11:04   I, 11/19/2020, personally (independently) visualized and performed the interpretation of the images attached in this note.      Assessment and Plan: 40 y.o. male with left shoulder pain.  This is now becoming a chronic issue.  He has had several injections which only helped for a few weeks to a month or 2.  He has had trials of home exercise program with little benefit.  He has not had a dedicated physical therapy trial but I am not optimistic that will help very much.  At this point especially given his anatomic changes in his shoulder of the os acromiale I think it is likely that he may require surgery.  Before proceeding with more conservative management will refer to orthopedic surgery for consultation.  If further conservative management indicated certainly physical therapy reasonable however I think at this point he may benefit from decompression and distal clavicle excision or possibly fusion of the os acromiale   PDMP not reviewed this encounter.  Orders Placed This Encounter  Procedures   Ambulatory referral to Orthopedic Surgery    Referral Priority:   Routine    Referral Type:   Surgical    Referral Reason:   Specialty Services Required    Requested Specialty:   Orthopedic Surgery    Number of  Visits Requested:   1   No orders of the defined types were placed in this encounter.    Discussed warning signs or symptoms. Please see discharge instructions. Patient expresses understanding.   The above documentation has been reviewed and is accurate and complete [name], M.D.

## 2021-01-30 NOTE — Patient Instructions (Signed)
Thank you for coming in today.   Call orthopedic surgery and schedule the appointment to talk surgeries options.   If they think it is worth trying PT they will.   If they think another would help they will do that too.

## 2021-02-02 ENCOUNTER — Other Ambulatory Visit: Payer: Self-pay | Admitting: Internal Medicine

## 2021-02-10 ENCOUNTER — Other Ambulatory Visit: Payer: Self-pay | Admitting: Internal Medicine

## 2021-02-15 ENCOUNTER — Ambulatory Visit: Payer: 59 | Admitting: Orthopaedic Surgery

## 2021-02-17 ENCOUNTER — Other Ambulatory Visit: Payer: Self-pay | Admitting: Internal Medicine

## 2021-02-17 DIAGNOSIS — E559 Vitamin D deficiency, unspecified: Secondary | ICD-10-CM

## 2021-02-22 ENCOUNTER — Other Ambulatory Visit (INDEPENDENT_AMBULATORY_CARE_PROVIDER_SITE_OTHER): Payer: 59

## 2021-02-22 ENCOUNTER — Other Ambulatory Visit: Payer: Self-pay

## 2021-02-22 DIAGNOSIS — R7989 Other specified abnormal findings of blood chemistry: Secondary | ICD-10-CM | POA: Diagnosis not present

## 2021-02-22 DIAGNOSIS — E559 Vitamin D deficiency, unspecified: Secondary | ICD-10-CM

## 2021-02-22 LAB — VITAMIN D 25 HYDROXY (VIT D DEFICIENCY, FRACTURES): VITD: 27.9 ng/mL — ABNORMAL LOW (ref 30.00–100.00)

## 2021-02-22 LAB — TSH: TSH: 28.14 u[IU]/mL — ABNORMAL HIGH (ref 0.35–5.50)

## 2021-02-23 ENCOUNTER — Other Ambulatory Visit: Payer: Self-pay | Admitting: Internal Medicine

## 2021-02-23 DIAGNOSIS — E038 Other specified hypothyroidism: Secondary | ICD-10-CM

## 2021-02-23 DIAGNOSIS — E559 Vitamin D deficiency, unspecified: Secondary | ICD-10-CM

## 2021-02-23 MED ORDER — LEVOTHYROXINE SODIUM 125 MCG PO TABS: 125.0000 ug | ORAL_TABLET | Freq: Every day | ORAL | 3 refills | Status: DC

## 2021-03-02 ENCOUNTER — Encounter: Payer: Self-pay | Admitting: Orthopaedic Surgery

## 2021-03-02 ENCOUNTER — Ambulatory Visit (INDEPENDENT_AMBULATORY_CARE_PROVIDER_SITE_OTHER): Payer: 59 | Admitting: Orthopaedic Surgery

## 2021-03-02 VITALS — Ht 73.0 in | Wt 285.0 lb

## 2021-03-02 DIAGNOSIS — M7542 Impingement syndrome of left shoulder: Secondary | ICD-10-CM | POA: Diagnosis not present

## 2021-03-02 DIAGNOSIS — M25512 Pain in left shoulder: Secondary | ICD-10-CM

## 2021-03-02 DIAGNOSIS — G8929 Other chronic pain: Secondary | ICD-10-CM | POA: Diagnosis not present

## 2021-03-02 MED ORDER — LIDOCAINE HCL 1 % IJ SOLN
3.0000 mL | INTRAMUSCULAR | Status: AC | PRN
  Administered 2021-03-02: 3 mL

## 2021-03-02 MED ORDER — METHYLPREDNISOLONE ACETATE 40 MG/ML IJ SUSP
40.0000 mg | INTRAMUSCULAR | Status: AC | PRN
  Administered 2021-03-02: 40 mg via INTRA_ARTICULAR

## 2021-03-02 NOTE — Progress Notes (Signed)
Office Visit Note   Patient: Carlos Wheeler           Date of Birth: [birth year]           MRN: [id number] Visit Date: 03/02/2021              Requested by: Rodolph Bong, MD [address] [place],  [place] [address] PCP: Philip Aspen, Limmie Patricia, MD   Assessment & Plan: Visit Diagnoses:  1. Chronic left shoulder pain   2. Impingement syndrome of left shoulder     Plan: We collectively decided to try 1 more steroid injection in the left shoulder subacromial outlet.  He tolerated it well and actually just the lidocaine felt much better.  I would like to see him back in 4 weeks to see if this continues to improve.  1 option at some point would be a left shoulder arthroscopy with subacromial decompression but we will hold off on that until the failure of the least 1 more injection.  He agrees with this treatment plan.  All question concerns were answered and addressed.  Follow-Up Instructions: Return in about 4 weeks (around 03/30/2021).   Orders:  No orders of the defined types were placed in this encounter.  No orders of the defined types were placed in this encounter.     Procedures: Large Joint Inj: L subacromial bursa on 03/02/2021 2:58 PM Indications: pain and diagnostic evaluation Details: 22 G 1.5 in needle  Arthrogram: No  Medications: 3 mL lidocaine 1 %; 40 mg methylPREDNISolone acetate 40 MG/ML Outcome: tolerated well, no immediate complications Procedure, treatment alternatives, risks and benefits explained, specific risks discussed. Consent was given by the patient. Immediately prior to procedure a time out was called to verify the correct patient, procedure, equipment, support staff and site/side marked as required. Patient was prepped and draped in the usual sterile fashion.      Clinical Data: No additional findings.   Subjective: Chief Complaint  Patient presents with   Left Shoulder - Pain  The patient is someone I am seeing for the first time  he is referred from Dr. [name] to evaluate significant left shoulder impingement syndrome.  I have actually reviewed the patient's chart and MRI findings.  He has had at least 2 steroid injections in the left shoulder subacromial outlet but the last one did not help as well but he feels like that may be related to significant thyroid disease that he has.  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  His left shoulder hurts more with overhead activities and reaching behind him.  He is never had shoulder surgery before again he states that the first injection helped greatly but the more recent one which is still been a while has not helped.  He is not a diabetic but does again have thyroid disease.  HPI  Review of Systems There is currently listed no headache, chest pain, shortness of breath, fever, chills, nausea, vomiting  Objective: Vital Signs: Ht 6\' 1"  (1.854 m)   Wt 285 lb (129.3 kg)   BMI 37.60 kg/m   Physical Exam The patient is awake and alert and oriented x3 and in no acute distress Ortho Exam Examination of his left shoulder shows full and excellent range of motion.  He has just slightly decreased strength with external rotation but his abduction strength is 5 out of 5.  His liftoff is negative.  He has  a positive empty cans test and positive Neer and Hawkins sign suggesting significant impingement.  His shoulder is well located with excellent range of motion even with internal rotation combined with adduction. Specialty Comments:  No specialty comments available.  Imaging: No results found. As reviewed of the left shoulder.  It does show mild to moderate tendinopathy of the rotator cuff with no tear.  There is no muscle atrophy.  There is only mild osteoarthritis of the AC joint.  There is a small amount of subacromial and subdeltoid bursitis.  PMFS History: Patient Active Problem List   Diagnosis Date Noted   Os acromiale of left shoulder  09/29/2020   Acute shoulder bursitis, left 09/29/2020   Vitamin D deficiency 09/09/2020   Vitamin B12 deficiency 09/09/2020   Hyperlipidemia 08/25/2019   Hypothyroidism 08/25/2019   IGT (impaired glucose tolerance) 08/25/2019   CKD (chronic kidney disease) stage 3, GFR 30-59 ml/min (HCC) 06/09/2019   Elevated LFTs 06/09/2019   Hand numbness 06/09/2019   Acute kidney failure, unspecified (HCC) 06/09/2019   Loss of taste 05/11/2019   History of heavy alcohol consumption 05/10/2019   Low back pain 04/24/2019   Elevated random blood glucose level 02/05/2019   Anxiety 09/30/2018   Obesity (BMI 30.0-34.9) 12/04/2016   GAD (generalized anxiety disorder) 12/04/2016   Obstructive sleep apnea of adult 08/02/2016   Gastroesophageal reflux disease 05/29/2016   Past Medical History:  Diagnosis Date   Anxiety    Asthma    GERD (gastroesophageal reflux disease)    Sleep apnea     Family History  Problem Relation Age of Onset   Hyperlipidemia Mother    Hypertension Mother    Heart attack Father 71   Heart disease Father    Hypertension Father    Diabetes Sister    Hyperlipidemia Sister    Hypertension Sister    Diabetes Maternal Grandmother    Hypertension Maternal Grandmother    Diabetes Maternal Grandfather    Hypertension Maternal Grandfather    Heart attack Maternal Grandfather 51   Diabetes Paternal Grandmother    Stroke Paternal Grandmother 80   Diabetes Paternal Grandfather    Cancer Neg Hx     Past Surgical History:  Procedure Laterality Date   NO PAST SURGERIES     Social History   Occupational History   Not on file  Tobacco Use   Smoking status: Never   Smokeless tobacco: Never  Vaping Use   Vaping Use: Never used  Substance and Sexual Activity   Alcohol use: Not Currently    Alcohol/week: 2.0 standard drinks    Types: 2 Glasses of wine per week    Comment: quit drinking heavily 2 months ago   Drug use: No   Sexual activity: Yes    Birth  control/protection: Surgical

## 2021-03-10 ENCOUNTER — Telehealth (INDEPENDENT_AMBULATORY_CARE_PROVIDER_SITE_OTHER): Payer: 59 | Admitting: Internal Medicine

## 2021-03-10 VITALS — Wt 289.0 lb

## 2021-03-10 DIAGNOSIS — Z20822 Contact with and (suspected) exposure to covid-19: Secondary | ICD-10-CM | POA: Diagnosis not present

## 2021-03-10 MED ORDER — ALBUTEROL SULFATE HFA 108 (90 BASE) MCG/ACT IN AERS: 1.0000 | INHALATION_SPRAY | Freq: Four times a day (QID) | RESPIRATORY_TRACT | 2 refills | Status: DC | PRN

## 2021-03-10 NOTE — Progress Notes (Signed)
Virtual Visit via Telephone Note  I connected with Watson Robarge Oros on 03/10/21 at  8:30 AM EDT by telephone and verified that I am speaking with the correct person using two identifiers.   I discussed the limitations, risks, security and privacy concerns of performing an evaluation and management service by telephone and the availability of in person appointments. I also discussed with the patient that there may be a patient responsible charge related to this service. The patient expressed understanding and agreed to proceed.  Location patient: home Location provider: work office Participants present for the call: patient, provider Patient did not have a visit in the prior 7 days to address this/these issue(s).   History of Present Illness:  He has scheduled this visit to discuss some acute symptoms.  2 days ago he started having onset of dizziness with what he describes as a sinus headache with pressure in between his eyes.  He has had no fever or shortness of breath, he has had congestion.  He did a COVID test negative that was yesterday.  He has had 2 COVID vaccines.   Observations/Objective: Patient sounds cheerful and well on the phone. I do not appreciate any increased work of breathing. Speech and thought processing are grossly intact. Patient reported vitals: None reported   Current Outpatient Medications:    albuterol (VENTOLIN HFA) 108 (90 Base) MCG/ACT inhaler, Inhale into the lungs every 6 (six) hours as needed for wheezing or shortness of breath., Disp: , Rfl:    cyanocobalamin (,VITAMIN B-12,) 1000 MCG/ML injection, INJECT 1 ML IN THE DELTOID ONCE A WEEK FOR A MONTH. THEN INJECT 1 ML ONCE A MONTH THEREAFTER., Disp: 6 mL, Rfl: 1   levothyroxine (SYNTHROID) 125 MCG tablet, Take 1 tablet (125 mcg total) by mouth daily., Disp: 90 tablet, Rfl: 3   SYRINGE-NEEDLE, DISP, 3 ML (B-D INTEGRA SYRINGE) 23G X 1" 3 ML MISC, Use for b12 injections, Disp: 100 each, Rfl: 3    Vitamin D, Ergocalciferol, (DRISDOL) 1.25 MG (50000 UNIT) CAPS capsule, TAKE 1 CAPSULE (50,000 UNITS TOTAL) BY MOUTH EVERY 7 (SEVEN) DAYS FOR 12 DOSES., Disp: 12 capsule, Rfl: 0  Review of Systems:  Constitutional: Denies fever, chills, diaphoresis, appetite change.  HEENT: Denies photophobia, eye pain, redness, hearing loss, e, mouth sores, trouble swallowing, neck pain, neck stiffness and tinnitus.   Respiratory: Denies SOB, DOE, cough, chest tightness,  and wheezing.   Cardiovascular: Denies chest pain, palpitations and leg swelling.  Gastrointestinal: Denies nausea, vomiting, abdominal pain, diarrhea, constipation, blood in stool and abdominal distention.  Genitourinary: Denies dysuria, urgency, frequency, hematuria, flank pain and difficulty urinating.  Endocrine: Denies: hot or cold intolerance, sweats, changes in hair or nails, polyuria, polydipsia. Musculoskeletal: Denies myalgias, back pain, joint swelling, arthralgias and gait problem.  Skin: Denies pallor, rash and wound.  Neurological: Denies dizziness, seizures, syncope, weakness, light-headedness, numbness and headaches.  Hematological: Denies adenopathy. Easy bruising, personal or family bleeding history  Psychiatric/Behavioral: Denies suicidal ideation, mood changes, confusion, nervousness, sleep disturbance and agitation   Assessment and Plan:  Suspected COVID-19 virus infection -Despite a negative COVID test, I am not fully ruling out possible COVID-19 as I believe he may have tested too early in the disease course. -Have advised that he test again later today or tomorrow and advise [place] of any positive result. -In the meantime have advised over-the-counter treatment with pain relievers, antihistamines, decongestants and guaifenesin for the next 5 days or so to relieve symptoms.   I discussed  the assessment and treatment plan with the patient. The patient was provided an opportunity to ask questions and all were answered.  The patient agreed with the plan and demonstrated an understanding of the instructions.   The patient was advised to call back or seek an in-person evaluation if the symptoms worsen or if the condition fails to improve as anticipated.  I provided 13 minutes of non-face-to-face time during this encounter.   Chaya Jan, MD [facility]

## 2021-03-14 ENCOUNTER — Other Ambulatory Visit: Payer: Self-pay

## 2021-03-14 ENCOUNTER — Encounter: Payer: Self-pay | Admitting: Internal Medicine

## 2021-03-14 ENCOUNTER — Ambulatory Visit (INDEPENDENT_AMBULATORY_CARE_PROVIDER_SITE_OTHER): Payer: 59 | Admitting: Internal Medicine

## 2021-03-14 VITALS — BP 120/80 | HR 81 | Temp 98.2°F | Wt 291.6 lb

## 2021-03-14 DIAGNOSIS — J069 Acute upper respiratory infection, unspecified: Secondary | ICD-10-CM | POA: Diagnosis not present

## 2021-03-14 NOTE — Progress Notes (Signed)
Acute   Office Visit     This visit occurred during the SARS-CoV-2 public health emergency.  Safety protocols were in place, including screening questions prior to the visit, additional usage of staff PPE, and extensive cleaning of exam room while observing appropriate contact time as indicated for disinfecting solutions.    CC/Reason for Visit: Sinus pressure  HPI: Carlos Wheeler is a 40 y.o. male who is coming in today for the above mentioned reasons. See on 7/29 for sinus pressure and congestion. Advised OTC meds but has not taken yet. Still with sinus pressure and HA. No other symptoms. Has been COVID negative x 2.   Past Medical/Surgical History: Past Medical History:  Diagnosis Date   Anxiety    Asthma    GERD (gastroesophageal reflux disease)    Sleep apnea     Past Surgical History:  Procedure Laterality Date   NO PAST SURGERIES      Social History:  reports that he has never smoked. He has never used smokeless tobacco. He reports previous alcohol use of about 2.0 standard drinks of alcohol per week. He reports that he does not use drugs.  Allergies: Allergies  Allergen Reactions   Amoxicillin Nausea And Vomiting   Montelukast Other (See Comments)    Causes forgetfulness     Family History:  Family History  Problem Relation Age of Onset   Hyperlipidemia Mother    Hypertension Mother    Heart attack Father 18   Heart disease Father    Hypertension Father    Diabetes Sister    Hyperlipidemia Sister    Hypertension Sister    Diabetes Maternal Grandmother    Hypertension Maternal Grandmother    Diabetes Maternal Grandfather    Hypertension Maternal Grandfather    Heart attack Maternal Grandfather 26   Diabetes Paternal Grandmother    Stroke Paternal Grandmother 56   Diabetes Paternal Grandfather    Cancer Neg Hx      Current Outpatient Medications:    albuterol (VENTOLIN HFA) 108 (90 Base) MCG/ACT inhaler, Inhale 1 puff into the lungs  every 6 (six) hours as needed for wheezing or shortness of breath., Disp: 18 g, Rfl: 2   cyanocobalamin (,VITAMIN B-12,) 1000 MCG/ML injection, INJECT 1 ML IN THE DELTOID ONCE A WEEK FOR A MONTH. THEN INJECT 1 ML ONCE A MONTH THEREAFTER., Disp: 6 mL, Rfl: 1   levothyroxine (SYNTHROID) 125 MCG tablet, Take 1 tablet (125 mcg total) by mouth daily., Disp: 90 tablet, Rfl: 3   SYRINGE-NEEDLE, DISP, 3 ML (B-D INTEGRA SYRINGE) 23G X 1" 3 ML MISC, Use for b12 injections, Disp: 100 each, Rfl: 3   Vitamin D, Ergocalciferol, (DRISDOL) 1.25 MG (50000 UNIT) CAPS capsule, TAKE 1 CAPSULE (50,000 UNITS TOTAL) BY MOUTH EVERY 7 (SEVEN) DAYS FOR 12 DOSES., Disp: 12 capsule, Rfl: 0  Review of Systems:  Constitutional: Denies fever, chills, diaphoresis, appetite change and fatigue.  HEENT: Denies photophobia, eye pain, redness,  mouth sores, trouble swallowing, neck pain, neck stiffness and tinnitus.   Respiratory: Denies SOB, DOE, cough, chest tightness,  and wheezing.   Cardiovascular: Denies chest pain, palpitations and leg swelling.  Gastrointestinal: Denies nausea, vomiting, abdominal pain, diarrhea, constipation, blood in stool and abdominal distention.  Genitourinary: Denies dysuria, urgency, frequency, hematuria, flank pain and difficulty urinating.  Endocrine: Denies: hot or cold intolerance, sweats, changes in hair or nails, polyuria, polydipsia. Musculoskeletal: Denies myalgias, back pain, joint swelling, arthralgias and gait problem.  Skin: Denies pallor,  rash and wound.  Neurological: Denies dizziness, seizures, syncope, weakness, light-headedness, numbness and headaches.  Hematological: Denies adenopathy. Easy bruising, personal or family bleeding history  Psychiatric/Behavioral: Denies suicidal ideation, mood changes, confusion, nervousness, sleep disturbance and agitation    Physical Exam: Vitals:   03/14/21 1340  BP: 120/80  Pulse: 81  Temp: 98.2 F (36.8 C)  TempSrc: Oral  SpO2: 96%   Weight: 291 lb 9.6 oz (132.3 kg)    Body mass index is 38.47 kg/m.   Constitutional: NAD, calm, comfortable Eyes: PERRL, lids and conjunctivae normal ENMT: Mucous membranes are moist. Posterior pharynx clear of any exudate or lesions. Normal dentition. Tympanic membrane is pearly white, no erythema or bulging. Respiratory: clear to auscultation bilaterally, no wheezing, no crackles. Normal respiratory effort. No accessory muscle use.  Cardiovascular: Regular rate and rhythm, no murmurs / rubs / gallops. No extremity edema.  Psychiatric: Normal judgment and insight. Alert and oriented x 3. Normal mood.    Impression and Plan:  Viral upper respiratory tract infection  -Given exam findings, PNA, pharyngitis, ear infection are not likely, hence abx have not been prescribed. -Have advised rest, fluids, OTC antihistamines, cough suppressants and mucinex. -RTC if no improvement in 10-14 days.       Chaya Jan, MD [facility] at [facility]

## 2021-03-30 ENCOUNTER — Ambulatory Visit: Payer: 59 | Admitting: Orthopaedic Surgery

## 2021-04-03 ENCOUNTER — Ambulatory Visit (INDEPENDENT_AMBULATORY_CARE_PROVIDER_SITE_OTHER): Payer: 59 | Admitting: Orthopaedic Surgery

## 2021-04-03 DIAGNOSIS — G8929 Other chronic pain: Secondary | ICD-10-CM | POA: Diagnosis not present

## 2021-04-03 DIAGNOSIS — M25512 Pain in left shoulder: Secondary | ICD-10-CM | POA: Diagnosis not present

## 2021-04-03 DIAGNOSIS — M7542 Impingement syndrome of left shoulder: Secondary | ICD-10-CM

## 2021-04-03 NOTE — Progress Notes (Signed)
The patient comes in today feeling much better with his left shoulder impingement syndrome.  In July I did place a steroid injection in the subacromial outlet and he said this feels much better than his first injection.  He says he is 180 degrees from it was before.  He does not feel like he needs surgery at the moment.  He still shows some signs of impingement on his left shoulder exam but is moving well and his pain seems to be not as bad as what it was when I saw him a month ago.  At this point we will continue to follow his conservative.  If he is bothered enough and October we can always perform an injection then if need be.  All question concerns were answered and addressed.  Follow-up is as needed.

## 2021-04-10 ENCOUNTER — Other Ambulatory Visit: Payer: Self-pay

## 2021-04-10 ENCOUNTER — Other Ambulatory Visit (INDEPENDENT_AMBULATORY_CARE_PROVIDER_SITE_OTHER): Payer: 59

## 2021-04-10 DIAGNOSIS — E559 Vitamin D deficiency, unspecified: Secondary | ICD-10-CM | POA: Diagnosis not present

## 2021-04-10 DIAGNOSIS — E038 Other specified hypothyroidism: Secondary | ICD-10-CM | POA: Diagnosis not present

## 2021-04-10 LAB — TSH: TSH: 5.93 u[IU]/mL — ABNORMAL HIGH (ref 0.35–5.50)

## 2021-04-10 LAB — VITAMIN D 25 HYDROXY (VIT D DEFICIENCY, FRACTURES): VITD: 31.02 ng/mL (ref 30.00–100.00)

## 2021-04-11 ENCOUNTER — Other Ambulatory Visit: Payer: Self-pay | Admitting: Internal Medicine

## 2021-04-11 DIAGNOSIS — E559 Vitamin D deficiency, unspecified: Secondary | ICD-10-CM

## 2021-04-11 DIAGNOSIS — E038 Other specified hypothyroidism: Secondary | ICD-10-CM

## 2021-04-11 MED ORDER — VITAMIN D (ERGOCALCIFEROL) 1.25 MG (50000 UNIT) PO CAPS: 50000.0000 [IU] | ORAL_CAPSULE | ORAL | 0 refills | Status: AC

## 2021-04-11 MED ORDER — LEVOTHYROXINE SODIUM 137 MCG PO TABS: 137.0000 ug | ORAL_TABLET | Freq: Every day | ORAL | 1 refills | Status: DC

## 2021-04-11 NOTE — Addendum Note (Signed)
Addended by: Johnella Moloney on: 04/11/2021 02:35 PM   Modules accepted: Orders

## 2021-04-12 ENCOUNTER — Other Ambulatory Visit: Payer: Self-pay

## 2021-04-12 ENCOUNTER — Ambulatory Visit (INDEPENDENT_AMBULATORY_CARE_PROVIDER_SITE_OTHER): Payer: 59 | Admitting: Internal Medicine

## 2021-04-12 ENCOUNTER — Encounter: Payer: Self-pay | Admitting: Internal Medicine

## 2021-04-12 VITALS — BP 110/80 | HR 75 | Temp 98.1°F | Wt 292.7 lb

## 2021-04-12 DIAGNOSIS — L0293 Carbuncle, unspecified: Secondary | ICD-10-CM

## 2021-04-12 DIAGNOSIS — E038 Other specified hypothyroidism: Secondary | ICD-10-CM

## 2021-04-12 MED ORDER — DOXYCYCLINE HYCLATE 100 MG PO TABS: 100.0000 mg | ORAL_TABLET | Freq: Two times a day (BID) | ORAL | 0 refills | Status: AC

## 2021-04-12 MED ORDER — MUPIROCIN CALCIUM 2 % EX CREA: 1.0000 "application " | TOPICAL_CREAM | Freq: Two times a day (BID) | CUTANEOUS | 0 refills | Status: DC

## 2021-04-12 NOTE — Patient Instructions (Signed)
-  Nice seeing you today!!  -Start doxycycline 100 mg twice daily for 7 days.  -Start bactroban cream intranasally twice daily for 6 months.

## 2021-04-12 NOTE — Progress Notes (Signed)
Established Patient Office Visit     This visit occurred during the SARS-CoV-2 public health emergency.  Safety protocols were in place, including screening questions prior to the visit, additional usage of staff PPE, and extensive cleaning of exam room while observing appropriate contact time as indicated for disinfecting solutions.    CC/Reason for Visit: Recurrent boils  HPI: Carlos Wheeler is a 40 y.o. male who is coming in today for the above mentioned reasons.  He has been seen in office several times for recurrent boils in different areas of his body.  At this time they are present in both of his axillas.  He has been treated successfully with doxycycline in the past.  A previous culture did show community-acquired MRSA.  He was called yesterday to let him know that his levothyroxine dose had increased from 125 to 137 mcg daily due to an under suppressed TSH.  Past Medical/Surgical History: Past Medical History:  Diagnosis Date   Anxiety    Asthma    GERD (gastroesophageal reflux disease)    Sleep apnea     Past Surgical History:  Procedure Laterality Date   NO PAST SURGERIES      Social History:  reports that he has never smoked. He has never used smokeless tobacco. He reports that he does not currently use alcohol after a past usage of about 2.0 standard drinks per week. He reports that he does not use drugs.  Allergies: Allergies  Allergen Reactions   Amoxicillin Nausea And Vomiting   Montelukast Other (See Comments)    Causes forgetfulness     Family History:  Family History  Problem Relation Age of Onset   Hyperlipidemia Mother    Hypertension Mother    Heart attack Father 4   Heart disease Father    Hypertension Father    Diabetes Sister    Hyperlipidemia Sister    Hypertension Sister    Diabetes Maternal Grandmother    Hypertension Maternal Grandmother    Diabetes Maternal Grandfather    Hypertension Maternal Grandfather    Heart  attack Maternal Grandfather 109   Diabetes Paternal Grandmother    Stroke Paternal Grandmother 57   Diabetes Paternal Grandfather    Cancer Neg Hx      Current Outpatient Medications:    albuterol (VENTOLIN HFA) 108 (90 Base) MCG/ACT inhaler, Inhale 1 puff into the lungs every 6 (six) hours as needed for wheezing or shortness of breath., Disp: 18 g, Rfl: 2   cyanocobalamin (,VITAMIN B-12,) 1000 MCG/ML injection, INJECT 1 ML IN THE DELTOID ONCE A WEEK FOR A MONTH. THEN INJECT 1 ML ONCE A MONTH THEREAFTER., Disp: 6 mL, Rfl: 1   doxycycline (VIBRA-TABS) 100 MG tablet, Take 1 tablet (100 mg total) by mouth 2 (two) times daily for 7 days., Disp: 14 tablet, Rfl: 0   levothyroxine (SYNTHROID) 137 MCG tablet, Take 1 tablet (137 mcg total) by mouth daily., Disp: 90 tablet, Rfl: 1   mupirocin cream (BACTROBAN) 2 %, Apply 1 application topically 2 (two) times daily., Disp: 30 g, Rfl: 0   SYRINGE-NEEDLE, DISP, 3 ML (B-D INTEGRA SYRINGE) 23G X 1" 3 ML MISC, Use for b12 injections, Disp: 100 each, Rfl: 3   Vitamin D, Ergocalciferol, (DRISDOL) 1.25 MG (50000 UNIT) CAPS capsule, Take 1 capsule (50,000 Units total) by mouth every 7 (seven) days for 12 doses., Disp: 12 capsule, Rfl: 0  Review of Systems:  Constitutional: Denies fever, chills, diaphoresis, appetite change and fatigue.  HEENT: Denies photophobia, eye pain, redness, hearing loss, ear pain, congestion, sore throat, rhinorrhea, sneezing, mouth sores, trouble swallowing, neck pain, neck stiffness and tinnitus.   Respiratory: Denies SOB, DOE, cough, chest tightness,  and wheezing.   Cardiovascular: Denies chest pain, palpitations and leg swelling.  Gastrointestinal: Denies nausea, vomiting, abdominal pain, diarrhea, constipation, blood in stool and abdominal distention.  Genitourinary: Denies dysuria, urgency, frequency, hematuria, flank pain and difficulty urinating.  Endocrine: Denies: hot or cold intolerance, sweats, changes in hair or nails,  polyuria, polydipsia. Musculoskeletal: Denies myalgias, back pain, joint swelling, arthralgias and gait problem.  Skin: Denies pallor, rash and wound.  Neurological: Denies dizziness, seizures, syncope, weakness, light-headedness, numbness and headaches.  Hematological: Denies adenopathy. Easy bruising, personal or family bleeding history  Psychiatric/Behavioral: Denies suicidal ideation, mood changes, confusion, nervousness, sleep disturbance and agitation    Physical Exam: Vitals:   04/12/21 0801  BP: 110/80  Pulse: 75  Temp: 98.1 F (36.7 C)  TempSrc: Oral  SpO2: 97%  Weight: 292 lb 11.2 oz (132.8 kg)    Body mass index is 38.62 kg/m.   Constitutional: NAD, calm, comfortable Eyes: PERRL, lids and conjunctivae normal ENMT: Mucous membranes are moist.  Skin: Boils are present in both of his axilla, not draining spontaneously. Neurologic: Grossly intact and nonfocal Psychiatric: Normal judgment and insight. Alert and oriented x 3. Normal mood.    Impression and Plan:  Recurrent boils  - Plan: doxycycline (VIBRA-TABS) 100 MG tablet, mupirocin cream (BACTROBAN) 2 % -Treat current boils with doxycycline, however I believe it is time that we do a decolonization with Bactroban twice daily intranasally for 6 months.  Patient agrees.  Other specified hypothyroidism -He is aware of levothyroxine dose increase.  Time spent: 31 minutes reviewing chart, interviewing and examining patient and formulating plan of care.   Patient Instructions  -Nice seeing you today!!  -Start doxycycline 100 mg twice daily for 7 days.  -Start bactroban cream intranasally twice daily for 6 months.   Chaya Jan, MD [facility] at [facility]

## 2021-04-22 ENCOUNTER — Encounter: Payer: Self-pay | Admitting: Emergency Medicine

## 2021-04-22 ENCOUNTER — Other Ambulatory Visit: Payer: Self-pay

## 2021-04-22 ENCOUNTER — Ambulatory Visit
Admission: EM | Admit: 2021-04-22 | Discharge: 2021-04-22 | Disposition: A | Discharge status: Home / Self Care | Payer: Managed Care, Other (non HMO) | Attending: Emergency Medicine | Admitting: Emergency Medicine

## 2021-04-22 DIAGNOSIS — Z23 Encounter for immunization: Secondary | ICD-10-CM

## 2021-04-22 DIAGNOSIS — S61411A Laceration without foreign body of right hand, initial encounter: Secondary | ICD-10-CM

## 2021-04-22 MED ORDER — CEPHALEXIN 500 MG PO CAPS: 500.0000 mg | ORAL_CAPSULE | Freq: Four times a day (QID) | ORAL | 0 refills | Status: DC

## 2021-04-22 MED ORDER — TETANUS-DIPHTH-ACELL PERTUSSIS 5-2.5-18.5 LF-MCG/0.5 IM SUSY
0.5000 mL | PREFILLED_SYRINGE | Freq: Once | INTRAMUSCULAR | Status: AC
  Administered 2021-04-22: 0.5 mL via INTRAMUSCULAR

## 2021-04-22 NOTE — ED Triage Notes (Signed)
Pt here with laceration on right palm under thumb occurring this morning with a high speed drill. Tetanus was last updated 6 years ago.

## 2021-04-22 NOTE — Discharge Instructions (Addendum)
Your tetanus was updated today.    Take the cephalexin as directed.  Keep your wound clean and dry.  Wash it gently with soap and water twice a day.  See the attached information on laceration care.    Your stitches need to come out in 7 to 10 days.  You can return here for this.    Follow-up with your primary care provider or return here if you note signs of infection such as pus-like drainage, fever, redness, or other concerns.

## 2021-04-22 NOTE — ED Provider Notes (Signed)
Carlos Wheeler    CSN: [id number] Arrival date & time: 04/22/21  6578      History   Chief Complaint Chief Complaint  Patient presents with   Laceration    HPI Carlos Wheeler is a 40 y.o. male.  Patient presents with a laceration on his right palm which occurred this morning when he was using a high-speed drill at home.  Bleeding controlled with direct pressure.  He denies numbness, weakness, or other symptoms.  Last tetanus approximately 6 years ago.  His medical history includes asthma, GERD, anxiety, sleep apnea.  The history is provided by the patient and medical records.   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   Acute shoulder bursitis, left 09/29/2020   Vitamin D deficiency 09/09/2020   Vitamin B12 deficiency 09/09/2020   Hyperlipidemia 08/25/2019   Hypothyroidism 08/25/2019   IGT (impaired glucose tolerance) 08/25/2019   CKD (chronic kidney disease) stage 3, GFR 30-59 ml/min (HCC) 06/09/2019   Elevated LFTs 06/09/2019   Hand numbness 06/09/2019   Acute kidney failure, unspecified (HCC) 06/09/2019   Loss of taste 05/11/2019   History of heavy alcohol consumption 05/10/2019   Low back pain 04/24/2019   Elevated random blood glucose level 02/05/2019   Anxiety 09/30/2018   Obesity (BMI 30.0-34.9) 12/04/2016   GAD (generalized anxiety disorder) 12/04/2016   Obstructive sleep apnea of adult 08/02/2016   Gastroesophageal reflux disease 05/29/2016    Past Surgical History:  Procedure Laterality Date   NO PAST SURGERIES         Home Medications    Prior to Admission medications   Medication Sig Start Date End Date Taking? Authorizing Provider  cephALEXin (KEFLEX) 500 MG capsule Take 1 capsule (500 mg total) by mouth 4 (four) times daily. 04/22/21  Yes Mickie Bail, NP   albuterol (VENTOLIN HFA) 108 (90 Base) MCG/ACT inhaler Inhale 1 puff into the lungs every 6 (six) hours as needed for wheezing or shortness of breath. 03/10/21   Philip Aspen, Limmie Patricia, MD  cyanocobalamin (,VITAMIN B-12,) 1000 MCG/ML injection INJECT 1 ML IN THE DELTOID ONCE A WEEK FOR A MONTH. THEN INJECT 1 ML ONCE A MONTH THEREAFTER. 02/10/21   Philip Aspen, Limmie Patricia, MD  levothyroxine (SYNTHROID) 137 MCG tablet Take 1 tablet (137 mcg total) by mouth daily. 04/11/21   Philip Aspen, Limmie Patricia, MD  mupirocin cream (BACTROBAN) 2 % Apply 1 application topically 2 (two) times daily. 04/12/21   Philip Aspen, Limmie Patricia, MD  SYRINGE-NEEDLE, DISP, 3 ML (B-D INTEGRA SYRINGE) 23G X 1" 3 ML MISC Use for b12 injections 09/13/20   Philip Aspen, Limmie Patricia, MD  Vitamin D, Ergocalciferol, (DRISDOL) 1.25 MG (50000 UNIT) CAPS capsule Take 1 capsule (50,000 Units total) by mouth every 7 (seven) days for 12 doses. 04/11/21 06/28/21  Henderson Cloud, MD    Family History Family History  Problem Relation Age of Onset   Hyperlipidemia Mother    Hypertension Mother    Heart attack Father 46   Heart disease Father    Hypertension Father    Diabetes Sister    Hyperlipidemia Sister    Hypertension Sister    Diabetes Maternal Grandmother    Hypertension Maternal Grandmother    Diabetes Maternal Grandfather  Hypertension Maternal Grandfather    Heart attack Maternal Grandfather 52   Diabetes Paternal Grandmother    Stroke Paternal Grandmother 55   Diabetes Paternal Grandfather    Cancer Neg Hx     Social History Social History   Tobacco Use   Smoking status: Never   Smokeless tobacco: Never  Vaping Use   Vaping Use: Never used  Substance Use Topics   Alcohol use: Not Currently    Alcohol/week: 2.0 standard drinks    Types: 2 Glasses of wine per week    Comment: quit drinking heavily 2 months ago   Drug use: No     Allergies   Amoxicillin and Montelukast   Review of  Systems Review of Systems  Constitutional:  Negative for chills and fever.  Respiratory:  Negative for cough and shortness of breath.   Cardiovascular:  Negative for chest pain and palpitations.  Skin:  Positive for wound. Negative for color change.  Neurological:  Negative for weakness and numbness.  All other systems reviewed and are negative.   Physical Exam Triage Vital Signs ED Triage Vitals  Enc Vitals Group     BP      Pulse      Resp      Temp      Temp src      SpO2      Weight      Height      Head Circumference      Peak Flow      Pain Score      Pain Loc      Pain Edu?      Excl. in GC?    No data found.  Updated Vital Signs BP 126/80   Pulse 81   Temp 98.2 F (36.8 C) (Oral)   Resp 20   SpO2 97%   Visual Acuity Right Eye Distance:   Left Eye Distance:   Bilateral Distance:    Right Eye Near:   Left Eye Near:    Bilateral Near:     Physical Exam Vitals and nursing note reviewed.  Constitutional:      General: He is not in acute distress.    Appearance: He is well-developed.  HENT:     Head: Normocephalic and atraumatic.     Mouth/Throat:     Mouth: Mucous membranes are moist.  Eyes:     Conjunctiva/sclera: Conjunctivae normal.  Cardiovascular:     Rate and Rhythm: Normal rate and regular rhythm.     Heart sounds: Normal heart sounds.  Pulmonary:     Effort: Pulmonary effort is normal. No respiratory distress.     Breath sounds: Normal breath sounds.  Abdominal:     Palpations: Abdomen is soft.     Tenderness: There is no abdominal tenderness.  Musculoskeletal:        General: No deformity. Normal range of motion.     Cervical back: Neck supple.  Skin:    General: Skin is warm and dry.     Capillary Refill: Capillary refill takes less than 2 seconds.     Findings: Lesion present.     Comments: 4 cm laceration on right palm.  No active bleeding.  Neurological:     General: No focal deficit present.     Mental Status: He is alert  and oriented to person, place, and time.     Sensory: No sensory deficit.     Motor: No weakness.     Gait: Gait normal.  Psychiatric:        Mood and Affect: Mood normal.        Behavior: Behavior normal.     UC Treatments / Results  Labs (all labs ordered are listed, but only abnormal results are displayed) Labs Reviewed - No data to display  EKG   Radiology No results found.  Procedures Laceration Repair  Date/Time: 04/22/2021 10:40 AM Performed by: Mickie Bailate, Tallin Hart H, NP Authorized by: Mickie Bailate, Ryne Mctigue H, NP   Consent:    Consent obtained:  Verbal   Consent given by:  Patient   Risks discussed:  Infection, pain, poor cosmetic result and poor wound healing Universal protocol:    Procedure explained and questions answered to patient or proxy's satisfaction: yes   Anesthesia:    Anesthesia method:  Local infiltration   Local anesthetic:  Lidocaine 1% w/o epi Laceration details:    Location:  Hand   Hand location:  R palm   Length (cm):  4   Depth (mm):  2 Pre-procedure details:    Preparation:  Patient was prepped and draped in usual sterile fashion Exploration:    Hemostasis achieved with:  Direct pressure   Wound exploration: wound explored through full range of motion and entire depth of wound visualized   Treatment:    Area cleansed with:  Povidone-iodine   Amount of cleaning:  Standard   Irrigation solution:  Sterile saline   Irrigation method:  Syringe   Visualized foreign bodies/material removed: no   Skin repair:    Repair method:  Sutures   Suture size:  3-0   Suture material:  Nylon   Suture technique:  Simple interrupted   Number of sutures:  5 Approximation:    Approximation:  Close Repair type:    Repair type:  Simple Post-procedure details:    Dressing:  Antibiotic ointment and non-adherent dressing   Procedure completion:  Tolerated well, no immediate complications (including critical care time)  Medications Ordered in UC Medications  Tdap  (BOOSTRIX) injection 0.5 mL (0.5 mLs Intramuscular Given 04/22/21 1018)    Initial Impression / Assessment and Plan / UC Course  I have reviewed the triage vital signs and the nursing notes.  Pertinent labs & imaging results that were available during my care of the patient were reviewed by me and considered in my medical decision making (see chart for details).  Laceration of right palm.  5 sutures.  Tetanus updated today.  Wound care instructions and signs of infection discussed with patient.  Due to the mechanism of injury, treating with Keflex.  Instructed patient to follow-up with his PCP or return here right away if he notes signs of infection.  Discussed that his sutures need to be removed in 7 to 10 days and that he can return here for this.  Patient agrees to plan of care.   Final Clinical Impressions(s) / UC Diagnoses   Final diagnoses:  Laceration of right hand without foreign body, initial encounter     Discharge Instructions      Your tetanus was updated today.    Take the cephalexin as directed.  Keep your wound clean and dry.  Wash it gently with soap and water twice a day.  See the attached information on laceration care.    Your stitches need to come out in 7 to 10 days.  You can return here for this.    Follow-up with your primary care provider or return here if you note signs of infection such  as pus-like drainage, fever, redness, or other concerns.         ED Prescriptions     Medication Sig Dispense Auth. Provider   cephALEXin (KEFLEX) 500 MG capsule Take 1 capsule (500 mg total) by mouth 4 (four) times daily. 28 capsule Mickie Bail, NP      PDMP not reviewed this encounter.   Mickie Bail, NP 04/22/21 1046

## 2021-04-26 ENCOUNTER — Telehealth: Payer: Self-pay | Admitting: *Deleted

## 2021-04-26 NOTE — Telephone Encounter (Signed)
Prior Auth started for  Mupirocin Calcium 2% Key: [id number]

## 2021-04-29 ENCOUNTER — Other Ambulatory Visit: Payer: Self-pay

## 2021-04-29 ENCOUNTER — Ambulatory Visit
Admission: EM | Admit: 2021-04-29 | Discharge: 2021-04-29 | Disposition: A | Discharge status: Home / Self Care | Payer: Managed Care, Other (non HMO)

## 2021-04-29 NOTE — ED Triage Notes (Signed)
Patient presents to urgent care for suture removal. No signs or symptoms of infection present. No concerns voiced by patient. 5 sutures were placed on 09/10. Instructed patient to return in about 2 days for reassessment of hand. At this visit sutures were not removed. Instructed patient to return before then if any concerns or signs/symptoms of an infection. Pt voiced understanding.

## 2021-05-02 ENCOUNTER — Other Ambulatory Visit: Payer: Self-pay

## 2021-05-02 ENCOUNTER — Encounter: Payer: Self-pay | Admitting: Emergency Medicine

## 2021-05-02 ENCOUNTER — Ambulatory Visit
Admission: EM | Admit: 2021-05-02 | Discharge: 2021-05-02 | Disposition: A | Discharge status: Home / Self Care | Payer: Managed Care, Other (non HMO)

## 2021-05-02 DIAGNOSIS — Z4802 Encounter for removal of sutures: Secondary | ICD-10-CM

## 2021-05-02 NOTE — ED Triage Notes (Signed)
Patient presents to the clinic for suture removal.

## 2021-05-25 ENCOUNTER — Other Ambulatory Visit (INDEPENDENT_AMBULATORY_CARE_PROVIDER_SITE_OTHER): Payer: Managed Care, Other (non HMO)

## 2021-05-25 ENCOUNTER — Other Ambulatory Visit: Payer: Self-pay

## 2021-05-25 DIAGNOSIS — E038 Other specified hypothyroidism: Secondary | ICD-10-CM

## 2021-05-25 LAB — TSH: TSH: 4.59 u[IU]/mL (ref 0.35–5.50)

## 2021-06-15 ENCOUNTER — Telehealth: Payer: Self-pay | Admitting: Internal Medicine

## 2021-06-15 DIAGNOSIS — E038 Other specified hypothyroidism: Secondary | ICD-10-CM

## 2021-06-15 NOTE — Telephone Encounter (Signed)
Pt is calling and have joint pain . Pt states normally when he has joint pain his TSH is off and would like to come in to have TSH recheck

## 2021-06-16 ENCOUNTER — Other Ambulatory Visit: Payer: Self-pay

## 2021-06-16 ENCOUNTER — Other Ambulatory Visit (INDEPENDENT_AMBULATORY_CARE_PROVIDER_SITE_OTHER): Payer: Managed Care, Other (non HMO)

## 2021-06-16 DIAGNOSIS — E038 Other specified hypothyroidism: Secondary | ICD-10-CM

## 2021-06-16 NOTE — Addendum Note (Signed)
Addended by: Marian Sorrow D on: 06/16/2021 03:18 PM   Modules accepted: Orders

## 2021-06-16 NOTE — Telephone Encounter (Signed)
Lab appointment and order placed 

## 2021-06-17 LAB — TSH: TSH: 5.57 mIU/L — ABNORMAL HIGH (ref 0.40–4.50)

## 2021-06-21 ENCOUNTER — Encounter: Payer: Self-pay | Admitting: Internal Medicine

## 2021-06-21 DIAGNOSIS — E038 Other specified hypothyroidism: Secondary | ICD-10-CM

## 2021-06-22 ENCOUNTER — Telehealth: Payer: Self-pay | Admitting: Internal Medicine

## 2021-06-22 MED ORDER — LEVOTHYROXINE SODIUM 150 MCG PO TABS: 150.0000 ug | ORAL_TABLET | Freq: Every day | ORAL | 1 refills | Status: DC

## 2021-06-22 NOTE — Telephone Encounter (Signed)
Addressed in MyChart messages

## 2021-06-22 NOTE — Addendum Note (Signed)
Addended by: Kern Reap B on: 06/22/2021 09:30 AM   Modules accepted: Orders

## 2021-06-22 NOTE — Telephone Encounter (Signed)
Pt has send mychart message and has ?about his tsh and also when should he have his vit d and b12 recheck

## 2021-07-27 ENCOUNTER — Encounter: Payer: Self-pay | Admitting: Internal Medicine

## 2021-07-27 DIAGNOSIS — L0293 Carbuncle, unspecified: Secondary | ICD-10-CM

## 2021-07-27 NOTE — Telephone Encounter (Signed)
Patient called to follow up on getting antibiotics, I let patient know that Dr.Hernandez has seen the message an they were working on it.      Please send to   [id number], [place] - [address] Phone:  [phone]  Fax:  [phone]        Please advise

## 2021-07-28 MED ORDER — MUPIROCIN CALCIUM 2 % EX CREA: 1.0000 "application " | TOPICAL_CREAM | Freq: Two times a day (BID) | CUTANEOUS | 0 refills | Status: DC

## 2021-07-31 ENCOUNTER — Other Ambulatory Visit: Payer: Self-pay | Admitting: Internal Medicine

## 2021-08-02 ENCOUNTER — Other Ambulatory Visit (INDEPENDENT_AMBULATORY_CARE_PROVIDER_SITE_OTHER): Payer: Self-pay

## 2021-08-02 ENCOUNTER — Other Ambulatory Visit: Payer: Self-pay | Admitting: Internal Medicine

## 2021-08-02 DIAGNOSIS — E038 Other specified hypothyroidism: Secondary | ICD-10-CM | POA: Diagnosis not present

## 2021-08-02 LAB — TSH: TSH: 5.76 u[IU]/mL — ABNORMAL HIGH (ref 0.35–5.50)

## 2021-08-02 MED ORDER — LEVOTHYROXINE SODIUM 175 MCG PO TABS: 175.0000 ug | ORAL_TABLET | Freq: Every day | ORAL | 1 refills | Status: DC

## 2021-08-03 ENCOUNTER — Other Ambulatory Visit: Payer: Self-pay | Admitting: Internal Medicine

## 2021-08-03 DIAGNOSIS — E039 Hypothyroidism, unspecified: Secondary | ICD-10-CM

## 2021-08-11 ENCOUNTER — Telehealth: Payer: Self-pay

## 2021-08-11 NOTE — Telephone Encounter (Signed)
---  [name] states his synthroid rx was increased a week ago. Was 150 mcg, increased to 175 mcg. Since then he's actually feeling better, but he also has developed heart palpations.  08/11/2021 11:06:22 AM SEE PCP WITHIN 3 DAYS Carylon Perches RN, Hilda Lias  08/11/21 1535: Pt states he noticed heart palpitations x 1wk & only at night. Pt advised to modify diet (decrease caffeine, soda, & chocolate; decrease anxiety). Pt states he would like to try those things 1st before seeing provider, but also wanted to schedule an appt. Pt denies light headedness, chest pain, or vertigo when palpitations noted.

## 2021-08-16 ENCOUNTER — Encounter: Payer: Self-pay | Admitting: Internal Medicine

## 2021-08-16 ENCOUNTER — Ambulatory Visit (INDEPENDENT_AMBULATORY_CARE_PROVIDER_SITE_OTHER): Payer: BC Managed Care – PPO | Admitting: Internal Medicine

## 2021-08-16 VITALS — BP 132/76 | HR 78 | Temp 98.0°F | Ht 73.0 in | Wt 293.0 lb

## 2021-08-16 DIAGNOSIS — E038 Other specified hypothyroidism: Secondary | ICD-10-CM

## 2021-08-16 DIAGNOSIS — R002 Palpitations: Secondary | ICD-10-CM

## 2021-08-16 DIAGNOSIS — R053 Chronic cough: Secondary | ICD-10-CM

## 2021-08-16 MED ORDER — HYDROCOD POLST-CPM POLST ER 10-8 MG/5ML PO SUER: 5.0000 mL | Freq: Two times a day (BID) | ORAL | 0 refills | Status: DC | PRN

## 2021-08-16 NOTE — Progress Notes (Signed)
Acute office Visit     This visit occurred during the SARS-CoV-2 public health emergency.  Safety protocols were in place, including screening questions prior to the visit, additional usage of staff PPE, and extensive cleaning of exam room while observing appropriate contact time as indicated for disinfecting solutions.    CC/Reason for Visit: Palpitations  HPI: Carlos Wheeler is a 41 y.o. male who is coming in today for the above mentioned reasons. Past Medical History is significant for: Hypothyroidism.  About 3 weeks ago his levothyroxine dose was increased from 150 to 175 mcg daily due to an elevated TSH.  He has noticed increasing palpitations especially at nighttime.  Upon questioning he drinks a large cup of coffee in the morning, has also been eating a lot of chocolate and drinking 1 to 2 cans of diet soda a day.  He has tried to cut down on his caffeine intake and has noticed significant improvement in the palpitations.   Past Medical/Surgical History: Past Medical History:  Diagnosis Date   Anxiety    Asthma    GERD (gastroesophageal reflux disease)    Sleep apnea     Past Surgical History:  Procedure Laterality Date   NO PAST SURGERIES      Social History:  reports that he has never smoked. He has never used smokeless tobacco. He reports that he does not currently use alcohol after a past usage of about 2.0 standard drinks per week. He reports that he does not use drugs.  Allergies: Allergies  Allergen Reactions   Amoxicillin Nausea And Vomiting   Montelukast Other (See Comments)    Causes forgetfulness     Family History:  Family History  Problem Relation Age of Onset   Hyperlipidemia Mother    Hypertension Mother    Heart attack Father 69   Heart disease Father    Hypertension Father    Diabetes Sister    Hyperlipidemia Sister    Hypertension Sister    Diabetes Maternal Grandmother    Hypertension Maternal Grandmother    Diabetes Maternal  Grandfather    Hypertension Maternal Grandfather    Heart attack Maternal Grandfather 62   Diabetes Paternal Grandmother    Stroke Paternal Grandmother 29   Diabetes Paternal Grandfather    Cancer Neg Hx      Current Outpatient Medications:    albuterol (VENTOLIN HFA) 108 (90 Base) MCG/ACT inhaler, Inhale 1 puff into the lungs every 6 (six) hours as needed for wheezing or shortness of breath., Disp: 18 g, Rfl: 2   cephALEXin (KEFLEX) 500 MG capsule, Take 1 capsule (500 mg total) by mouth 4 (four) times daily., Disp: 28 capsule, Rfl: 0   chlorpheniramine-HYDROcodone (TUSSIONEX PENNKINETIC ER) 10-8 MG/5ML SUER, Take 5 mLs by mouth every 12 (twelve) hours as needed for cough., Disp: 70 mL, Rfl: 0   cyanocobalamin (,VITAMIN B-12,) 1000 MCG/ML injection, INJECT 1 ML IN THE DELTOID ONCE A WEEK FOR A MONTH. THEN INJECT 1 ML ONCE A MONTH THEREAFTER., Disp: 6 mL, Rfl: 1   levothyroxine (SYNTHROID) 175 MCG tablet, Take 1 tablet (175 mcg total) by mouth daily., Disp: 90 tablet, Rfl: 1   mupirocin cream (BACTROBAN) 2 %, Apply 1 application topically 2 (two) times daily., Disp: 30 g, Rfl: 0   SYRINGE-NEEDLE, DISP, 3 ML (B-D INTEGRA SYRINGE) 23G X 1" 3 ML MISC, Use for b12 injections, Disp: 100 each, Rfl: 3  Review of Systems:  Constitutional: Denies fever, chills, diaphoresis, appetite change  and fatigue.  HEENT: Denies photophobia, eye pain, redness, hearing loss, ear pain, congestion, sore throat, rhinorrhea, sneezing, mouth sores, trouble swallowing, neck pain, neck stiffness and tinnitus.   Respiratory: Denies SOB, DOE, cough, chest tightness,  and wheezing.   Cardiovascular: Denies chest pain and leg swelling.  Gastrointestinal: Denies nausea, vomiting, abdominal pain, diarrhea, constipation, blood in stool and abdominal distention.  Genitourinary: Denies dysuria, urgency, frequency, hematuria, flank pain and difficulty urinating.  Endocrine: Denies: hot or cold intolerance, sweats, changes in hair  or nails, polyuria, polydipsia. Musculoskeletal: Denies myalgias, back pain, joint swelling, arthralgias and gait problem.  Skin: Denies pallor, rash and wound.  Neurological: Denies dizziness, seizures, syncope, weakness, light-headedness, numbness and headaches.  Hematological: Denies adenopathy. Easy bruising, personal or family bleeding history  Psychiatric/Behavioral: Denies suicidal ideation, mood changes, confusion, nervousness, sleep disturbance and agitation    Physical Exam: Vitals:   08/16/21 1322  BP: 132/76  Pulse: 78  Temp: 98 F (36.7 C)  TempSrc: Oral  SpO2: 98%  Weight: 293 lb (132.9 kg)  Height: 6\' 1"  (1.854 m)    Body mass index is 38.66 kg/m.   Constitutional: NAD, calm, comfortable Eyes: PERRL, lids and conjunctivae normal ENMT: Mucous membranes are moist.  Respiratory: clear to auscultation bilaterally, no wheezing, no crackles. Normal respiratory effort. No accessory muscle use.  Cardiovascular: Regular rate and rhythm, no murmurs / rubs / gallops. No extremity edema.  Neurologic: Grossly intact and nonfocal Psychiatric: Normal judgment and insight. Alert and oriented x 3. Normal mood.    Impression and Plan:  Palpitations  - Plan: EKG 12-Lead -EKG has been done in office and interpreted by myself as sinus rhythm at a rate of around 70, normal axis, no acute ST or T wave changes. -Suspect related to caffeine as decreasing caffeine intake has significantly improved palpitations.  Other specified hypothyroidism -He is taking increase levothyroxine dose, repeat lab visit is already scheduled.  Time spent: 31 minutes reviewing chart, interviewing and examining patient and formulating plan of care.     Lelon Frohlich, MD [facility] at [facility]

## 2021-09-09 DIAGNOSIS — G4733 Obstructive sleep apnea (adult) (pediatric): Secondary | ICD-10-CM | POA: Diagnosis not present

## 2021-09-14 ENCOUNTER — Other Ambulatory Visit (INDEPENDENT_AMBULATORY_CARE_PROVIDER_SITE_OTHER): Payer: BC Managed Care – PPO

## 2021-09-14 DIAGNOSIS — E039 Hypothyroidism, unspecified: Secondary | ICD-10-CM | POA: Diagnosis not present

## 2021-09-14 LAB — TSH: TSH: 6.4 u[IU]/mL — ABNORMAL HIGH (ref 0.35–5.50)

## 2021-09-19 ENCOUNTER — Ambulatory Visit (INDEPENDENT_AMBULATORY_CARE_PROVIDER_SITE_OTHER): Payer: BC Managed Care – PPO | Admitting: Internal Medicine

## 2021-09-19 ENCOUNTER — Encounter: Payer: Self-pay | Admitting: Internal Medicine

## 2021-09-19 VITALS — BP 122/88 | HR 90 | Temp 99.2°F | Ht 73.0 in | Wt 295.7 lb

## 2021-09-19 DIAGNOSIS — R7302 Impaired glucose tolerance (oral): Secondary | ICD-10-CM

## 2021-09-19 DIAGNOSIS — Z114 Encounter for screening for human immunodeficiency virus [HIV]: Secondary | ICD-10-CM | POA: Diagnosis not present

## 2021-09-19 DIAGNOSIS — E785 Hyperlipidemia, unspecified: Secondary | ICD-10-CM | POA: Diagnosis not present

## 2021-09-19 DIAGNOSIS — E038 Other specified hypothyroidism: Secondary | ICD-10-CM

## 2021-09-19 DIAGNOSIS — Z0100 Encounter for examination of eyes and vision without abnormal findings: Secondary | ICD-10-CM

## 2021-09-19 DIAGNOSIS — E559 Vitamin D deficiency, unspecified: Secondary | ICD-10-CM | POA: Diagnosis not present

## 2021-09-19 DIAGNOSIS — E538 Deficiency of other specified B group vitamins: Secondary | ICD-10-CM

## 2021-09-19 DIAGNOSIS — Z0000 Encounter for general adult medical examination without abnormal findings: Secondary | ICD-10-CM

## 2021-09-19 LAB — COMPREHENSIVE METABOLIC PANEL
ALT: 30 U/L (ref 0–53)
AST: 33 U/L (ref 0–37)
Albumin: 4.7 g/dL (ref 3.5–5.2)
Alkaline Phosphatase: 60 U/L (ref 39–117)
BUN: 24 mg/dL — ABNORMAL HIGH (ref 6–23)
CO2: 30 mEq/L (ref 19–32)
Calcium: 9.6 mg/dL (ref 8.4–10.5)
Chloride: 99 mEq/L (ref 96–112)
Creatinine, Ser: 1.19 mg/dL (ref 0.40–1.50)
GFR: 77.43 mL/min (ref >60.00)
Glucose, Bld: 104 mg/dL — ABNORMAL HIGH (ref 70–99)
Potassium: 4.2 mEq/L (ref 3.5–5.1)
Sodium: 135 mEq/L (ref 135–145)
Total Bilirubin: 0.8 mg/dL (ref 0.2–1.2)
Total Protein: 7.6 g/dL (ref 6.0–8.3)

## 2021-09-19 LAB — CBC WITH DIFFERENTIAL/PLATELET
Basophils Absolute: 0 10*3/uL (ref 0.0–0.1)
Basophils Relative: 0.4 % (ref 0.0–3.0)
Eosinophils Absolute: 0.2 10*3/uL (ref 0.0–0.7)
Eosinophils Relative: 2.9 % (ref 0.0–5.0)
HCT: 41.2 % (ref 39.0–52.0)
Hemoglobin: 13.4 g/dL (ref 13.0–17.0)
Lymphocytes Relative: 7.1 % — ABNORMAL LOW (ref 12.0–46.0)
Lymphs Abs: 0.4 10*3/uL — ABNORMAL LOW (ref 0.7–4.0)
MCHC: 32.6 g/dL (ref 30.0–36.0)
MCV: 88.7 fl (ref 78.0–100.0)
Monocytes Absolute: 0.4 10*3/uL (ref 0.1–1.0)
Monocytes Relative: 7.9 % (ref 3.0–12.0)
Neutro Abs: 4.4 10*3/uL (ref 1.4–7.7)
Neutrophils Relative %: 81.7 % — ABNORMAL HIGH (ref 43.0–77.0)
Platelets: 157 10*3/uL (ref 150.0–400.0)
RBC: 4.64 Mil/uL (ref 4.22–5.81)
RDW: 14.2 % (ref 11.5–15.5)
WBC: 5.4 10*3/uL (ref 4.0–10.5)

## 2021-09-19 LAB — VITAMIN B12: Vitamin B-12: 790 pg/mL (ref 211–911)

## 2021-09-19 LAB — HEMOGLOBIN A1C: Hgb A1c MFr Bld: 6.2 % (ref 4.6–6.5)

## 2021-09-19 LAB — VITAMIN D 25 HYDROXY (VIT D DEFICIENCY, FRACTURES): VITD: 22.29 ng/mL — ABNORMAL LOW (ref 30.00–100.00)

## 2021-09-19 LAB — LIPID PANEL
Cholesterol: 168 mg/dL (ref 0–200)
HDL: 60.9 mg/dL (ref >39.00)
LDL Cholesterol: 81 mg/dL (ref 0–99)
NonHDL: 106.62
Total CHOL/HDL Ratio: 3
Triglycerides: 128 mg/dL (ref 0.0–149.0)
VLDL: 25.6 mg/dL (ref 0.0–40.0)

## 2021-09-19 NOTE — Progress Notes (Signed)
Established Patient Office Visit     This visit occurred during the SARS-CoV-2 public health emergency.  Safety protocols were in place, including screening questions prior to the visit, additional usage of staff PPE, and extensive cleaning of exam room while observing appropriate contact time as indicated for disinfecting solutions.    CC/Reason for Visit: Annual preventive exam  HPI: Carlos Wheeler is a 41 y.o. male who is coming in today for the above mentioned reasons. Past Medical History is significant for: Hypothyroidism, vitamin D and B12 deficiency.  He is feeling well today and has no acute concerns or complaints.  He has routine dental care but is overdue for eye care.  He is overdue for flu and COVID booster, he had a Tdap in 2022.  He does aerobic exercise for about 30 to 45 minutes every day.   Past Medical/Surgical History: Past Medical History:  Diagnosis Date   Anxiety    Asthma    GERD (gastroesophageal reflux disease)    Sleep apnea     Past Surgical History:  Procedure Laterality Date   NO PAST SURGERIES      Social History:  reports that he has never smoked. He has never used smokeless tobacco. He reports that he does not currently use alcohol after a past usage of about 2.0 standard drinks per week. He reports that he does not use drugs.  Allergies: Allergies  Allergen Reactions   Amoxicillin Nausea And Vomiting   Montelukast Other (See Comments)    Causes forgetfulness     Family History:  Family History  Problem Relation Age of Onset   Hyperlipidemia Mother    Hypertension Mother    Heart attack Father 28   Heart disease Father    Hypertension Father    Diabetes Sister    Hyperlipidemia Sister    Hypertension Sister    Diabetes Maternal Grandmother    Hypertension Maternal Grandmother    Diabetes Maternal Grandfather    Hypertension Maternal Grandfather    Heart attack Maternal Grandfather 48   Diabetes Paternal Grandmother     Stroke Paternal Grandmother 70   Diabetes Paternal Grandfather    Cancer Neg Hx      Current Outpatient Medications:    albuterol (VENTOLIN HFA) 108 (90 Base) MCG/ACT inhaler, Inhale 1 puff into the lungs every 6 (six) hours as needed for wheezing or shortness of breath., Disp: 18 g, Rfl: 2   cyanocobalamin (,VITAMIN B-12,) 1000 MCG/ML injection, INJECT 1 ML IN THE DELTOID ONCE A WEEK FOR A MONTH. THEN INJECT 1 ML ONCE A MONTH THEREAFTER., Disp: 6 mL, Rfl: 1   levothyroxine (SYNTHROID) 175 MCG tablet, Take 1 tablet (175 mcg total) by mouth daily., Disp: 90 tablet, Rfl: 1   mupirocin cream (BACTROBAN) 2 %, Apply 1 application topically 2 (two) times daily., Disp: 30 g, Rfl: 0   SYRINGE-NEEDLE, DISP, 3 ML (B-D INTEGRA SYRINGE) 23G X 1" 3 ML MISC, Use for b12 injections, Disp: 100 each, Rfl: 3  Review of Systems:  Constitutional: Denies fever, chills, diaphoresis, appetite change and fatigue.  HEENT: Denies photophobia, eye pain, redness, hearing loss, ear pain, congestion, sore throat, rhinorrhea, sneezing, mouth sores, trouble swallowing, neck pain, neck stiffness and tinnitus.   Respiratory: Denies SOB, DOE, cough, chest tightness,  and wheezing.   Cardiovascular: Denies chest pain, palpitations and leg swelling.  Gastrointestinal: Denies nausea, vomiting, abdominal pain, diarrhea, constipation, blood in stool and abdominal distention.  Genitourinary: Denies dysuria, urgency, frequency,  hematuria, flank pain and difficulty urinating.  Endocrine: Denies: hot or cold intolerance, sweats, changes in hair or nails, polyuria, polydipsia. Musculoskeletal: Denies myalgias, back pain, joint swelling, arthralgias and gait problem.  Skin: Denies pallor, rash and wound.  Neurological: Denies dizziness, seizures, syncope, weakness, light-headedness, numbness and headaches.  Hematological: Denies adenopathy. Easy bruising, personal or family bleeding history  Psychiatric/Behavioral: Denies suicidal  ideation, mood changes, confusion, nervousness, sleep disturbance and agitation    Physical Exam: Vitals:   09/19/21 0749  BP: 122/88  Pulse: 90  Temp: 99.2 F (37.3 C)  TempSrc: Oral  SpO2: 98%  Weight: 295 lb 11.2 oz (134.1 kg)  Height: 6\' 1"  (1.854 m)    Body mass index is 39.01 kg/m.   Constitutional: NAD, calm, comfortable Eyes: PERRL, lids and conjunctivae normal ENMT: Mucous membranes are moist. Posterior pharynx clear of any exudate or lesions. Normal dentition. Tympanic membrane is pearly white, no erythema or bulging. Neck: normal, supple, no masses, no thyromegaly Respiratory: clear to auscultation bilaterally, no wheezing, no crackles. Normal respiratory effort. No accessory muscle use.  Cardiovascular: Regular rate and rhythm, no murmurs / rubs / gallops. No extremity edema. 2+ pedal pulses. No carotid bruits.  Abdomen: no tenderness, no masses palpated. No hepatosplenomegaly. Bowel sounds positive.  Musculoskeletal: no clubbing / cyanosis. No joint deformity upper and lower extremities. Good ROM, no contractures. Normal muscle tone.  Skin: no rashes, lesions, ulcers. No induration Neurologic: CN 2-12 grossly intact. Sensation intact, DTR normal. Strength 5/5 in all 4.  Psychiatric: Normal judgment and insight. Alert and oriented x 3. Normal mood.    Impression and Plan:  Encounter for preventive health examination -Recommend routine eye and dental care. -Immunizations: He declines flu vaccine and COVID booster today despite counseling.  He will consider getting COVID booster at pharmacy -Healthy lifestyle discussed in detail. -Labs to be updated today. -Colon cancer screening: Commence age 51 -Breast cancer screening: Not applicable -Cervical cancer screening: Not applicable -Lung cancer screening: Not applicable -Prostate cancer screening: Commence age 64 -DEXA: Not applicable  Vitamin D deficiency  - Plan: VITAMIN D 25 Hydroxy (Vit-D Deficiency,  Fractures)  Vitamin B12 deficiency  - Plan: Vitamin B12  Hyperlipidemia, unspecified hyperlipidemia type  - Plan: CBC with Differential/Platelet, Comprehensive metabolic panel, Lipid panel -Last lipid panel in January 2022 with a total cholesterol of 218, triglycerides 142 and LDL 125.  He is not currently on statin therapy.  Other specified hypothyroidism -TSH was checked recently, he is on levothyroxine 175 mcg daily.  IGT (impaired glucose tolerance)  - Plan: Hemoglobin A1c -Last A1c was 6 in January 2022  Screening for HIV (human immunodeficiency virus)  - Plan: HIV antibody (with reflex)  Encounter for vision screening  - Plan: Ambulatory referral to Ophthalmology    Patient Instructions  -Nice seeing you today!!  -Lab work today; will notify you once results are available.  -Consider your flu vaccine and COVID booster.  -Schedule follow up in 6 months or sooner as needed.      Lelon Frohlich, MD [facility] at [facility]

## 2021-09-19 NOTE — Patient Instructions (Signed)
-  Nice seeing you today!!  -Lab work today; will notify you once results are available.  -Consider your flu vaccine and COVID booster.  -Schedule follow up in 6 months or sooner as needed.

## 2021-09-20 ENCOUNTER — Encounter: Payer: Self-pay | Admitting: Internal Medicine

## 2021-09-20 ENCOUNTER — Other Ambulatory Visit: Payer: Self-pay | Admitting: Internal Medicine

## 2021-09-20 DIAGNOSIS — E559 Vitamin D deficiency, unspecified: Secondary | ICD-10-CM

## 2021-09-20 LAB — HIV ANTIBODY (ROUTINE TESTING W REFLEX): HIV 1&2 Ab, 4th Generation: NONREACTIVE

## 2021-09-20 MED ORDER — VITAMIN D (ERGOCALCIFEROL) 1.25 MG (50000 UNIT) PO CAPS: 50000.0000 [IU] | ORAL_CAPSULE | ORAL | 0 refills | Status: AC

## 2021-09-21 ENCOUNTER — Other Ambulatory Visit: Payer: Self-pay | Admitting: Internal Medicine

## 2021-09-21 DIAGNOSIS — E559 Vitamin D deficiency, unspecified: Secondary | ICD-10-CM

## 2021-10-09 ENCOUNTER — Encounter: Payer: Self-pay | Admitting: Internal Medicine

## 2021-10-10 DIAGNOSIS — G4733 Obstructive sleep apnea (adult) (pediatric): Secondary | ICD-10-CM | POA: Diagnosis not present

## 2021-10-18 ENCOUNTER — Encounter: Payer: Self-pay | Admitting: Internal Medicine

## 2021-10-19 ENCOUNTER — Other Ambulatory Visit: Payer: Self-pay | Admitting: Internal Medicine

## 2021-10-19 DIAGNOSIS — E038 Other specified hypothyroidism: Secondary | ICD-10-CM

## 2021-10-25 ENCOUNTER — Other Ambulatory Visit (INDEPENDENT_AMBULATORY_CARE_PROVIDER_SITE_OTHER): Payer: BC Managed Care – PPO

## 2021-10-25 ENCOUNTER — Other Ambulatory Visit: Payer: Self-pay

## 2021-10-25 DIAGNOSIS — E038 Other specified hypothyroidism: Secondary | ICD-10-CM | POA: Diagnosis not present

## 2021-10-26 ENCOUNTER — Other Ambulatory Visit: Payer: BC Managed Care – PPO

## 2021-10-26 LAB — TSH: TSH: 3.93 u[IU]/mL (ref 0.35–5.50)

## 2021-11-21 ENCOUNTER — Encounter: Payer: Self-pay | Admitting: Family Medicine

## 2021-11-21 ENCOUNTER — Ambulatory Visit (INDEPENDENT_AMBULATORY_CARE_PROVIDER_SITE_OTHER): Payer: BC Managed Care – PPO | Admitting: Family Medicine

## 2021-11-21 ENCOUNTER — Telehealth: Payer: Self-pay | Admitting: Internal Medicine

## 2021-11-21 VITALS — BP 128/78 | HR 79 | Temp 98.8°F | Wt 295.0 lb

## 2021-11-21 DIAGNOSIS — L0293 Carbuncle, unspecified: Secondary | ICD-10-CM

## 2021-11-21 DIAGNOSIS — S29019A Strain of muscle and tendon of unspecified wall of thorax, initial encounter: Secondary | ICD-10-CM | POA: Diagnosis not present

## 2021-11-21 DIAGNOSIS — L0292 Furuncle, unspecified: Secondary | ICD-10-CM

## 2021-11-21 MED ORDER — CYCLOBENZAPRINE HCL 10 MG PO TABS: 10.0000 mg | ORAL_TABLET | Freq: Three times a day (TID) | ORAL | 0 refills | Status: DC | PRN

## 2021-11-21 MED ORDER — DOXYCYCLINE HYCLATE 100 MG PO CAPS: 100.0000 mg | ORAL_CAPSULE | Freq: Two times a day (BID) | ORAL | 0 refills | Status: AC

## 2021-11-21 MED ORDER — MUPIROCIN CALCIUM 2 % EX CREA: 1.0000 "application " | TOPICAL_CREAM | Freq: Two times a day (BID) | CUTANEOUS | 5 refills | Status: DC

## 2021-11-21 NOTE — Telephone Encounter (Signed)
Pt just seen Dr. Sarajane Jews in office today and stated that the Pharm sent over a Pre-Auth form for Dr. Sarajane Jews to fill out so the pt can get the med that was prescribed to him. Need it asap.  ? ?Please advise.  ?

## 2021-11-21 NOTE — Progress Notes (Signed)
? ?  Subjective:  ? ? Patient ID: Carlos Wheeler, male    DOB: 1980/09/19, 41 y.o.   MRN: [id number] ? ?HPI ?Here for 2 issues. First he has had another round of boils come up in the past week, with boils in the left axilla and on both anterior thighs. These are painful and they sometimes come to a head and drain. No fever. He had been using Mupiricin ointment in the nostrils to prevent these, but he ran out a few weeks ago. Also 3 days ago he pulled a muscle in his back while trying to move a heavy transmission. He has applied ice and taken Ibuprofen, with little relief.  ? ? ?Review of Systems  ?Constitutional: Negative.   ?Respiratory: Negative.    ?Cardiovascular: Negative.   ?Musculoskeletal:  Positive for back pain.  ?Skin:  Positive for rash.  ? ?   ?Objective:  ? Physical Exam ?Constitutional:   ?   Comments: In obvious pain in the back   ?Cardiovascular:  ?   Rate and Rhythm: Normal rate and regular rhythm.  ?   Pulses: Normal pulses.  ?   Heart sounds: Normal heart sounds.  ?Pulmonary:  ?   Effort: Pulmonary effort is normal.  ?   Breath sounds: Normal breath sounds.  ?Musculoskeletal:  ?   Comments: He is tender on the left side of the lower thoracic back area. There is a lot of spasm in these areas as well. ROM of the spine is full   ?Skin: ?   Comments: There are partly resolved boils in the above areas. These are warm, red, and tender.   ?Neurological:  ?   Mental Status: He is alert.  ? ? ? ? ? ?   ?Assessment & Plan:  ?The boils are likely due to a Staphylococcal infection, so we will treat these with 10 days of Doxycycline. We will refill the Mupiricin ointment for prophylaxis. He also has a thoracic strain, and we will treat this with Diclofenac BID and Flexeril TID as needed.  ?Gershon Crane, MD ? ? ?

## 2021-11-22 ENCOUNTER — Encounter: Payer: Self-pay | Admitting: Family Medicine

## 2021-11-23 NOTE — Telephone Encounter (Signed)
Key ?Key: [id number] ?

## 2021-11-23 NOTE — Telephone Encounter (Signed)
Had to call [facility] [employer] [phone]. Spoke with Westly Pam. ?Prior Auth was approved 10/24/21 - 11/23/22. ?

## 2021-11-23 NOTE — Telephone Encounter (Signed)
PA started ?Key Key: [id number] ?Patient is aware via MyChart ?

## 2021-12-08 DIAGNOSIS — G4733 Obstructive sleep apnea (adult) (pediatric): Secondary | ICD-10-CM | POA: Diagnosis not present

## 2022-01-02 ENCOUNTER — Encounter: Payer: Self-pay | Admitting: Internal Medicine

## 2022-01-02 DIAGNOSIS — E039 Hypothyroidism, unspecified: Secondary | ICD-10-CM

## 2022-01-03 ENCOUNTER — Other Ambulatory Visit (INDEPENDENT_AMBULATORY_CARE_PROVIDER_SITE_OTHER): Payer: BC Managed Care – PPO

## 2022-01-03 DIAGNOSIS — E039 Hypothyroidism, unspecified: Secondary | ICD-10-CM

## 2022-01-04 LAB — TSH: TSH: 4.56 u[IU]/mL (ref 0.35–5.50)

## 2022-01-07 DIAGNOSIS — G4733 Obstructive sleep apnea (adult) (pediatric): Secondary | ICD-10-CM | POA: Diagnosis not present

## 2022-01-19 ENCOUNTER — Encounter: Payer: Self-pay | Admitting: Internal Medicine

## 2022-01-19 DIAGNOSIS — E038 Other specified hypothyroidism: Secondary | ICD-10-CM

## 2022-01-19 MED ORDER — LEVOTHYROXINE SODIUM 175 MCG PO TABS: 175.0000 ug | ORAL_TABLET | Freq: Every day | ORAL | 1 refills | Status: DC

## 2022-02-05 ENCOUNTER — Ambulatory Visit: Payer: BC Managed Care – PPO | Admitting: Family Medicine

## 2022-02-05 NOTE — Progress Notes (Signed)
ACUTE VISIT Chief Complaint  Patient presents with   Bronchitis    X a week, productive cough. H/o of asthma. Has been using OTC Mucinex & Tussionex cough syrup.    HPI: [name] is a 41 y.o. male with hx of asthma and hypothyroidism here today complaining of a week of productive cough, symptoms are worse in the morning. He has had similar symptoms in the past, for which Tussionex was prescribed, he would like a refill.  Cough This is a new problem. The current episode started in the past 7 days. The problem has been unchanged. The problem occurs every few hours. The cough is Productive of sputum. Associated symptoms include headaches, nasal congestion, postnasal drip, rhinorrhea and wheezing. Pertinent negatives include no chest pain, chills, ear congestion, ear pain, eye redness, fever, heartburn, hemoptysis, myalgias, rash, sore throat, shortness of breath, sweats or weight loss. Nothing aggravates the symptoms. Risk factors for lung disease include travel. He has tried OTC cough suppressant, a beta-agonist inhaler and rest for the symptoms. The treatment provided mild relief. His past medical history is significant for asthma and environmental allergies.  "Little" wheezing. He has history of asthma. He is using albuterol once per day for the past 3 days and started Mucinex. "Little" headache usually when coughing.  Back from [place] 3 days ago, symptoms started 2 days before. No sick contact. Allergy rhinitis: He is not on treatment at this time, states that Flonase does not help. No history of tobacco use.  COVID-19 home test negative.  Review of Systems  Constitutional:  Negative for chills, fever and weight loss.  HENT:  Positive for postnasal drip and rhinorrhea. Negative for ear pain and sore throat.   Eyes:  Negative for discharge and redness.  Respiratory:  Positive for cough and wheezing. Negative for hemoptysis and shortness of breath.   Cardiovascular:   Negative for chest pain.  Gastrointestinal:  Negative for abdominal pain, heartburn and nausea.  Genitourinary:  Negative for decreased urine volume, dysuria and hematuria.  Musculoskeletal:  Negative for joint swelling and myalgias.  Skin:  Negative for rash.  Allergic/Immunologic: Positive for environmental allergies.  Neurological:  Positive for headaches. Negative for syncope and weakness.  Hematological:  Negative for adenopathy. Does not bruise/bleed easily.   Rest see pertinent positives and negatives per HPI.  Current Outpatient Medications on File Prior to Visit  Medication Sig Dispense Refill   cyanocobalamin (,VITAMIN B-12,) 1000 MCG/ML injection INJECT 1 ML IN THE DELTOID ONCE A WEEK FOR A MONTH. THEN INJECT 1 ML ONCE A MONTH THEREAFTER. 6 mL 1   cyclobenzaprine (FLEXERIL) 10 MG tablet Take 1 tablet (10 mg total) by mouth 3 (three) times daily as needed for muscle spasms. 60 tablet 0   levothyroxine (SYNTHROID) 175 MCG tablet Take 1 tablet (175 mcg total) by mouth daily. 90 tablet 1   mupirocin cream (BACTROBAN) 2 % Apply 1 application. topically 2 (two) times daily. 30 g 5   SYRINGE-NEEDLE, DISP, 3 ML (B-D INTEGRA SYRINGE) 23G X 1" 3 ML MISC Use for b12 injections 100 each 3   No current facility-administered medications on file prior to visit.   Past Medical History:  Diagnosis Date   Anxiety    Asthma    GERD (gastroesophageal reflux disease)    Sleep apnea    Allergies  Allergen Reactions   Amoxicillin Nausea And Vomiting   Montelukast Other (See Comments)    Causes forgetfulness     Social History  Socioeconomic History   Marital status: Married    Spouse name: [place]   Number of children: 2   Years of education: MBA   Highest education level: Not on file  Occupational History   Not on file  Tobacco Use   Smoking status: Never   Smokeless tobacco: Never  Vaping Use   Vaping Use: Never used  Substance and Sexual Activity   Alcohol use: Not  Currently    Alcohol/week: 2.0 standard drinks of alcohol    Types: 2 Glasses of wine per week    Comment: quit drinking heavily 2 months ago   Drug use: No   Sexual activity: Yes    Birth control/protection: Surgical  Other Topics Concern   Not on file  Social History Narrative   09/30/18   Lives with wife and 2 children - twin boy (Vincenzo) and girl ([name]) - born 2017   Enjoys: drag race, working on cars - trying to start a business   Work: Acupuncturist   Diet: eats good - grilled, avoids fried foods, carbs; does not eat very frequent   Exercise: does cardio every week   Social Determinants of Corporate investment banker Strain: Low Risk  (09/30/2018)   Overall Financial Resource Strain (CARDIA)    Difficulty of Paying Living Expenses: Not hard at all  Food Insecurity: Not on file  Transportation Needs: Not on file  Physical Activity: Not on file  Stress: Not on file  Social Connections: Not on file   Vitals:   02/06/22 0812  BP: 120/78  Pulse: 94  Resp: 12  Temp: 98.7 F (37.1 C)  SpO2: 98%   Body mass index is 38.54 kg/m.  Physical Exam Vitals and nursing note reviewed.  Constitutional:      General: He is not in acute distress.    Appearance: He is well-developed. He is not ill-appearing.  HENT:     Head: Normocephalic and atraumatic.     Right Ear: Tympanic membrane, ear canal and external ear normal.     Left Ear: Tympanic membrane, ear canal and external ear normal.     Nose: Congestion and rhinorrhea present.     Right Turbinates: Enlarged.     Left Turbinates: Enlarged.     Mouth/Throat:     Mouth: Mucous membranes are moist.     Pharynx: Oropharynx is clear.     Comments: Postnasal drainage. Eyes:     Conjunctiva/sclera: Conjunctivae normal.  Cardiovascular:     Rate and Rhythm: Normal rate and regular rhythm.     Heart sounds: No murmur heard. Pulmonary:     Effort: Pulmonary effort is normal. No respiratory distress.     Breath  sounds: Normal breath sounds. No stridor.  Lymphadenopathy:     Cervical: No cervical adenopathy.  Skin:    General: Skin is warm.     Findings: No erythema or rash.  Neurological:     Mental Status: He is alert and oriented to person, place, and time.  Psychiatric:        Speech: Speech normal.     Comments: Well groomed, good eye contact.   ASSESSMENT AND PLAN:  Mr.Duquan was seen today for bronchitis.  Diagnoses and all orders for this visit:  Mild intermittent reactive airway disease without complication With mild symptoms, today I do not appreciate wheezing. Recommend albuterol inhaler 2 puff every 6 hours for 1 week and then continue as needed. I do not think prednisone is needed at  this time, prescription sent and instructed to start if he has wheezing again. Instructed about warning signs. Follow-up with PCP in 2 weeks if still symptomatic.  -     albuterol (VENTOLIN HFA) 108 (90 Base) MCG/ACT inhaler; Inhale 1 puff into the lungs every 6 (six) hours as needed for wheezing or shortness of breath. -     predniSONE (DELTASONE) 20 MG tablet; Take 2 tablets (40 mg total) by mouth daily with breakfast for 3 days.  Allergic rhinitis, unspecified seasonality, unspecified trigger We discussed differential diagnosis, which includes a viral illness. Prednisone may help. He is not interested in intranasal steroid, it has not helped in the past. Nasal saline irrigations as needed recommended, a OTC antihistaminic may also help.  -     predniSONE (DELTASONE) 20 MG tablet; Take 2 tablets (40 mg total) by mouth daily with breakfast for 3 days.  Cough, unspecified type Lung auscultation negative today. I do not think imaging is needed at this time. Tussionex prescription sent as requested to take just at bedtime. Follow-up with PCP in 2 weeks if cough has not resolved, before if needed.  -     chlorpheniramine-HYDROcodone (TUSSIONEX PENNKINETIC ER) 10-8 MG/5ML; Take 5 mLs by mouth  at bedtime as needed for up to 10 days for cough.  Return if symptoms worsen or fail to improve.  Wallace Gappa G. [place], MD  [facility]. [place] office.

## 2022-02-06 ENCOUNTER — Ambulatory Visit (INDEPENDENT_AMBULATORY_CARE_PROVIDER_SITE_OTHER): Payer: BC Managed Care – PPO | Admitting: Family Medicine

## 2022-02-06 ENCOUNTER — Encounter: Payer: Self-pay | Admitting: Family Medicine

## 2022-02-06 VITALS — BP 120/78 | HR 94 | Temp 98.7°F | Resp 12 | Ht 73.0 in | Wt 292.1 lb

## 2022-02-06 DIAGNOSIS — J309 Allergic rhinitis, unspecified: Secondary | ICD-10-CM

## 2022-02-06 DIAGNOSIS — R059 Cough, unspecified: Secondary | ICD-10-CM

## 2022-02-06 DIAGNOSIS — J4520 Mild intermittent asthma, uncomplicated: Secondary | ICD-10-CM

## 2022-02-06 MED ORDER — HYDROCOD POLI-CHLORPHE POLI ER 10-8 MG/5ML PO SUER: 5.0000 mL | Freq: Every evening | ORAL | 0 refills | Status: AC | PRN

## 2022-02-06 MED ORDER — PREDNISONE 20 MG PO TABS: 40.0000 mg | ORAL_TABLET | Freq: Every day | ORAL | 0 refills | Status: AC

## 2022-02-06 MED ORDER — ALBUTEROL SULFATE HFA 108 (90 BASE) MCG/ACT IN AERS: 1.0000 | INHALATION_SPRAY | Freq: Four times a day (QID) | RESPIRATORY_TRACT | 0 refills | Status: DC | PRN

## 2022-02-07 DIAGNOSIS — G4733 Obstructive sleep apnea (adult) (pediatric): Secondary | ICD-10-CM | POA: Diagnosis not present

## 2022-02-28 ENCOUNTER — Other Ambulatory Visit: Payer: Self-pay | Admitting: Family Medicine

## 2022-02-28 DIAGNOSIS — J4520 Mild intermittent asthma, uncomplicated: Secondary | ICD-10-CM

## 2022-03-09 DIAGNOSIS — G4733 Obstructive sleep apnea (adult) (pediatric): Secondary | ICD-10-CM | POA: Diagnosis not present

## 2022-04-09 DIAGNOSIS — G4733 Obstructive sleep apnea (adult) (pediatric): Secondary | ICD-10-CM | POA: Diagnosis not present

## 2022-04-19 ENCOUNTER — Encounter: Payer: Self-pay | Admitting: Internal Medicine

## 2022-05-10 ENCOUNTER — Ambulatory Visit: Payer: BC Managed Care – PPO | Admitting: Family

## 2022-05-10 ENCOUNTER — Encounter: Payer: Self-pay | Admitting: Family

## 2022-05-10 VITALS — BP 129/78 | HR 73 | Temp 98.3°F | Ht 73.0 in | Wt 288.0 lb

## 2022-05-10 DIAGNOSIS — L0293 Carbuncle, unspecified: Secondary | ICD-10-CM | POA: Diagnosis not present

## 2022-05-10 DIAGNOSIS — G4733 Obstructive sleep apnea (adult) (pediatric): Secondary | ICD-10-CM | POA: Diagnosis not present

## 2022-05-10 MED ORDER — DOXYCYCLINE HYCLATE 100 MG PO TABS: 100.0000 mg | ORAL_TABLET | Freq: Two times a day (BID) | ORAL | 0 refills | Status: AC

## 2022-05-10 NOTE — Patient Instructions (Signed)
It was very nice to see you today!   I have sent over the Doxycycline antibiotic to your pharmacy. Start this today. Keep your underarm clean - wash with the Hibiclens antibacterial soap at least daily. If the boil continues to drain, just cover with a large bandaid until it stops draining.  Do not use any Deoderant or antiperspirant until the boils are healed.  As a preventative treatment, you should wash daily using a loofah sponge or mildly abrasive washcloth with the Hibiclens soap in all areas prone to getting boils. And then wash the rest of your body with your normal body wash or soap.    PLEASE NOTE:  If you had any lab tests please let us know if you have not heard back within a few days. You may see your results on MyChart before we have a chance to review them but we will give you a call once they are reviewed by [place]. If we ordered any referrals today, please let us know if you have not heard from their office within the next week.

## 2022-05-10 NOTE — Progress Notes (Signed)
Patient ID: Carlos Wheeler, male    DOB: 1981-05-24, 40 y.o.   MRN: [id number]  Chief Complaint  Patient presents with   Recurrent Skin Infections    Pt c/o boil under left arm for two days. Has tried neosporin and it has not opened. Pt states he gets boils all the time.    HPI:      Recurrent boils:  reports having under both arm pits, and down his sides in past, had to have an antibiotic to get rid of, last time was over a year. States that he not noticed if these boils have opened, but does notice some moisture under his arm. Reports tenderness and redness.      Assessment & Plan:  1. Recurrent boils left axilla - sending DOXY, advised on use & SE, advised to wash underarm with Hibiclens soap and use as preventative with mildly abrasive washcloth daily.   - doxycycline (VIBRA-TABS) 100 MG tablet; Take 1 tablet (100 mg total) by mouth 2 (two) times daily for 10 days.  Dispense: 20 tablet; Refill: 0  Subjective:    Outpatient Medications Prior to Visit  Medication Sig Dispense Refill   albuterol (VENTOLIN HFA) 108 (90 Base) MCG/ACT inhaler INHALE 1 PUFF INTO THE LUNGS EVERY 6 HOURS AS NEEDED FOR WHEEZING OR SHORTNESS OF BREATH. 8.5 each 2   cyanocobalamin (,VITAMIN B-12,) 1000 MCG/ML injection INJECT 1 ML IN THE DELTOID ONCE A WEEK FOR A MONTH. THEN INJECT 1 ML ONCE A MONTH THEREAFTER. 6 mL 1   cyclobenzaprine (FLEXERIL) 10 MG tablet Take 1 tablet (10 mg total) by mouth 3 (three) times daily as needed for muscle spasms. 60 tablet 0   levothyroxine (SYNTHROID) 175 MCG tablet Take 1 tablet (175 mcg total) by mouth daily. 90 tablet 1   mupirocin cream (BACTROBAN) 2 % Apply 1 application. topically 2 (two) times daily. 30 g 5   SYRINGE-NEEDLE, DISP, 3 ML (B-D INTEGRA SYRINGE) 23G X 1" 3 ML MISC Use for b12 injections 100 each 3   No facility-administered medications prior to visit.   Past Medical History:  Diagnosis Date   Anxiety    Asthma    GERD (gastroesophageal reflux  disease)    Sleep apnea    Past Surgical History:  Procedure Laterality Date   NO PAST SURGERIES     Allergies  Allergen Reactions   Amoxicillin Nausea And Vomiting   Montelukast Other (See Comments)    Causes forgetfulness       Objective:    Physical Exam Vitals and nursing note reviewed.  Constitutional:      General: He is not in acute distress.    Appearance: Normal appearance.  HENT:     Head: Normocephalic.  Cardiovascular:     Rate and Rhythm: Normal rate and regular rhythm.  Pulmonary:     Effort: Pulmonary effort is normal.     Breath sounds: Normal breath sounds.  Musculoskeletal:        General: Normal range of motion.     Cervical back: Normal range of motion.  Skin:    General: Skin is warm and dry.     Findings: Abscess (left axilla, several swollen, erythema, one starting to spontaneously drain) present.  Neurological:     Mental Status: He is alert and oriented to person, place, and time.  Psychiatric:        Mood and Affect: Mood normal.    BP 129/78 (BP Location: Left Arm, Patient Position: Sitting, Cuff  Size: Large)   Pulse 73   Temp 98.3 F (36.8 C) (Temporal)   Ht 6\' 1"  (1.854 m)   Wt 288 lb (130.6 kg)   SpO2 96%   BMI 38.00 kg/m  Wt Readings from Last 3 Encounters:  05/10/22 288 lb (130.6 kg)  02/06/22 292 lb 2 oz (132.5 kg)  11/21/21 295 lb (133.8 kg)       Jeanie Sewer, NP

## 2022-06-06 ENCOUNTER — Encounter: Payer: Self-pay | Admitting: Internal Medicine

## 2022-06-18 ENCOUNTER — Ambulatory Visit (INDEPENDENT_AMBULATORY_CARE_PROVIDER_SITE_OTHER): Payer: BC Managed Care – PPO | Admitting: Family Medicine

## 2022-06-18 ENCOUNTER — Encounter: Payer: Self-pay | Admitting: Family Medicine

## 2022-06-18 VITALS — BP 128/80 | HR 80 | Temp 99.2°F | Wt 286.0 lb

## 2022-06-18 DIAGNOSIS — R059 Cough, unspecified: Secondary | ICD-10-CM | POA: Diagnosis not present

## 2022-06-18 DIAGNOSIS — J40 Bronchitis, not specified as acute or chronic: Secondary | ICD-10-CM | POA: Diagnosis not present

## 2022-06-18 DIAGNOSIS — R0989 Other specified symptoms and signs involving the circulatory and respiratory systems: Secondary | ICD-10-CM

## 2022-06-18 LAB — POC INFLUENZA A&B (BINAX/QUICKVUE)
Influenza A, POC: NEGATIVE
Influenza B, POC: NEGATIVE

## 2022-06-18 LAB — POC COVID19 BINAXNOW: SARS Coronavirus 2 Ag: NEGATIVE

## 2022-06-18 MED ORDER — HYDROCODONE BIT-HOMATROP MBR 5-1.5 MG/5ML PO SOLN: 5.0000 mL | ORAL | 0 refills | Status: DC | PRN

## 2022-06-18 MED ORDER — AZITHROMYCIN 250 MG PO TABS: ORAL_TABLET | ORAL | 0 refills | Status: DC

## 2022-06-18 NOTE — Progress Notes (Signed)
   Subjective:    Patient ID: Carlos Wheeler, male    DOB: 08-27-1980, 41 y.o.   MRN: [id number]  HPI Here for 3 days of chest congestion, wheezing, and coughing up yellow sputum. No fever or SOB.    Review of Systems  Constitutional: Negative.   HENT:  Positive for congestion and postnasal drip. Negative for ear pain, sinus pain and sore throat.   Eyes: Negative.   Respiratory:  Positive for cough and wheezing. Negative for shortness of breath.        Objective:   Physical Exam Constitutional:      Appearance: Normal appearance. He is not ill-appearing.  HENT:     Right Ear: Tympanic membrane, ear canal and external ear normal.     Left Ear: Tympanic membrane, ear canal and external ear normal.     Nose: Nose normal.     Mouth/Throat:     Pharynx: Oropharynx is clear.  Eyes:     Conjunctiva/sclera: Conjunctivae normal.  Pulmonary:     Effort: Pulmonary effort is normal.     Breath sounds: No rhonchi or rales.     Comments: Scattered wheezes  Lymphadenopathy:     Cervical: No cervical adenopathy.  Neurological:     Mental Status: He is alert.           Assessment & Plan:  Bronchitis, treat with a Zpack. He can use his inhaler as needed.  Alysia Penna, MD

## 2022-06-27 ENCOUNTER — Ambulatory Visit: Payer: Self-pay

## 2022-06-27 ENCOUNTER — Ambulatory Visit (INDEPENDENT_AMBULATORY_CARE_PROVIDER_SITE_OTHER): Payer: BC Managed Care – PPO | Admitting: Family Medicine

## 2022-06-27 ENCOUNTER — Ambulatory Visit (INDEPENDENT_AMBULATORY_CARE_PROVIDER_SITE_OTHER): Payer: BC Managed Care – PPO

## 2022-06-27 VITALS — BP 122/68 | Ht 73.0 in | Wt 290.0 lb

## 2022-06-27 DIAGNOSIS — M25531 Pain in right wrist: Secondary | ICD-10-CM

## 2022-06-27 LAB — BASIC METABOLIC PANEL
BUN: 21 mg/dL (ref 6–23)
CO2: 30 mEq/L (ref 19–32)
Calcium: 9.5 mg/dL (ref 8.4–10.5)
Chloride: 102 mEq/L (ref 96–112)
Creatinine, Ser: 1.12 mg/dL (ref 0.40–1.50)
GFR: 82.82 mL/min (ref >60.00)
Glucose, Bld: 133 mg/dL — ABNORMAL HIGH (ref 70–99)
Potassium: 3.7 mEq/L (ref 3.5–5.1)
Sodium: 139 mEq/L (ref 135–145)

## 2022-06-27 NOTE — Patient Instructions (Signed)
Thank you for coming in today.   Please get an Xray today before you leave   Call or go to the ER if you develop a large red swollen joint with extreme pain or oozing puss.    Please get labs today before you leave   Ok to use a thumb loop.

## 2022-06-27 NOTE — Progress Notes (Unsigned)
I, Philbert Riser, LAT, ATC acting as a scribe for Carlos Graham, MD.  Carlos Wheeler is a 41 y.o. male who presents to [facility] today for R wrist pain. Pt was previously seen by Dr. Denyse Amass on 01/30/21 for L shoulder pain. Today, pt c/o R wrist pain ongoing for about 2 weeks. Pt notes limited wrist flex/extension and pain w/ finger extension and pronation/supination. 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 Pt locates pain to within the wrist joint, over the middle of the carpals  R wrist swelling: yes Grip strength: normal Paresthesias: yes- distal phalnx of the 1st-2nd Aggravates: opening a bottle, pronation/supination, wrist flex/ext Treatments tried: wrist brace, IBU  Pertinent review of systems: No fevers or chills  Relevant historical information: CKD and sleep apnea.   Exam:  BP 122/68   Ht 6\' 1"  (1.854 m)   Wt 290 lb (131.5 kg)   BMI 38.26 kg/m  General: Well Developed, well nourished, and in no acute distress.   MSK: Right wrist moderate effusion.  Decreased motion with limited motion to extension and flexion.    Pain with resisted finger extension.  Not much pain with resisted wrist extension or flexion however. Pulses cap refill and sensation are intact distally.    Lab and Radiology Results  Procedure: Real-time Ultrasound Guided Injection of right wrist dorsal radiocarpal joint Device: Philips Affiniti 50G Images permanently stored and available for review in PACS Ultrasound evaluation prior to injection shows a moderate wrist effusion.  Minimal fourth and 6 dorsal tendon compartment tenosynovitis changes. Verbal informed consent obtained.  Discussed risks and benefits of procedure. Warned about infection, bleeding, hyperglycemia damage to structures among others. Patient expresses understanding and agreement Time-out conducted.   Noted no overlying erythema, induration, or other signs  of local infection.   Skin prepped in a sterile fashion.   Local anesthesia: Topical Ethyl chloride.   With sterile technique and under real time ultrasound guidance: 40 mg of Kenalog and 1 mL of lidocaine injected into dorsal wrist joint. Fluid seen entering the joint.   Completed without difficulty   Pain immediately resolved suggesting accurate placement of the medication.   Advised to call if fevers/chills, erythema, induration, drainage, or persistent bleeding.   Images permanently stored and available for review in the ultrasound unit.  Impression: Technically successful ultrasound guided injection.    X-ray images right wrist obtained today personally and independently interpreted Minimal degenerative changes.  No acute fractures. Await formal radiology review   Assessment and Plan: 41 y.o. male with left wrist effusion.  Etiology is unclear.  He does not have much of an injury history to explain his pain.  Plan for injection as above and gout work-up.   PDMP not reviewed this encounter. Orders Placed This Encounter  Procedures   24 LIMITED JOINT SPACE STRUCTURES UP RIGHT(NO LINKED CHARGES)    Order Specific Question:   Reason for Exam (SYMPTOM  OR DIAGNOSIS REQUIRED)    Answer:   right wrist pain    Order Specific Question:   Preferred imaging location?    Answer:   [facility] Wrist Complete Right    Standing Status:   Future    Number of Occurrences:   1    Standing Expiration Date:   07/27/2022    Order Specific Question:   Reason for Exam (SYMPTOM  OR DIAGNOSIS REQUIRED)  Answer:   right wrist pain    Order Specific Question:   Preferred imaging location?    Answer:   [facility]   Uric acid    Standing Status:   Future    Standing Expiration Date:   06/28/2023   Basic Metabolic Panel (BMET)   No orders of the defined types were placed in this encounter.    Discussed warning signs or symptoms. Please see discharge  instructions. Patient expresses understanding.   The above documentation has been reviewed and is accurate and complete Carlos Wheeler, M.D.

## 2022-06-28 LAB — URIC ACID: Uric Acid, Serum: 7 mg/dL (ref 4.0–7.8)

## 2022-06-29 MED ORDER — COLCHICINE 0.6 MG PO TABS: 0.6000 mg | ORAL_TABLET | Freq: Every day | ORAL | 2 refills | Status: DC | PRN

## 2022-06-29 MED ORDER — ALLOPURINOL 300 MG PO TABS: 300.0000 mg | ORAL_TABLET | Freq: Every day | ORAL | 3 refills | Status: DC

## 2022-06-29 NOTE — Progress Notes (Signed)
Uric acid is a little more elevated than I would like at 7.0.  The goal is less than 6 for gout. Plan to start colchicine daily.  This is a medicine to treat potential gout flares right now. Also prescribed allopurinol.  This is a medicine to take daily to lower uric acid and ultimately stop gout from happening at all.  Recommend recheck in 1 month.

## 2022-06-29 NOTE — Addendum Note (Signed)
Addended by: Rodolph Bong on: 06/29/2022 06:26 AM   Modules accepted: Orders

## 2022-06-29 NOTE — Progress Notes (Signed)
Right wrist x-ray looks okay to radiology.  No fracture.  No severe arthritis.

## 2022-07-20 ENCOUNTER — Other Ambulatory Visit: Payer: Self-pay | Admitting: Internal Medicine

## 2022-07-20 DIAGNOSIS — E038 Other specified hypothyroidism: Secondary | ICD-10-CM

## 2022-07-24 DIAGNOSIS — G4733 Obstructive sleep apnea (adult) (pediatric): Secondary | ICD-10-CM | POA: Diagnosis not present

## 2022-07-30 ENCOUNTER — Ambulatory Visit (INDEPENDENT_AMBULATORY_CARE_PROVIDER_SITE_OTHER): Payer: BC Managed Care – PPO | Admitting: Family Medicine

## 2022-07-30 ENCOUNTER — Encounter: Payer: Self-pay | Admitting: Family Medicine

## 2022-07-30 VITALS — BP 122/60 | HR 75 | Temp 98.1°F | Ht 73.0 in | Wt 288.2 lb

## 2022-07-30 DIAGNOSIS — E559 Vitamin D deficiency, unspecified: Secondary | ICD-10-CM

## 2022-07-30 DIAGNOSIS — E038 Other specified hypothyroidism: Secondary | ICD-10-CM
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
## 2022-07-30 NOTE — Assessment & Plan Note (Signed)
On 175 mcg daily, has been on this dose for the last year and was previously controlled, will order new TSH today

## 2022-07-30 NOTE — Assessment & Plan Note (Signed)
Pt states he gets joint pain when his vitamin D is low and his TSH is high, will order new vitamin D level for him today. Advised he use the cyclobenzaprine at home that was previously written for him and also PRN ibuprofen.

## 2022-07-31 LAB — VITAMIN D 25 HYDROXY (VIT D DEFICIENCY, FRACTURES): VITD: 26.59 ng/mL — ABNORMAL LOW (ref 30.00–100.00)

## 2022-07-31 LAB — TSH: TSH: 2.82 u[IU]/mL (ref 0.35–5.50)

## 2022-08-03 ENCOUNTER — Ambulatory Visit: Payer: BC Managed Care – PPO | Admitting: Sports Medicine

## 2022-08-03 VITALS — HR 83 | Ht 73.0 in | Wt 288.0 lb

## 2022-08-03 DIAGNOSIS — S0300XA Dislocation of jaw, unspecified side, initial encounter: Secondary | ICD-10-CM | POA: Diagnosis not present

## 2022-08-03 MED ORDER — MELOXICAM 15 MG PO TABS: 15.0000 mg | ORAL_TABLET | Freq: Every day | ORAL | 0 refills | Status: DC
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
## 2022-08-03 NOTE — Patient Instructions (Addendum)
Good to see you  - Start meloxicam 15 mg daily x2 weeks.  If still having pain after 2 weeks, complete 3rd-week of meloxicam. May use remaining meloxicam as needed once daily for pain control.  Do not to use additional NSAIDs while taking meloxicam.  May use Tylenol [phone] mg 2 to 3 times a day for breakthrough pain. Stop ibuprofen Jaw movements  As needed if no improvement 3-4 week follow up

## 2022-08-24 DIAGNOSIS — G4733 Obstructive sleep apnea (adult) (pediatric): Secondary | ICD-10-CM | POA: Diagnosis not present

## 2022-08-30 ENCOUNTER — Other Ambulatory Visit: Payer: Self-pay | Admitting: Sports Medicine

## 2022-09-24 DIAGNOSIS — G4733 Obstructive sleep apnea (adult) (pediatric): Secondary | ICD-10-CM | POA: Diagnosis not present

## 2022-09-25 ENCOUNTER — Other Ambulatory Visit: Payer: Self-pay | Admitting: Internal Medicine

## 2022-09-25 ENCOUNTER — Ambulatory Visit: Payer: BC Managed Care – PPO | Admitting: Internal Medicine

## 2022-09-25 DIAGNOSIS — J309 Allergic rhinitis, unspecified: Secondary | ICD-10-CM

## 2022-09-25 DIAGNOSIS — J4520 Mild intermittent asthma, uncomplicated: Secondary | ICD-10-CM

## 2022-10-22 DIAGNOSIS — G4733 Obstructive sleep apnea (adult) (pediatric): Secondary | ICD-10-CM | POA: Diagnosis not present

## 2022-10-23 DIAGNOSIS — G4733 Obstructive sleep apnea (adult) (pediatric): Secondary | ICD-10-CM | POA: Diagnosis not present

## 2022-11-04 ENCOUNTER — Ambulatory Visit
Admission: EM | Admit: 2022-11-04 | Discharge: 2022-11-04 | Disposition: A | Discharge status: Home / Self Care | Payer: BC Managed Care – PPO | Attending: Emergency Medicine | Admitting: Emergency Medicine

## 2022-11-04 DIAGNOSIS — J4521 Mild intermittent asthma with (acute) exacerbation: Secondary | ICD-10-CM | POA: Diagnosis not present

## 2022-11-04 DIAGNOSIS — J302 Other seasonal allergic rhinitis: Secondary | ICD-10-CM | POA: Diagnosis not present

## 2022-11-04 MED ORDER — PREDNISONE 10 MG PO TABS: 40.0000 mg | ORAL_TABLET | Freq: Every day | ORAL | 0 refills | Status: AC

## 2022-11-04 MED ORDER — PROMETHAZINE-DM 6.25-15 MG/5ML PO SYRP: 5.0000 mL | ORAL_SOLUTION | Freq: Four times a day (QID) | ORAL | 0 refills | Status: DC | PRN

## 2022-11-04 NOTE — Discharge Instructions (Addendum)
Continue to use your albuterol inhaler.  Take the prednisone as directed.    Take the promethazine DM as directed.  Do not drive, operate machinery, drink alcohol, or perform dangerous activities while taking this medication as it may cause drowsiness.  Follow up with your primary care provider if your symptoms are not improving.

## 2022-11-04 NOTE — ED Triage Notes (Signed)
Patient to Urgent Care with complaints of cough. Reports cough is productive. Wheezing at night/ when laying down. Head congestion/ drainage.  Reports concerns about bronchitis.

## 2022-11-04 NOTE — ED Provider Notes (Signed)
[facility]    CSN: [id number] Arrival date & time: 11/04/22  1432      History   Chief Complaint Chief Complaint  Patient presents with   Cough    HPI Carlos Wheeler is a 42 y.o. male.  Patient presents with 4-day history of congestion, postnasal drip, cough, wheezing at night.  No fever, chills, rash, sore throat, shortness of breath, or other symptoms.  Treating with albuterol inhaler and Mucinex.  His medical history includes asthma, obstructive sleep apnea, CKD 3.  The history is provided by the patient and medical records.    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   Acute shoulder bursitis, left 09/29/2020   Vitamin D deficiency 09/09/2020   Vitamin B12 deficiency 09/09/2020   Hyperlipidemia 08/25/2019   Hypothyroidism 08/25/2019   IGT (impaired glucose tolerance) 08/25/2019   CKD (chronic kidney disease) stage 3, GFR 30-59 ml/min (HCC) 06/09/2019   Elevated LFTs 06/09/2019   Hand numbness 06/09/2019   Loss of taste 05/11/2019   History of heavy alcohol consumption 05/10/2019   Low back pain 04/24/2019   Elevated random blood glucose level 02/05/2019   Anxiety 09/30/2018   Obesity (BMI 30.0-34.9) 12/04/2016   GAD (generalized anxiety disorder) 12/04/2016   Obstructive sleep apnea of adult 08/02/2016    Past Surgical History:  Procedure Laterality Date   NO PAST SURGERIES         Home Medications    Prior to Admission medications   Medication Sig Start Date End Date Taking? Authorizing Provider  predniSONE (DELTASONE) 10 MG tablet Take 4 tablets (40 mg total) by mouth daily for 5 days. 11/04/22 11/09/22 Yes Sharion Balloon, NP  promethazine-dextromethorphan (PROMETHAZINE-DM) 6.25-15 MG/5ML syrup Take 5 mLs by mouth 4 (four) times daily as needed. 11/04/22  Yes Sharion Balloon, NP  albuterol (VENTOLIN HFA) 108 (90 Base) MCG/ACT inhaler INHALE 1 PUFF INTO THE LUNGS EVERY 6 HOURS AS NEEDED FOR WHEEZING OR SHORTNESS OF BREATH. 02/28/22   Isaac Bliss, Rayford Halsted, MD  allopurinol (ZYLOPRIM) 300 MG tablet Take 1 tablet (300 mg total) by mouth daily. 06/29/22   Gregor Hams, MD  colchicine 0.6 MG tablet Take 1 tablet (0.6 mg total) by mouth daily as needed (gout or psuedogout pain). 06/29/22   Gregor Hams, MD  cyanocobalamin (,VITAMIN B-12,) 1000 MCG/ML injection INJECT 1 ML IN THE DELTOID ONCE A WEEK FOR A MONTH. THEN INJECT 1 ML ONCE A MONTH THEREAFTER. 08/01/21   Isaac Bliss, Rayford Halsted, MD  cyclobenzaprine (FLEXERIL) 10 MG tablet Take 1 tablet (10 mg total) by mouth 3 (three) times daily as needed for muscle spasms. 11/21/21   Laurey Morale, MD  levothyroxine (SYNTHROID) 175 MCG tablet TAKE 1 TABLET BY MOUTH EVERY DAY 07/23/22   Isaac Bliss, Rayford Halsted, MD  meloxicam (MOBIC) 15 MG tablet Take 1 tablet (15 mg total) by mouth daily. 08/03/22   Glennon Mac, DO  mupirocin cream (BACTROBAN) 2 % Apply 1 application. topically 2 (two) times daily. 11/21/21   Laurey Morale, MD  SYRINGE-NEEDLE, DISP, 3 ML (B-D INTEGRA SYRINGE) 23G X 1" 3 ML MISC Use for b12 injections 09/13/20   Isaac Bliss, Rayford Halsted, MD    Family History Family History  Problem  Relation Age of Onset   Hyperlipidemia Mother    Hypertension Mother    Heart attack Father 44   Heart disease Father    Hypertension Father    Diabetes Sister    Hyperlipidemia Sister    Hypertension Sister    Diabetes Maternal Grandmother    Hypertension Maternal Grandmother    Diabetes Maternal Grandfather    Hypertension Maternal Grandfather    Heart attack Maternal Grandfather 75   Diabetes Paternal Grandmother    Stroke Paternal Grandmother 24   Diabetes Paternal Grandfather    Cancer Neg Hx     Social History Social History   Tobacco Use   Smoking status: Never   Smokeless tobacco:  Never  Vaping Use   Vaping Use: Never used  Substance Use Topics   Alcohol use: Not Currently    Alcohol/week: 2.0 standard drinks of alcohol    Types: 2 Glasses of wine per week    Comment: quit drinking heavily 2 months ago   Drug use: No     Allergies   Amoxicillin and Montelukast   Review of Systems Review of Systems  Constitutional:  Negative for chills and fever.  HENT:  Positive for congestion and postnasal drip. Negative for ear pain and sore throat.   Respiratory:  Positive for cough and wheezing. Negative for shortness of breath.   Cardiovascular:  Negative for chest pain and palpitations.  Gastrointestinal:  Negative for diarrhea and vomiting.  Skin:  Negative for rash.  All other systems reviewed and are negative.    Physical Exam Triage Vital Signs ED Triage Vitals  Enc Vitals Group     BP 11/04/22 1504 123/80     Pulse Rate 11/04/22 1501 87     Resp 11/04/22 1501 18     Temp 11/04/22 1501 98.2 F (36.8 C)     Temp src --      SpO2 11/04/22 1501 95 %     Weight 11/04/22 1503 280 lb (127 kg)     Height 11/04/22 1503 6' 1.5" (1.867 m)     Head Circumference --      Peak Flow --      Pain Score 11/04/22 1500 0     Pain Loc --      Pain Edu? --      Excl. in [place]? --    No data found.  Updated Vital Signs BP 123/80   Pulse 87   Temp 98.2 F (36.8 C)   Resp 18   Ht 6' 1.5" (1.867 m)   Wt 280 lb (127 kg)   SpO2 95%   BMI 36.44 kg/m   Visual Acuity Right Eye Distance:   Left Eye Distance:   Bilateral Distance:    Right Eye Near:   Left Eye Near:    Bilateral Near:     Physical Exam Vitals and nursing note reviewed.  Constitutional:      General: He is not in acute distress.    Appearance: Normal appearance. He is well-developed. He is not ill-appearing.  HENT:     Right Ear: Tympanic membrane normal.     Left Ear: Tympanic membrane normal.     Nose: Nose normal.     Mouth/Throat:     Mouth: Mucous membranes are moist.     Pharynx:  Oropharynx is clear.     Comments: Clear PND. Cardiovascular:     Rate and Rhythm: Normal rate and regular rhythm.     Heart sounds: Normal  heart sounds.  Pulmonary:     Effort: Pulmonary effort is normal. No respiratory distress.     Breath sounds: Wheezing present.     Comments: Few faint expiratory wheezes. Musculoskeletal:     Cervical back: Neck supple.  Skin:    General: Skin is warm and dry.     Capillary Refill: Capillary refill takes less than 2 seconds.  Neurological:     Mental Status: He is alert.  Psychiatric:        Mood and Affect: Mood normal.        Behavior: Behavior normal.      UC Treatments / Results  Labs (all labs ordered are listed, but only abnormal results are displayed) Labs Reviewed - No data to display  EKG   Radiology No results found.  Procedures Procedures (including critical care time)  Medications Ordered in UC Medications - No data to display  Initial Impression / Assessment and Plan / UC Course  I have reviewed the triage vital signs and the nursing notes.  Pertinent labs & imaging results that were available during my care of the patient were reviewed by me and considered in my medical decision making (see chart for details).    Asthma exacerbation, seasonal allergies.  Treating with prednisone and continued use of albuterol inhaler.  Treating with promethazine DM for cough; precautions for drowsiness with this medication discussed.  Education provided on asthma.  Instructed patient to follow-up with his PCP if his symptoms are not improving.  He agrees to plan of care.  Final Clinical Impressions(s) / UC Diagnoses   Final diagnoses:  Mild intermittent asthma with acute exacerbation  Seasonal allergies     Discharge Instructions      Continue to use your albuterol inhaler.  Take the prednisone as directed.    Take the promethazine DM as directed.  Do not drive, operate machinery, drink alcohol, or perform dangerous  activities while taking this medication as it may cause drowsiness.  Follow up with your primary care provider if your symptoms are not improving.        ED Prescriptions     Medication Sig Dispense Auth. Provider   predniSONE (DELTASONE) 10 MG tablet Take 4 tablets (40 mg total) by mouth daily for 5 days. 20 tablet Sharion Balloon, NP   promethazine-dextromethorphan (PROMETHAZINE-DM) 6.25-15 MG/5ML syrup Take 5 mLs by mouth 4 (four) times daily as needed. 118 mL Sharion Balloon, NP      PDMP not reviewed this encounter.   Sharion Balloon, NP 11/04/22 1537

## 2022-11-05 ENCOUNTER — Telehealth (INDEPENDENT_AMBULATORY_CARE_PROVIDER_SITE_OTHER): Payer: BC Managed Care – PPO | Admitting: Family Medicine

## 2022-11-05 ENCOUNTER — Encounter: Payer: Self-pay | Admitting: Family Medicine

## 2022-11-05 VITALS — Ht 73.5 in

## 2022-11-05 DIAGNOSIS — J45901 Unspecified asthma with (acute) exacerbation: Secondary | ICD-10-CM

## 2022-11-05 NOTE — Progress Notes (Addendum)
Virtual Visit via Video Note I connected with Carlos Wheeler on 11/05/22 by a video enabled telemedicine application and verified that I am speaking with the correct person using two identifiers. Location patient: home Location provider:work office Persons participating in the virtual visit: patient, provider  I discussed the limitations of evaluation and management by telemedicine and the availability of in person appointments. The patient expressed understanding and agreed to proceed.  Chief Complaint  Patient presents with   Follow-up    Seen at urgent care; given prednisone; not having any relief.   HPI: Carlos Wheeler is a 42 yo male with past medical history significant for asthma, GERD, sleep apnea, and anxiety being seen today to follow-up on his recent ED visit. He was evaluated in the ED yesterday for 3 to 4 days of nasal congestion, rhinorrhea, postnasal drainage, productive cough, wheezing, and dyspnea. Productive cough, initially sputum was greenish, today it is clear. The wheezing has shown some improvement but persists. He states that shortness of breath is mild and it has not interfered with his daily activities.  He denies having a fever, chills, body aches, fatigue, sore throat, CP,palpitations, abdominal pain, nausea, vomiting, urinary symptoms, or skin rash. He was prescribed prednisone 10 mg 4 tablets daily for 5 days, which he started today.  He has some mild diarrhea after taking medication. He was also recommended to use albuterol inhaler 2 puff every 4-6 hours as needed. He reports a negative COVID-19 test during ED visit. Negative for new symptoms. Additionally, he has concerns about the necessity of antibiotics treatment.   ROS: See pertinent positives and negatives per HPI.  Past Medical History:  Diagnosis Date   Anxiety    Asthma    GERD (gastroesophageal reflux disease)    Sleep apnea     Past Surgical History:  Procedure Laterality Date    NO PAST SURGERIES     Family History  Problem Relation Age of Onset   Hyperlipidemia Mother    Hypertension Mother    Heart attack Father 60   Heart disease Father    Hypertension Father    Diabetes Sister    Hyperlipidemia Sister    Hypertension Sister    Diabetes Maternal Grandmother    Hypertension Maternal Grandmother    Diabetes Maternal Grandfather    Hypertension Maternal Grandfather    Heart attack Maternal Grandfather 20   Diabetes Paternal Grandmother    Stroke Paternal Grandmother 95   Diabetes Paternal Grandfather    Cancer Neg Hx     Social History   Socioeconomic History   Marital status: Married    Spouse name: Carlos Wheeler   Number of children: 2   Years of education: MBA   Highest education level: Not on file  Occupational History   Not on file  Tobacco Use   Smoking status: Never   Smokeless tobacco: Never  Vaping Use   Vaping Use: Never used  Substance and Sexual Activity   Alcohol use: Not Currently    Alcohol/week: 2.0 standard drinks of alcohol    Types: 2 Glasses of wine per week    Comment: quit drinking heavily 2 months ago   Drug use: No   Sexual activity: Yes    Birth control/protection: Surgical  Other Topics Concern   Not on file  Social History Narrative   09/30/18   Lives with wife and 2 children - twin boy (Carlos Wheeler) and girl (Carlos Wheeler) - born 2017   Enjoys: drag race, working on cars -  trying to start a business   Work: Art gallery manager   Diet: eats good - grilled, avoids fried foods, carbs; does not eat very frequent   Exercise: does cardio every week   Social Determinants of Health   Financial Resource Strain: Low Risk  (09/30/2018)   Overall Financial Resource Strain (CARDIA)    Difficulty of Paying Living Expenses: Not hard at all  Food Insecurity: Not on file  Transportation Needs: Not on file  Physical Activity: Not on file  Stress: Not on file  Social Connections: Not on file  Intimate Partner Violence: Not on file      Current Outpatient Medications:    albuterol (VENTOLIN HFA) 108 (90 Base) MCG/ACT inhaler, INHALE 1 PUFF INTO THE LUNGS EVERY 6 HOURS AS NEEDED FOR WHEEZING OR SHORTNESS OF BREATH., Disp: 8.5 each, Rfl: 2   allopurinol (ZYLOPRIM) 300 MG tablet, Take 1 tablet (300 mg total) by mouth daily., Disp: 30 tablet, Rfl: 3   colchicine 0.6 MG tablet, Take 1 tablet (0.6 mg total) by mouth daily as needed (gout or psuedogout pain)., Disp: 30 tablet, Rfl: 2   cyanocobalamin (,VITAMIN B-12,) 1000 MCG/ML injection, INJECT 1 ML IN THE DELTOID ONCE A WEEK FOR A MONTH. THEN INJECT 1 ML ONCE A MONTH THEREAFTER., Disp: 6 mL, Rfl: 1   cyclobenzaprine (FLEXERIL) 10 MG tablet, Take 1 tablet (10 mg total) by mouth 3 (three) times daily as needed for muscle spasms., Disp: 60 tablet, Rfl: 0   levothyroxine (SYNTHROID) 175 MCG tablet, TAKE 1 TABLET BY MOUTH EVERY DAY, Disp: 90 tablet, Rfl: 1   meloxicam (MOBIC) 15 MG tablet, Take 1 tablet (15 mg total) by mouth daily., Disp: 30 tablet, Rfl: 0   mupirocin cream (BACTROBAN) 2 %, Apply 1 application. topically 2 (two) times daily., Disp: 30 g, Rfl: 5   predniSONE (DELTASONE) 10 MG tablet, Take 4 tablets (40 mg total) by mouth daily for 5 days., Disp: 20 tablet, Rfl: 0   promethazine-dextromethorphan (PROMETHAZINE-DM) 6.25-15 MG/5ML syrup, Take 5 mLs by mouth 4 (four) times daily as needed., Disp: 118 mL, Rfl: 0   SYRINGE-NEEDLE, DISP, 3 ML (B-D INTEGRA SYRINGE) 23G X 1" 3 ML MISC, Use for b12 injections, Disp: 100 each, Rfl: 3  EXAM:  VITALS per patient if applicable:Ht 6' 1.5" ([id number] m)   BMI 36.44 kg/m   GENERAL: alert, oriented, appears well and in no acute distress  HEENT: atraumatic, conjunctiva clear, no obvious abnormalities on inspection of external nose and ears  NECK: normal movements of the head and neck  LUNGS: on inspection no signs of respiratory distress, breathing rate appears normal, no obvious gross SOB, gasping or wheezing No cough during  visit.  CV: no obvious cyanosis  MS: moves all visible extremities without noticeable abnormality  PSYCH/NEURO: pleasant and cooperative, no obvious depression or anxiety, speech and thought processing grossly intact  ASSESSMENT AND PLAN:  Discussed the following assessment and plan:  Mild asthma with exacerbation, unspecified whether persistent  We review ED note, breath sounds with "few faint expiratory wheezes." I do not think antibiotic treatment is needed at this time. Possible etiologies: Viral illness versus allergies. Offered chest x-ray if he is concerned about possible pneumonia, he decides to hold on this for now. He is already reporting some improvement, he just started prednisone treatment today.  We discussed some side effects, recommend taking prednisone with breakfast for 3 to 5 days. Albuterol inh 2 puff every 6 hours for a week then as needed for  wheezing or shortness of breath.  Monitor for new symptoms. He was clearly instructed about warning signs. He voices understanding and agrees with plan.  We discussed possible serious and likely etiologies, options for evaluation and workup, limitations of telemedicine visit vs in person visit, treatment, treatment risks and precautions. The patient was advised to call back or seek an in-person evaluation if the symptoms worsen or if the condition fails to improve as anticipated. I discussed the assessment and treatment plan with the patient. The patient was provided an opportunity to ask questions and all were answered. The patient agreed with the plan and demonstrated an understanding of the instructions.  No follow-ups on file. Nyesha Cliff G. Martin[place], MD  [facility]. [place] office.

## 2022-11-22 DIAGNOSIS — G4733 Obstructive sleep apnea (adult) (pediatric): Secondary | ICD-10-CM | POA: Diagnosis not present

## 2022-11-27 ENCOUNTER — Encounter: Payer: Self-pay | Admitting: Family Medicine

## 2022-11-27 ENCOUNTER — Ambulatory Visit: Payer: BC Managed Care – PPO | Admitting: Family Medicine

## 2022-11-27 VITALS — BP 122/78 | HR 82 | Temp 97.9°F | Wt 287.0 lb

## 2022-11-27 DIAGNOSIS — J45909 Unspecified asthma, uncomplicated: Secondary | ICD-10-CM | POA: Insufficient documentation

## 2022-11-27 DIAGNOSIS — J4520 Mild intermittent asthma, uncomplicated: Secondary | ICD-10-CM

## 2022-11-27 DIAGNOSIS — J40 Bronchitis, not specified as acute or chronic: Secondary | ICD-10-CM

## 2022-11-27 MED ORDER — HYDROCODONE BIT-HOMATROP MBR 5-1.5 MG/5ML PO SOLN: 5.0000 mL | ORAL | 0 refills | Status: DC | PRN

## 2022-11-27 MED ORDER — METHYLPREDNISOLONE ACETATE 80 MG/ML IJ SUSP
80.0000 mg | Freq: Once | INTRAMUSCULAR | Status: AC
  Administered 2022-11-27: 80 mg via INTRAMUSCULAR

## 2022-11-27 MED ORDER — ALBUTEROL SULFATE HFA 108 (90 BASE) MCG/ACT IN AERS: 2.0000 | INHALATION_SPRAY | RESPIRATORY_TRACT | 2 refills | Status: DC | PRN

## 2022-11-27 MED ORDER — AZITHROMYCIN 250 MG PO TABS: ORAL_TABLET | ORAL | 0 refills | Status: DC

## 2022-11-27 NOTE — Addendum Note (Signed)
Addended by: Carola Rhine on: 11/27/2022 02:25 PM   Modules accepted: Orders

## 2022-11-27 NOTE — Progress Notes (Signed)
   Subjective:    Patient ID: Carlos Wheeler, male    DOB: 02/27/1981, 42 y.o.   MRN: [id number]  HPI Here to follow up on a visit to urgent care on 11-04-22. For the past few weeks he has had intermittent sinus congestion, PND, chest tightness, wheezing, SOB, and a cough producing yellow sputum. No chest pain or fever. At urgent care he was given an oral steroid taper which helped for a few days. Then the symptoms returned.    Review of Systems  Constitutional: Negative.   HENT:  Positive for congestion, postnasal drip and sinus pressure. Negative for ear pain, sinus pain and sore throat.   Eyes: Negative.   Respiratory:  Positive for cough, chest tightness, shortness of breath and wheezing.   Cardiovascular: Negative.        Objective:   Physical Exam Constitutional:      Appearance: Normal appearance.  HENT:     Right Ear: Tympanic membrane, ear canal and external ear normal.     Left Ear: Tympanic membrane, ear canal and external ear normal.     Nose: Nose normal.     Mouth/Throat:     Pharynx: Oropharynx is clear.  Eyes:     Conjunctiva/sclera: Conjunctivae normal.  Pulmonary:     Effort: Pulmonary effort is normal.     Breath sounds: Wheezing and rhonchi present. No rales.  Lymphadenopathy:     Cervical: No cervical adenopathy.  Neurological:     Mental Status: He is alert.           Assessment & Plan:  Bronchitis, treat with a Zpack. He can use his albuterol inhaler as needed. Given a shot of DepoMedrol. We spent a total of (31   ) minutes reviewing records and discussing these issues.  Gershon Crane, MD

## 2022-12-03 DIAGNOSIS — J301 Allergic rhinitis due to pollen: Secondary | ICD-10-CM | POA: Diagnosis not present

## 2022-12-03 DIAGNOSIS — J343 Hypertrophy of nasal turbinates: Secondary | ICD-10-CM | POA: Diagnosis not present

## 2022-12-03 DIAGNOSIS — T485X5A Adverse effect of other anti-common-cold drugs, initial encounter: Secondary | ICD-10-CM | POA: Diagnosis not present

## 2022-12-22 DIAGNOSIS — G4733 Obstructive sleep apnea (adult) (pediatric): Secondary | ICD-10-CM | POA: Diagnosis not present

## 2022-12-28 ENCOUNTER — Other Ambulatory Visit: Payer: Self-pay | Admitting: Internal Medicine

## 2022-12-28 DIAGNOSIS — E038 Other specified hypothyroidism: Secondary | ICD-10-CM

## 2023-01-15 ENCOUNTER — Other Ambulatory Visit: Payer: Self-pay

## 2023-01-21 DIAGNOSIS — G4733 Obstructive sleep apnea (adult) (pediatric): Secondary | ICD-10-CM | POA: Diagnosis not present

## 2023-01-23 DIAGNOSIS — G4733 Obstructive sleep apnea (adult) (pediatric): Secondary | ICD-10-CM | POA: Diagnosis not present

## 2023-02-20 DIAGNOSIS — G4733 Obstructive sleep apnea (adult) (pediatric): Secondary | ICD-10-CM | POA: Diagnosis not present

## 2023-02-21 ENCOUNTER — Encounter: Payer: BC Managed Care – PPO | Admitting: Internal Medicine

## 2023-02-21 DIAGNOSIS — E038 Other specified hypothyroidism: Secondary | ICD-10-CM

## 2023-02-21 DIAGNOSIS — E559 Vitamin D deficiency, unspecified: Secondary | ICD-10-CM

## 2023-02-21 DIAGNOSIS — E538 Deficiency of other specified B group vitamins: Secondary | ICD-10-CM

## 2023-02-21 DIAGNOSIS — E785 Hyperlipidemia, unspecified: Secondary | ICD-10-CM

## 2023-02-21 DIAGNOSIS — R7302 Impaired glucose tolerance (oral): Secondary | ICD-10-CM

## 2023-02-26 ENCOUNTER — Encounter: Payer: Self-pay | Admitting: Internal Medicine

## 2023-02-26 ENCOUNTER — Ambulatory Visit (INDEPENDENT_AMBULATORY_CARE_PROVIDER_SITE_OTHER): Payer: BC Managed Care – PPO | Admitting: Internal Medicine

## 2023-02-26 VITALS — BP 130/64 | HR 94 | Temp 98.6°F | Ht 73.0 in | Wt 293.9 lb

## 2023-02-26 DIAGNOSIS — R7302 Impaired glucose tolerance (oral): Secondary | ICD-10-CM | POA: Diagnosis not present

## 2023-02-26 DIAGNOSIS — Z125 Encounter for screening for malignant neoplasm of prostate: Secondary | ICD-10-CM

## 2023-02-26 DIAGNOSIS — E785 Hyperlipidemia, unspecified: Secondary | ICD-10-CM

## 2023-02-26 DIAGNOSIS — E538 Deficiency of other specified B group vitamins: Secondary | ICD-10-CM

## 2023-02-26 DIAGNOSIS — E039 Hypothyroidism, unspecified: Secondary | ICD-10-CM

## 2023-02-26 DIAGNOSIS — E559 Vitamin D deficiency, unspecified: Secondary | ICD-10-CM | POA: Diagnosis not present

## 2023-02-26 DIAGNOSIS — Z0000 Encounter for general adult medical examination without abnormal findings: Secondary | ICD-10-CM

## 2023-02-26 NOTE — Progress Notes (Signed)
Established Patient Office Visit     CC/Reason for Visit: Annual preventive exam  HPI: Carlos Wheeler is a 42 y.o. male who is coming in today for the above mentioned reasons. Past Medical History is significant for: Hypothyroidism, vitamin D and B12 deficiencies.  He is feeling well and has no acute concerns or complaints.  He has routine eye and dental care.  Immunizations are up-to-date.   Past Medical/Surgical History: Past Medical History:  Diagnosis Date   Anxiety    Asthma    GERD (gastroesophageal reflux disease)    Sleep apnea     Past Surgical History:  Procedure Laterality Date   NO PAST SURGERIES      Social History:  reports that he has never smoked. He has never used smokeless tobacco. He reports that he does not currently use alcohol after a past usage of about 2.0 standard drinks of alcohol per week. He reports that he does not use drugs.  Allergies: Allergies  Allergen Reactions   Amoxicillin Nausea And Vomiting   Montelukast Other (See Comments)    Causes forgetfulness     Family History:  Family History  Problem Relation Age of Onset   Hyperlipidemia Mother    Hypertension Mother    Heart attack Father 59   Heart disease Father    Hypertension Father    Diabetes Sister    Hyperlipidemia Sister    Hypertension Sister    Diabetes Maternal Grandmother    Hypertension Maternal Grandmother    Diabetes Maternal Grandfather    Hypertension Maternal Grandfather    Heart attack Maternal Grandfather 42   Diabetes Paternal Grandmother    Stroke Paternal Grandmother 50   Diabetes Paternal Grandfather    Cancer Neg Hx      Current Outpatient Medications:    albuterol (VENTOLIN HFA) 108 (90 Base) MCG/ACT inhaler, Inhale 2 puffs into the lungs every 4 (four) hours as needed for wheezing or shortness of breath., Disp: 8.5 each, Rfl: 2   allopurinol (ZYLOPRIM) 300 MG tablet, Take 1 tablet (300 mg total) by mouth daily., Disp: 30 tablet,  Rfl: 3   azithromycin (ZITHROMAX Z-PAK) 250 MG tablet, As directed, Disp: 6 each, Rfl: 0   colchicine 0.6 MG tablet, Take 1 tablet (0.6 mg total) by mouth daily as needed (gout or psuedogout pain)., Disp: 30 tablet, Rfl: 2   cyanocobalamin (,VITAMIN B-12,) 1000 MCG/ML injection, INJECT 1 ML IN THE DELTOID ONCE A WEEK FOR A MONTH. THEN INJECT 1 ML ONCE A MONTH THEREAFTER., Disp: 6 mL, Rfl: 1   cyclobenzaprine (FLEXERIL) 10 MG tablet, Take 1 tablet (10 mg total) by mouth 3 (three) times daily as needed for muscle spasms., Disp: 60 tablet, Rfl: 0   HYDROcodone bit-homatropine (HYCODAN) 5-1.5 MG/5ML syrup, Take 5 mLs by mouth every 4 (four) hours as needed., Disp: 240 mL, Rfl: 0   levothyroxine (SYNTHROID) 175 MCG tablet, TAKE 1 TABLET BY MOUTH EVERY DAY, Disp: 90 tablet, Rfl: 0   meloxicam (MOBIC) 15 MG tablet, Take 1 tablet (15 mg total) by mouth daily., Disp: 30 tablet, Rfl: 0   mupirocin cream (BACTROBAN) 2 %, Apply 1 application. topically 2 (two) times daily., Disp: 30 g, Rfl: 5   SYRINGE-NEEDLE, DISP, 3 ML (B-D INTEGRA SYRINGE) 23G X 1" 3 ML MISC, Use for b12 injections, Disp: 100 each, Rfl: 3  Review of Systems:  Negative unless indicated in HPI.   Physical Exam: Vitals:   02/26/23 1529  BP: 130/64  Pulse: 94  Temp: 98.6 F (37 C)  TempSrc: Oral  SpO2: 97%  Weight: 293 lb 14.4 oz (133.3 kg)  Height: 6\' 1"  (1.854 m)    Body mass index is 38.78 kg/m.   Physical Exam Vitals reviewed.  Constitutional:      General: He is not in acute distress.    Appearance: Normal appearance. He is not ill-appearing, toxic-appearing or diaphoretic.  HENT:     Head: Normocephalic.     Right Ear: Tympanic membrane, ear canal and external ear normal. There is no impacted cerumen.     Left Ear: Tympanic membrane, ear canal and external ear normal. There is no impacted cerumen.     Nose: Nose normal.     Mouth/Throat:     Mouth: Mucous membranes are moist.     Pharynx: Oropharynx is clear. No  oropharyngeal exudate or posterior oropharyngeal erythema.  Eyes:     General: No scleral icterus.       Right eye: No discharge.        Left eye: No discharge.     Conjunctiva/sclera: Conjunctivae normal.     Pupils: Pupils are equal, round, and reactive to light.  Neck:     Vascular: No carotid bruit.  Cardiovascular:     Rate and Rhythm: Normal rate and regular rhythm.     Pulses: Normal pulses.     Heart sounds: Normal heart sounds.  Pulmonary:     Effort: Pulmonary effort is normal. No respiratory distress.     Breath sounds: Normal breath sounds.  Abdominal:     General: Abdomen is flat. Bowel sounds are normal.     Palpations: Abdomen is soft.  Musculoskeletal:        General: Normal range of motion.     Cervical back: Normal range of motion.  Skin:    General: Skin is warm and dry.  Neurological:     General: No focal deficit present.     Mental Status: He is alert and oriented to person, place, and time. Mental status is at baseline.  Psychiatric:        Mood and Affect: Mood normal.        Behavior: Behavior normal.        Thought Content: Thought content normal.        Judgment: Judgment normal.     Flowsheet Row Office Visit from 02/26/2023 in [facility] [place]  PHQ-9 Total Score 1        Impression and Plan:  Vitamin D deficiency -     VITAMIN D 25 Hydroxy (Vit-D Deficiency, Fractures); Future  Hypothyroidism, unspecified type -     TSH; Future  Vitamin B12 deficiency -     Vitamin B12; Future  Hyperlipidemia, unspecified hyperlipidemia type  IGT (impaired glucose tolerance) -     Hemoglobin A1c  Encounter for preventive health examination -     CBC with Differential/Platelet; Future -     Comprehensive metabolic panel; Future -     Lipid panel; Future -     PSA; Future   -Recommend routine eye and dental care. -Healthy lifestyle discussed in detail. -Labs to be updated today. -Prostate cancer screening: PSA  today Health Maintenance  Topic Date Due   COVID-19 Vaccine (3 - Pfizer risk series) 05/06/2020   Flu Shot  03/14/2023   DTaP/Tdap/Td vaccine (4 - Td or Tdap) 04/23/2031   Hepatitis C Screening  Completed   HIV Screening  Completed  HPV Vaccine  Aged Out        Arisbel Maione Philip Aspen, MD [facility]

## 2023-02-27 ENCOUNTER — Other Ambulatory Visit: Payer: Self-pay | Admitting: Internal Medicine

## 2023-02-27 DIAGNOSIS — E782 Mixed hyperlipidemia: Secondary | ICD-10-CM

## 2023-02-27 LAB — CBC WITH DIFFERENTIAL/PLATELET
Basophils Absolute: 0 10*3/uL (ref 0.0–0.1)
Basophils Relative: 0.5 % (ref 0.0–3.0)
Eosinophils Absolute: 0.2 10*3/uL (ref 0.0–0.7)
Eosinophils Relative: 4.3 % (ref 0.0–5.0)
HCT: 39.3 % (ref 39.0–52.0)
Hemoglobin: 12.8 g/dL — ABNORMAL LOW (ref 13.0–17.0)
Lymphocytes Relative: 10.8 % — ABNORMAL LOW (ref 12.0–46.0)
Lymphs Abs: 0.6 10*3/uL — ABNORMAL LOW (ref 0.7–4.0)
MCHC: 32.6 g/dL (ref 30.0–36.0)
MCV: 90.8 fl (ref 78.0–100.0)
Monocytes Absolute: 0.6 10*3/uL (ref 0.1–1.0)
Monocytes Relative: 10.4 % (ref 3.0–12.0)
Neutro Abs: 3.9 10*3/uL (ref 1.4–7.7)
Neutrophils Relative %: 74 % (ref 43.0–77.0)
Platelets: 198 10*3/uL (ref 150.0–400.0)
RBC: 4.33 Mil/uL (ref 4.22–5.81)
RDW: 14.6 % (ref 11.5–15.5)
WBC: 5.3 10*3/uL (ref 4.0–10.5)

## 2023-02-27 LAB — COMPREHENSIVE METABOLIC PANEL
ALT: 22 U/L (ref 0–53)
AST: 21 U/L (ref 0–37)
Albumin: 4.6 g/dL (ref 3.5–5.2)
Alkaline Phosphatase: 63 U/L (ref 39–117)
BUN: 24 mg/dL — ABNORMAL HIGH (ref 6–23)
CO2: 27 mEq/L (ref 19–32)
Calcium: 9.8 mg/dL (ref 8.4–10.5)
Chloride: 106 mEq/L (ref 96–112)
Creatinine, Ser: 1.18 mg/dL (ref 0.40–1.50)
GFR: 77.43 mL/min (ref >60.00)
Glucose, Bld: 108 mg/dL — ABNORMAL HIGH (ref 70–99)
Potassium: 4 mEq/L (ref 3.5–5.1)
Sodium: 142 mEq/L (ref 135–145)
Total Bilirubin: 0.4 mg/dL (ref 0.2–1.2)
Total Protein: 7 g/dL (ref 6.0–8.3)

## 2023-02-27 LAB — LIPID PANEL
Cholesterol: 240 mg/dL — ABNORMAL HIGH (ref 0–200)
HDL: 62.5 mg/dL (ref >39.00)
NonHDL: 177.53
Total CHOL/HDL Ratio: 4
Triglycerides: 297 mg/dL — ABNORMAL HIGH (ref 0.0–149.0)
VLDL: 59.4 mg/dL — ABNORMAL HIGH (ref 0.0–40.0)

## 2023-02-27 LAB — TSH: TSH: 3.32 u[IU]/mL (ref 0.35–5.50)

## 2023-02-27 LAB — HEMOGLOBIN A1C: Hgb A1c MFr Bld: 6 % (ref 4.6–6.5)

## 2023-02-27 LAB — LDL CHOLESTEROL, DIRECT: Direct LDL: 147 mg/dL

## 2023-02-27 LAB — VITAMIN D 25 HYDROXY (VIT D DEFICIENCY, FRACTURES): VITD: 30.59 ng/mL (ref 30.00–100.00)

## 2023-02-27 LAB — PSA: PSA: 0.49 ng/mL (ref 0.10–4.00)

## 2023-02-27 LAB — VITAMIN B12: Vitamin B-12: 358 pg/mL (ref 211–911)

## 2023-02-28 ENCOUNTER — Encounter: Payer: Self-pay | Admitting: Internal Medicine

## 2023-02-28 MED ORDER — "BD INTEGRA SYRINGE 23G X 1"" 3 ML MISC": 3 refills | Status: DC

## 2023-03-20 NOTE — Progress Notes (Signed)
ACUTE VISIT Chief Complaint  Patient presents with   Rash    Pt reports rash on both of his nipples. he states heruns daily and his nipples are irrated from rubbing against shirt. The rash is itchy. Going on 3wks.    HPI: [name] is a 42 y.o. male with past medical history significant for hypothyroidism, OSA, CKD 3, abnormal LFTs, and GAD here today complaining of a very pruritic rash on both nipples, getting worse . States that he has had rash for a couple months but intense pruritus for a couple of weeks.  Rash This is a new problem. The current episode started more than 1 month ago. The problem has been gradually worsening since onset. The affected locations include the torso. The rash is characterized by dryness, peeling and itchiness. He was exposed to nothing. Pertinent negatives include no anorexia, congestion, cough, diarrhea, eye pain, facial edema, fatigue, joint pain, nail changes, rhinorrhea, shortness of breath or vomiting. Past treatments include anti-itch cream and antibiotic cream. The treatment provided mild relief. His past medical history is significant for allergies, asthma and eczema.   He reports that he has been running a lot and believes the rash may be due to sweating and friction from wearing a sweatsuit. This is a new issue for him, as he has never experienced this before.  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  He has tried over-the-counter treatments, including Benadryl, Campho-Phenique, Calamine lotion, and Neosporin, without significant improvement. He denies any recent exposure to individuals with similar rashes, fever, sore throat , or oral lesions.  He acknowledges that he has been scratching the affected areas, particularly at night, which has led to excoriations. He denies any pain in the nipples, discharge,or breast masses.   Review of Systems  Constitutional:   Negative for activity change, appetite change and fatigue.  HENT:  Negative for congestion and rhinorrhea.   Eyes:  Negative for pain.  Respiratory:  Negative for cough and shortness of breath.   Gastrointestinal:  Negative for anorexia, diarrhea and vomiting.  Genitourinary:  Negative for decreased urine volume and hematuria.  Musculoskeletal:  Negative for joint pain, joint swelling and myalgias.  Skin:  Positive for rash. Negative for nail changes.  Neurological:  Negative for weakness and numbness.  See other pertinent positives and negatives in HPI.  Current Outpatient Medications on File Prior to Visit  Medication Sig Dispense Refill   albuterol (VENTOLIN HFA) 108 (90 Base) MCG/ACT inhaler Inhale 2 puffs into the lungs every 4 (four) hours as needed for wheezing or shortness of breath. 8.5 each 2   cyanocobalamin (,VITAMIN B-12,) 1000 MCG/ML injection INJECT 1 ML IN THE DELTOID ONCE A WEEK FOR A MONTH. THEN INJECT 1 ML ONCE A MONTH THEREAFTER. 6 mL 1   fluticasone (FLONASE) 50 MCG/ACT nasal spray Place 2 sprays into both nostrils 2 (two) times daily.     levothyroxine (SYNTHROID) 175 MCG tablet TAKE 1 TABLET BY MOUTH EVERY DAY 90 tablet 0   allopurinol (ZYLOPRIM) 300 MG tablet Take 1 tablet (300 mg total) by mouth daily. (Patient not taking: Reported on 03/22/2023) 30 tablet 3   colchicine 0.6 MG tablet Take 1 tablet (0.6 mg total) by mouth daily as needed (gout or psuedogout pain). (Patient not taking: Reported on 03/22/2023) 30 tablet 2   cyclobenzaprine (FLEXERIL) 10 MG tablet Take 1 tablet (10 mg total) by  mouth 3 (three) times daily as needed for muscle spasms. (Patient not taking: Reported on 03/22/2023) 60 tablet 0   meloxicam (MOBIC) 15 MG tablet Take 1 tablet (15 mg total) by mouth daily. (Patient not taking: Reported on 03/22/2023) 30 tablet 0   mupirocin cream (BACTROBAN) 2 % Apply 1 application. topically 2 (two) times daily. (Patient not taking: Reported on 03/22/2023) 30 g 5    SYRINGE-NEEDLE, DISP, 3 ML (B-D INTEGRA SYRINGE) 23G X 1" 3 ML MISC Use for b12 injections (Patient not taking: Reported on 03/22/2023) 100 each 3   No current facility-administered medications on file prior to visit.    Past Medical History:  Diagnosis Date   Anxiety    Asthma    GERD (gastroesophageal reflux disease)    Sleep apnea    Allergies  Allergen Reactions   Amoxicillin Nausea And Vomiting   Montelukast Other (See Comments)    Causes forgetfulness     Social History   Socioeconomic History   Marital status: Married    Spouse name: Carlos Wheeler   Number of children: 2   Years of education: MBA   Highest education level: Not on file  Occupational History   Not on file  Tobacco Use   Smoking status: Never   Smokeless tobacco: Never  Vaping Use   Vaping status: Never Used  Substance and Sexual Activity   Alcohol use: Not Currently    Alcohol/week: 2.0 standard drinks of alcohol    Types: 2 Glasses of wine per week    Comment: quit drinking heavily 2 months ago   Drug use: No   Sexual activity: Yes    Birth control/protection: Surgical  Other Topics Concern   Not on file  Social History Narrative   09/30/18   Lives with wife and 2 children - twin boy (Carlos Wheeler) and girl (Carlos Wheeler) - born 2017   Enjoys: drag race, working on cars - trying to start a business   Work: Acupuncturist   Diet: eats good - grilled, avoids fried foods, carbs; does not eat very frequent   Exercise: does cardio every week   Social Determinants of Corporate investment banker Strain: Low Risk  (09/30/2018)   Overall Financial Resource Strain (CARDIA)    Difficulty of Paying Living Expenses: Not hard at all  Food Insecurity: Not on file  Transportation Needs: Not on file  Physical Activity: Not on file  Stress: Not on file  Social Connections: Not on file   Vitals:   03/22/23 1358  BP: 132/72  Pulse: 76  Resp: 12  Temp: 98.5 F (36.9 C)  SpO2: 96%   Body mass index is  38.37 kg/m.  Physical Exam Vitals and nursing note reviewed.  Constitutional:      General: He is not in acute distress.    Appearance: He is well-developed.  HENT:     Head: Normocephalic and atraumatic.     Mouth/Throat:     Mouth: Mucous membranes are moist.     Pharynx: Oropharynx is clear.  Eyes:     Conjunctiva/sclera: Conjunctivae normal.  Cardiovascular:     Rate and Rhythm: Normal rate and regular rhythm.     Heart sounds: No murmur heard. Pulmonary:     Effort: Pulmonary effort is normal. No respiratory distress.     Breath sounds: Normal breath sounds.  Lymphadenopathy:     Cervical: No cervical adenopathy.  Skin:    General: Skin is warm.     Findings: Rash  present. No erythema.          Comments: Erythematous confluent rash scattered on RUE, upper chest,and both nipples. More pronounce around nipples with lichenifying changes and right one with small excoriation. Not tender, no breast masses,and no induration.  Neurological:     General: No focal deficit present.     Mental Status: He is alert and oriented to person, place, and time.     Gait: Gait normal.  Psychiatric:        Mood and Affect: Mood and affect normal.   ASSESSMENT AND PLAN:  Mr. Stegeman was seen today for bilateral nipple rash.  Nipple dermatitis -     Clobetasol Propionate; Small amount on affected areas 2 times daily for 14 days then as needed.  Dispense: 30 g; Refill: 1  We discussed differential diagnosis. He has other similar lesions on other areas of his body. Reassured, history and examination do not suggest a serious process, so I do not think further workup is needed at this time. It seems to be an eczema exacerbation. Instructed to stop wearing plastic suit and to wear cotton shorts. Recommend topical clobetasol, small amount twice daily for up to 14 days then as needed. If he does not note great improvement in 2 to 3 weeks, instructed to let me know, so I can place referral  to dermatologist. Monitor for new symptoms. Avoid scratching areas, no signs of bacterial infection at this time.  I spent a total of 30 minutes in both face to face and non face to face activities for this visit on the date of this encounter. During this time history was obtained and documented, examination was performed, and assessment/plan discussed.  Return if symptoms worsen or fail to improve.   G. [place], MD  [facility]. [place] office.

## 2023-03-22 ENCOUNTER — Ambulatory Visit: Payer: BC Managed Care – PPO | Admitting: Family Medicine

## 2023-03-22 ENCOUNTER — Encounter: Payer: Self-pay | Admitting: Family Medicine

## 2023-03-22 VITALS — BP 132/72 | HR 76 | Temp 98.5°F | Resp 12 | Ht 73.0 in | Wt 290.8 lb

## 2023-03-22 DIAGNOSIS — L309 Dermatitis, unspecified: Secondary | ICD-10-CM

## 2023-03-22 MED ORDER — CLOBETASOL PROPIONATE 0.05 % EX CREA: TOPICAL_CREAM | CUTANEOUS | 1 refills | Status: DC

## 2023-03-22 NOTE — Patient Instructions (Addendum)
A few things to remember from today's visit:  Exacerbation of eczema - Plan: clobetasol cream (TEMOVATE) 0.05 % Avoid scratching areas, wear cotton shirts. Clobetasol , small smont 2 times daily for 14 days then prn. Please let me know if it does not improve, so we can place a referral to dermatologist.  Do not use My Chart to request refills or for acute issues that need immediate attention. If you send a my chart message, it may take a few days to be addressed, specially if I am not in the office.  Please be sure medication list is accurate. If a new problem present, please set up appointment sooner than planned today.

## 2023-03-23 DIAGNOSIS — G4733 Obstructive sleep apnea (adult) (pediatric): Secondary | ICD-10-CM | POA: Diagnosis not present

## 2023-03-26 ENCOUNTER — Other Ambulatory Visit: Payer: Self-pay | Admitting: Internal Medicine

## 2023-03-26 DIAGNOSIS — E038 Other specified hypothyroidism: Secondary | ICD-10-CM

## 2023-03-28 ENCOUNTER — Encounter (INDEPENDENT_AMBULATORY_CARE_PROVIDER_SITE_OTHER): Payer: Self-pay

## 2023-04-15 ENCOUNTER — Ambulatory Visit
Admission: EM | Admit: 2023-04-15 | Discharge: 2023-04-15 | Disposition: A | Discharge status: Home / Self Care | Payer: BC Managed Care – PPO | Attending: Emergency Medicine | Admitting: Emergency Medicine

## 2023-04-15 ENCOUNTER — Other Ambulatory Visit: Payer: Self-pay

## 2023-04-15 ENCOUNTER — Encounter: Payer: Self-pay | Admitting: Emergency Medicine

## 2023-04-15 DIAGNOSIS — L301 Dyshidrosis [pompholyx]: Secondary | ICD-10-CM

## 2023-04-15 MED ORDER — TRIAMCINOLONE ACETONIDE 0.1 % EX CREA: 1.0000 | TOPICAL_CREAM | Freq: Two times a day (BID) | CUTANEOUS | 0 refills | Status: DC

## 2023-04-15 NOTE — ED Triage Notes (Signed)
Pt here for rash.  Reports white bumps to left hand for 1 week and starting on right hand and right foot.  Itches in nature per pt.  Has tried OTC benadryl cream.  Had previous steroid cream that he put on the foot and helped a lot.  Reports like he when had poison oak before.  No known exposure to anything but did mow couple yards recently.

## 2023-04-15 NOTE — Discharge Instructions (Addendum)
Use the triamcinolone cream as directed.  Take Zyrtec daily at bedtime.  Follow-up with your primary care provider or a dermatologist.

## 2023-04-15 NOTE — ED Provider Notes (Signed)
[facility]    CSN: [id number] Arrival date & time: 04/15/23  1328      History   Chief Complaint Chief Complaint  Patient presents with   Rash    HPI Carlos Wheeler is a 42 y.o. male.  Patient has 1 week history of pruritic rash on his hands.  He has been treating it with Benadryl cream.  He recently had a rash on his foot and treated it with clobetasol cream but he is almost out of this.  He reports a lifelong history of eczema.  He denies fever, purulent drainage, or other symptoms.  His medical history also includes asthma, sleep apnea, CKD, hypothyroidism, GERD.  The history is provided by the patient and medical records.    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   Acute shoulder bursitis, left 09/29/2020   Vitamin D deficiency 09/09/2020   Vitamin B12 deficiency 09/09/2020   Hyperlipidemia 08/25/2019   Hypothyroidism 08/25/2019   IGT (impaired glucose tolerance) 08/25/2019   CKD (chronic kidney disease) stage 3, GFR 30-59 ml/min (HCC) 06/09/2019   Elevated LFTs 06/09/2019   Hand numbness 06/09/2019   Loss of taste 05/11/2019   History of heavy alcohol consumption 05/10/2019   Low back pain 04/24/2019   Elevated random blood glucose level 02/05/2019   Anxiety 09/30/2018   Obesity (BMI 30.0-34.9) 12/04/2016   GAD (generalized anxiety disorder) 12/04/2016   Obstructive sleep apnea of adult 08/02/2016    Past Surgical History:  Procedure Laterality Date   NO PAST SURGERIES         Home Medications    Prior to Admission medications   Medication Sig Start Date End Date Taking? Authorizing Provider  triamcinolone cream (KENALOG) 0.1 % Apply 1 Application topically 2 (two) times daily. 04/15/23  Yes Mickie Bail, NP  albuterol  (VENTOLIN HFA) 108 (90 Base) MCG/ACT inhaler Inhale 2 puffs into the lungs every 4 (four) hours as needed for wheezing or shortness of breath. 11/27/22   Nelwyn Salisbury, MD  allopurinol (ZYLOPRIM) 300 MG tablet Take 1 tablet (300 mg total) by mouth daily. Patient not taking: Reported on 03/22/2023 06/29/22   Rodolph Bong, MD  clobetasol cream (TEMOVATE) 0.05 % Small amount on affected areas 2 times daily for 14 days then as needed. 03/22/23   [place], Betty G, MD  colchicine 0.6 MG tablet Take 1 tablet (0.6 mg total) by mouth daily as needed (gout or psuedogout pain). Patient not taking: Reported on 03/22/2023 06/29/22   Rodolph Bong, MD  cyanocobalamin (,VITAMIN B-12,) 1000 MCG/ML injection INJECT 1 ML IN THE DELTOID ONCE A WEEK FOR A MONTH. THEN INJECT 1 ML ONCE A MONTH THEREAFTER. 08/01/21   Philip Aspen, Limmie Patricia, MD  cyclobenzaprine (FLEXERIL) 10 MG tablet Take 1 tablet (10 mg total) by mouth 3 (three) times daily as needed for muscle spasms. Patient not taking: Reported on 03/22/2023 11/21/21   Nelwyn Salisbury, MD  fluticasone [facility]) 50 MCG/ACT nasal spray Place 2 sprays into both nostrils 2 (two) times daily.    [provider]  levothyroxine (SYNTHROID) 175 MCG tablet TAKE 1 TABLET BY MOUTH EVERY DAY 03/26/23   Philip Aspen, Limmie Patricia, MD  meloxicam [facility]) 15  MG tablet Take 1 tablet (15 mg total) by mouth daily. Patient not taking: Reported on 03/22/2023 08/03/22   Richardean Sale, DO  mupirocin cream (BACTROBAN) 2 % Apply 1 application. topically 2 (two) times daily. Patient not taking: Reported on 03/22/2023 11/21/21   Nelwyn Salisbury, MD  SYRINGE-NEEDLE, DISP, 3 ML (B-D INTEGRA SYRINGE) 23G X 1" 3 ML MISC Use for b12 injections Patient not taking: Reported on 03/22/2023 02/28/23   Philip Aspen, Limmie Patricia, MD    Family History Family History  Problem Relation Age of Onset   Hyperlipidemia Mother    Hypertension Mother    Heart attack Father 70   Heart disease Father     Hypertension Father    Diabetes Sister    Hyperlipidemia Sister    Hypertension Sister    Diabetes Maternal Grandmother    Hypertension Maternal Grandmother    Diabetes Maternal Grandfather    Hypertension Maternal Grandfather    Heart attack Maternal Grandfather 68   Diabetes Paternal Grandmother    Stroke Paternal Grandmother 74   Diabetes Paternal Grandfather    Cancer Neg Hx     Social History Social History   Tobacco Use   Smoking status: Never   Smokeless tobacco: Never  Vaping Use   Vaping status: Never Used  Substance Use Topics   Alcohol use: Not Currently    Alcohol/week: 2.0 standard drinks of alcohol    Types: 2 Glasses of wine per week    Comment: quit drinking heavily 2 months ago   Drug use: No     Allergies   Amoxicillin and Montelukast   Review of Systems Review of Systems  Constitutional:  Negative for chills and fever.  Musculoskeletal:  Negative for arthralgias and joint swelling.  Skin:  Positive for rash. Negative for color change.  Neurological:  Negative for weakness and numbness.     Physical Exam Triage Vital Signs ED Triage Vitals  Encounter Vitals Group     BP      Systolic BP Percentile      Diastolic BP Percentile      Pulse      Resp      Temp      Temp src      SpO2      Weight      Height      Head Circumference      Peak Flow      Pain Score      Pain Loc      Pain Education      Exclude from Growth Chart    No data found.  Updated Vital Signs BP 132/80   Pulse 90   Temp 97.6 F (36.4 C)   Resp 18   Ht 6\' 2"  (1.88 m)   Wt 285 lb (129.3 kg)   SpO2 95%   BMI 36.59 kg/m   Visual Acuity Right Eye Distance:   Left Eye Distance:   Bilateral Distance:    Right Eye Near:   Left Eye Near:    Bilateral Near:     Physical Exam Vitals and nursing note reviewed.  Constitutional:      General: He is not in acute distress.    Appearance: He is well-developed.  HENT:     Mouth/Throat:     Mouth: Mucous  membranes are moist.  Cardiovascular:     Rate and Rhythm: Normal rate and regular rhythm.  Pulmonary:     Effort: Pulmonary effort is normal.  No respiratory distress.  Musculoskeletal:        General: No swelling or deformity. Normal range of motion.     Cervical back: Neck supple.  Skin:    General: Skin is warm and dry.     Capillary Refill: Capillary refill takes less than 2 seconds.     Findings: Rash present.     Comments: Flesh-colored papular rash on fingers, hands, and right wrist.  No purulent drainage.  Some areas have been scratched open.  Neurological:     Mental Status: He is alert.     Sensory: No sensory deficit.     Motor: No weakness.  Psychiatric:        Mood and Affect: Mood normal.        Behavior: Behavior normal.      UC Treatments / Results  Labs (all labs ordered are listed, but only abnormal results are displayed) Labs Reviewed - No data to display  EKG   Radiology No results found.  Procedures Procedures (including critical care time)  Medications Ordered in UC Medications - No data to display  Initial Impression / Assessment and Plan / UC Course  I have reviewed the triage vital signs and the nursing notes.  Pertinent labs & imaging results that were available during my care of the patient were reviewed by me and considered in my medical decision making (see chart for details).    Dyshidrotic eczema.  Treating with triamcinolone cream.  Discussed Zyrtec daily at bedtime.  Education provided on dyshidrotic eczema.  Instructed patient to follow-up with his PCP or dermatologist.  He agrees to plan of care.  Final Clinical Impressions(s) / UC Diagnoses   Final diagnoses:  Dyshidrotic eczema     Discharge Instructions      Use the triamcinolone cream as directed.  Take Zyrtec daily at bedtime.  Follow-up with your primary care provider or a dermatologist.     ED Prescriptions     Medication Sig Dispense Auth. Provider    triamcinolone cream (KENALOG) 0.1 % Apply 1 Application topically 2 (two) times daily. 30 g Mickie Bail, NP      PDMP not reviewed this encounter.   Mickie Bail, NP 04/15/23 1356

## 2023-04-18 ENCOUNTER — Encounter: Payer: Self-pay | Admitting: Internal Medicine

## 2023-04-18 ENCOUNTER — Ambulatory Visit (INDEPENDENT_AMBULATORY_CARE_PROVIDER_SITE_OTHER): Payer: BC Managed Care – PPO | Admitting: Internal Medicine

## 2023-04-18 VITALS — BP 120/80 | HR 73 | Temp 98.3°F | Ht 73.0 in | Wt 287.4 lb

## 2023-04-18 DIAGNOSIS — L309 Dermatitis, unspecified: Secondary | ICD-10-CM | POA: Diagnosis not present

## 2023-04-18 DIAGNOSIS — E538 Deficiency of other specified B group vitamins: Secondary | ICD-10-CM

## 2023-04-18 MED ORDER — CYANOCOBALAMIN 1000 MCG/ML IJ SOLN
1000.0000 ug | INTRAMUSCULAR | 1 refills | Status: DC
Start: 2023-04-18 — End: 2024-04-14

## 2023-04-18 MED ORDER — TRIAMCINOLONE ACETONIDE 0.1 % EX CREA: 1.0000 | TOPICAL_CREAM | Freq: Two times a day (BID) | CUTANEOUS | 0 refills | Status: DC

## 2023-04-18 MED ORDER — "BD INTEGRA SYRINGE 23G X 1"" 3 ML MISC": 3 refills | Status: DC

## 2023-04-18 NOTE — Progress Notes (Signed)
Established Patient Office Visit     CC/Reason for Visit: Discuss rash  HPI: Carlos Wheeler is a 42 y.o. male who is coming in today for the above mentioned reasons.  For a few weeks has developed a rash over his hands and in between his fingers.  He has a history of eczema.  Past Medical/Surgical History: Past Medical History:  Diagnosis Date   Anxiety    Asthma    GERD (gastroesophageal reflux disease)    Sleep apnea     Past Surgical History:  Procedure Laterality Date   NO PAST SURGERIES      Social History:  reports that he has never smoked. He has never used smokeless tobacco. He reports that he does not currently use alcohol after a past usage of about 2.0 standard drinks of alcohol per week. He reports that he does not use drugs.  Allergies: Allergies  Allergen Reactions   Amoxicillin Nausea And Vomiting   Montelukast Other (See Comments)    Causes forgetfulness     Family History:  Family History  Problem Relation Age of Onset   Hyperlipidemia Mother    Hypertension Mother    Heart attack Father 67   Heart disease Father    Hypertension Father    Diabetes Sister    Hyperlipidemia Sister    Hypertension Sister    Diabetes Maternal Grandmother    Hypertension Maternal Grandmother    Diabetes Maternal Grandfather    Hypertension Maternal Grandfather    Heart attack Maternal Grandfather 61   Diabetes Paternal Grandmother    Stroke Paternal Grandmother 67   Diabetes Paternal Grandfather    Cancer Neg Hx      Current Outpatient Medications:    albuterol (VENTOLIN HFA) 108 (90 Base) MCG/ACT inhaler, Inhale 2 puffs into the lungs every 4 (four) hours as needed for wheezing or shortness of breath., Disp: 8.5 each, Rfl: 2   clobetasol cream (TEMOVATE) 0.05 %, Small amount on affected areas 2 times daily for 14 days then as needed., Disp: 30 g, Rfl: 1   fluticasone (FLONASE) 50 MCG/ACT nasal spray, Place 2 sprays into both nostrils 2 (two)  times daily., Disp: , Rfl:    levothyroxine (SYNTHROID) 175 MCG tablet, TAKE 1 TABLET BY MOUTH EVERY DAY, Disp: 90 tablet, Rfl: 1   triamcinolone cream (KENALOG) 0.1 %, Apply 1 Application topically 2 (two) times daily., Disp: 453 g, Rfl: 0   allopurinol (ZYLOPRIM) 300 MG tablet, Take 1 tablet (300 mg total) by mouth daily. (Patient not taking: Reported on 03/22/2023), Disp: 30 tablet, Rfl: 3   colchicine 0.6 MG tablet, Take 1 tablet (0.6 mg total) by mouth daily as needed (gout or psuedogout pain). (Patient not taking: Reported on 03/22/2023), Disp: 30 tablet, Rfl: 2   cyanocobalamin (VITAMIN B12) 1000 MCG/ML injection, Inject 1 mL (1,000 mcg total) into the muscle every 30 (thirty) days. I, Disp: 6 mL, Rfl: 1   SYRINGE-NEEDLE, DISP, 3 ML (B-D INTEGRA SYRINGE) 23G X 1" 3 ML MISC, Use for b12 injections, Disp: 100 each, Rfl: 3  Review of Systems:  Negative unless indicated in HPI.   Physical Exam: Vitals:   04/18/23 1326  BP: 120/80  Pulse: 73  Temp: 98.3 F (36.8 C)  TempSrc: Oral  SpO2: 98%  Weight: 287 lb 6.4 oz (130.4 kg)  Height: 6\' 1"  (1.854 m)    Body mass index is 37.92 kg/m.   Physical Exam Skin:    Comments: Patches of  dry skin over hands and fingers, particularly over knuckles      Impression and Plan:  Eczema, unspecified type -     Triamcinolone Acetonide; Apply 1 Application topically 2 (two) times daily.  Dispense: 453 g; Refill: 0  Vitamin B12 deficiency -     Cyanocobalamin; Inject 1 mL (1,000 mcg total) into the muscle every 30 (thirty) days. I  Dispense: 6 mL; Refill: 1 -     BD Integra Syringe; Use for b12 injections  Dispense: 100 each; Refill: 3   -This is dyshidrotic eczema.  Advised to combine triamcinolone with Aquaphor healing ointment and copiously apply to hands at nighttime and cover with gloves.  Time spent:22 minutes reviewing chart, interviewing and examining patient and formulating plan of care.     Chaya Jan, MD [place]  [facility] at [facility]

## 2023-04-23 DIAGNOSIS — G4733 Obstructive sleep apnea (adult) (pediatric): Secondary | ICD-10-CM | POA: Diagnosis not present

## 2023-04-25 DIAGNOSIS — G4733 Obstructive sleep apnea (adult) (pediatric): Secondary | ICD-10-CM | POA: Diagnosis not present

## 2023-05-01 ENCOUNTER — Ambulatory Visit (INDEPENDENT_AMBULATORY_CARE_PROVIDER_SITE_OTHER): Payer: BC Managed Care – PPO | Admitting: Family Medicine

## 2023-05-01 ENCOUNTER — Encounter: Payer: Self-pay | Admitting: Family Medicine

## 2023-05-01 VITALS — BP 122/76 | HR 78 | Temp 98.4°F | Wt 289.0 lb

## 2023-05-01 DIAGNOSIS — L309 Dermatitis, unspecified: Secondary | ICD-10-CM

## 2023-05-01 DIAGNOSIS — L301 Dyshidrosis [pompholyx]: Secondary | ICD-10-CM | POA: Insufficient documentation

## 2023-05-01 MED ORDER — CLOBETASOL PROPIONATE 0.05 % EX CREA: TOPICAL_CREAM | Freq: Two times a day (BID) | CUTANEOUS | 3 refills | Status: DC

## 2023-05-01 NOTE — Progress Notes (Signed)
Subjective:    Patient ID: Carlos Wheeler, male    DOB: 06-17-1981, 42 y.o.   MRN: [id number]  HPI Here to follow up on dyshidrotic eczema on his hands and feet. He had been using Clobetasol cream with success until he ran out. He went to urgent care recently and wasgiven Triamcinolone cream instead. This did not work, so he saw Dr. Ardyth Harps, who told him to mix this with Aquaphor. This has not helped either.    Review of Systems  Constitutional: Negative.   Respiratory: Negative.    Cardiovascular: Negative.   Skin:  Positive for rash.       Objective:   Physical Exam Constitutional:      Appearance: Normal appearance.  Cardiovascular:     Rate and Rhythm: Normal rate and regular rhythm.     Pulses: Normal pulses.     Heart sounds: Normal heart sounds.  Pulmonary:     Effort: Pulmonary effort is normal.     Breath sounds: Normal breath sounds.  Skin:    Comments: There are areas of erythema and scaly cracked skin on the palms and soles   Neurological:     Mental Status: He is alert.           Assessment & Plan:  Dyshidrotic eczema. This requires a more potent steroid preparation, so we will get him back on Clobetasol cream BID as needed.  Gershon Crane, MD

## 2023-05-23 DIAGNOSIS — G4733 Obstructive sleep apnea (adult) (pediatric): Secondary | ICD-10-CM | POA: Diagnosis not present

## 2023-05-26 ENCOUNTER — Other Ambulatory Visit: Payer: Self-pay | Admitting: Internal Medicine

## 2023-05-26 DIAGNOSIS — L309 Dermatitis, unspecified: Secondary | ICD-10-CM

## 2023-06-23 DIAGNOSIS — G4733 Obstructive sleep apnea (adult) (pediatric): Secondary | ICD-10-CM | POA: Diagnosis not present

## 2023-07-08 ENCOUNTER — Other Ambulatory Visit: Payer: Self-pay | Admitting: Internal Medicine

## 2023-07-08 ENCOUNTER — Other Ambulatory Visit (INDEPENDENT_AMBULATORY_CARE_PROVIDER_SITE_OTHER): Payer: BC Managed Care – PPO

## 2023-07-08 ENCOUNTER — Other Ambulatory Visit: Payer: Self-pay | Admitting: Emergency Medicine

## 2023-07-08 DIAGNOSIS — E785 Hyperlipidemia, unspecified: Secondary | ICD-10-CM

## 2023-07-08 DIAGNOSIS — E559 Vitamin D deficiency, unspecified: Secondary | ICD-10-CM

## 2023-07-08 DIAGNOSIS — E038 Other specified hypothyroidism: Secondary | ICD-10-CM

## 2023-07-08 DIAGNOSIS — E782 Mixed hyperlipidemia: Secondary | ICD-10-CM

## 2023-07-09 LAB — TSH: TSH: 4.1 u[IU]/mL (ref 0.35–5.50)

## 2023-07-09 LAB — LIPID PANEL
Cholesterol: 232 mg/dL — ABNORMAL HIGH (ref 0–200)
HDL: 68.3 mg/dL (ref >39.00)
LDL Cholesterol: 136 mg/dL — ABNORMAL HIGH (ref 0–99)
NonHDL: 164.06
Total CHOL/HDL Ratio: 3
Triglycerides: 139 mg/dL (ref 0.0–149.0)
VLDL: 27.8 mg/dL (ref 0.0–40.0)

## 2023-07-09 LAB — VITAMIN D 25 HYDROXY (VIT D DEFICIENCY, FRACTURES): VITD: 32.97 ng/mL (ref 30.00–100.00)

## 2023-07-16 ENCOUNTER — Encounter: Payer: Self-pay | Admitting: Internal Medicine

## 2023-07-16 DIAGNOSIS — E785 Hyperlipidemia, unspecified: Secondary | ICD-10-CM

## 2023-07-16 MED ORDER — ATORVASTATIN CALCIUM 20 MG PO TABS: 20.0000 mg | ORAL_TABLET | Freq: Every day | ORAL | 1 refills | Status: DC

## 2023-07-18 ENCOUNTER — Telehealth: Payer: Self-pay | Admitting: Internal Medicine

## 2023-07-18 DIAGNOSIS — L301 Dyshidrosis [pompholyx]: Secondary | ICD-10-CM

## 2023-07-18 NOTE — Telephone Encounter (Signed)
Pt is calling and would like a referral to dermatologist for eczema on back. Pt has Carlos Wheeler

## 2023-07-18 NOTE — Telephone Encounter (Signed)
Referral placed.

## 2023-07-22 NOTE — Telephone Encounter (Signed)
Pt is aware referral has been place

## 2023-07-23 ENCOUNTER — Ambulatory Visit: Payer: BC Managed Care – PPO | Admitting: Internal Medicine

## 2023-07-24 ENCOUNTER — Encounter: Payer: Self-pay | Admitting: Internal Medicine

## 2023-07-24 ENCOUNTER — Ambulatory Visit (INDEPENDENT_AMBULATORY_CARE_PROVIDER_SITE_OTHER): Payer: BC Managed Care – PPO | Admitting: Internal Medicine

## 2023-07-24 VITALS — BP 124/84 | HR 73 | Temp 98.4°F | Wt 301.3 lb

## 2023-07-24 DIAGNOSIS — L309 Dermatitis, unspecified: Secondary | ICD-10-CM | POA: Diagnosis not present

## 2023-07-24 DIAGNOSIS — L301 Dyshidrosis [pompholyx]: Secondary | ICD-10-CM

## 2023-07-24 MED ORDER — TRIAMCINOLONE ACETONIDE 0.1 % EX CREA: TOPICAL_CREAM | Freq: Two times a day (BID) | CUTANEOUS | 2 refills | Status: DC

## 2023-07-24 NOTE — Progress Notes (Signed)
Established Patient Office Visit     CC/Reason for Visit: Rash  HPI: Carlos Wheeler is a 42 y.o. male who is coming in today for the above mentioned reasons.  He has been experiencing this since the season change.  Same happened last year.  It is mainly on his hands and feet but also has scattered areas over his body.  It is dry, scaly skin with some patches of redness, very itchy.   Past Medical/Surgical History: Past Medical History:  Diagnosis Date   Anxiety    Asthma    GERD (gastroesophageal reflux disease)    Sleep apnea     Past Surgical History:  Procedure Laterality Date   NO PAST SURGERIES      Social History:  reports that he has never smoked. He has never used smokeless tobacco. He reports that he does not currently use alcohol after a past usage of about 2.0 standard drinks of alcohol per week. He reports that he does not use drugs.  Allergies: Allergies  Allergen Reactions   Amoxicillin Nausea And Vomiting   Montelukast Other (See Comments)    Causes forgetfulness     Family History:  Family History  Problem Relation Age of Onset   Hyperlipidemia Mother    Hypertension Mother    Heart attack Father 104   Heart disease Father    Hypertension Father    Diabetes Sister    Hyperlipidemia Sister    Hypertension Sister    Diabetes Maternal Grandmother    Hypertension Maternal Grandmother    Diabetes Maternal Grandfather    Hypertension Maternal Grandfather    Heart attack Maternal Grandfather 62   Diabetes Paternal Grandmother    Stroke Paternal Grandmother 57   Diabetes Paternal Grandfather    Cancer Neg Hx      Current Outpatient Medications:    albuterol (VENTOLIN HFA) 108 (90 Base) MCG/ACT inhaler, Inhale 2 puffs into the lungs every 4 (four) hours as needed for wheezing or shortness of breath., Disp: 8.5 each, Rfl: 2   allopurinol (ZYLOPRIM) 300 MG tablet, Take 1 tablet (300 mg total) by mouth daily., Disp: 30 tablet, Rfl: 3    atorvastatin (LIPITOR) 20 MG tablet, Take 1 tablet (20 mg total) by mouth daily., Disp: 90 tablet, Rfl: 1   clobetasol cream (TEMOVATE) 0.05 %, Apply topically 2 (two) times daily., Disp: 60 g, Rfl: 3   colchicine 0.6 MG tablet, Take 1 tablet (0.6 mg total) by mouth daily as needed (gout or psuedogout pain)., Disp: 30 tablet, Rfl: 2   cyanocobalamin (VITAMIN B12) 1000 MCG/ML injection, Inject 1 mL (1,000 mcg total) into the muscle every 30 (thirty) days. I, Disp: 6 mL, Rfl: 1   fluticasone (FLONASE) 50 MCG/ACT nasal spray, Place 2 sprays into both nostrils 2 (two) times daily., Disp: , Rfl:    levothyroxine (SYNTHROID) 175 MCG tablet, TAKE 1 TABLET BY MOUTH EVERY DAY, Disp: 90 tablet, Rfl: 1   SYRINGE-NEEDLE, DISP, 3 ML (B-D INTEGRA SYRINGE) 23G X 1" 3 ML MISC, Use for b12 injections, Disp: 100 each, Rfl: 3   triamcinolone cream (KENALOG) 0.1 %, Apply topically 2 (two) times daily., Disp: 454 g, Rfl: 2  Review of Systems:  Negative unless indicated in HPI.   Physical Exam: Vitals:   07/24/23 0741  BP: 124/84  Pulse: 73  Temp: 98.4 F (36.9 C)  TempSrc: Oral  SpO2: 98%  Weight: (!) 301 lb 4.8 oz (136.7 kg)    Body mass  index is 39.75 kg/m.   Physical Exam Vitals reviewed.  Constitutional:      Appearance: Normal appearance.  HENT:     Head: Normocephalic and atraumatic.  Eyes:     Conjunctiva/sclera: Conjunctivae normal.  Skin:    General: Skin is warm and dry.     Comments: Areas of eczema noted on hands, feet and upper extremities  Neurological:     General: No focal deficit present.     Mental Status: He is alert and oriented to person, place, and time.  Psychiatric:        Mood and Affect: Mood normal.        Behavior: Behavior normal.        Thought Content: Thought content normal.        Judgment: Judgment normal.      Impression and Plan:  Dyshidrotic eczema  Eczema, unspecified type -     Triamcinolone Acetonide; Apply topically 2 (two) times daily.   Dispense: 454 g; Refill: 2  -Classic dyshidrotic eczema.  Advised daily use of triamcinolone and Aquaphor healing ointment covered by gloves and mittens to sleep.   Time spent:31 minutes reviewing chart, interviewing and examining patient and formulating plan of care.     Chaya Jan, MD [facility]

## 2023-09-18 DIAGNOSIS — L2089 Other atopic dermatitis: Secondary | ICD-10-CM | POA: Diagnosis not present

## 2023-09-19 ENCOUNTER — Other Ambulatory Visit: Payer: Self-pay | Admitting: Internal Medicine

## 2023-09-19 ENCOUNTER — Ambulatory Visit: Payer: Self-pay | Admitting: Internal Medicine

## 2023-09-19 DIAGNOSIS — E038 Other specified hypothyroidism: Secondary | ICD-10-CM

## 2023-09-19 NOTE — Telephone Encounter (Signed)
  Chief Complaint: Cough Symptoms: cough, sore throat, wheezing, runny nose, congestion Frequency: 1 week Pertinent Negatives: Patient denies SOB with activity, fever, NVD Disposition: [] ED /[x] Urgent Care (no appt availability in office) / [] Appointment(In office/virtual)/ []  [facility]/ [] Home Care/ [] Refused Recommended Disposition /[] [facility]/ []  Follow-up with PCP Additional Notes: Patient calls reporting cough, congestion, runny nose, wheezing, sore throat x 1 week. During triage call, patient is in a meeting and reluctant to answer questions or review alternative scheduling options. Per protocol, patient to be evaluated within 4 hours. No availability in clinic within time frame. Patient states he will go to Urgent Care. Call disconnected by patient prior to providing care advice. Alerting PCP for review.   Copied from CRM [id number]. Topic: Clinical - Pink Word Triage >> Sep 19, 2023 12:21 PM Tiffany H wrote: PINK WORD: SWELLING. Patient called to advise that he had a sinus infection last week that was going away, but symptoms are returning. Patient advised that he has a sore, scratchy throat, coughing, congestion in chest and runny nose.   No fever.   First available appointment is 09/23/23. Reason for Disposition  Wheezing is present  Answer Assessment - Initial Assessment Questions 1. ONSET: When did the cough begin?      1 week 2. SEVERITY: How bad is the cough today?      Moderate 3. SPUTUM: Describe the color of your sputum (none, dry cough; clear, white, yellow, green)     Green, brown 4. HEMOPTYSIS: Are you coughing up any blood? If so ask: How much? (flecks, streaks, tablespoons, etc.)     Says one time, real light streak 4 days ago. 5. DIFFICULTY BREATHING: Are you having difficulty breathing? If Yes, ask: How bad is it? (e.g., mild, moderate, severe)    - MILD: No SOB at rest, mild SOB with walking, speaks normally in sentences, can  lie down, no retractions, pulse < 100.    - MODERATE: SOB at rest, SOB with minimal exertion and prefers to sit, cannot lie down flat, speaks in phrases, mild retractions, audible wheezing, pulse 100-120.    - SEVERE: Very SOB at rest, speaks in single words, struggling to breathe, sitting hunched forward, retractions, pulse > 120      Mild 6. FEVER: Do you have a fever? If Yes, ask: What is your temperature, how was it measured, and when did it start?     Denies 7. CARDIAC HISTORY: Do you have any history of heart disease? (e.g., heart attack, congestive heart failure)      Denies 8. LUNG HISTORY: Do you have any history of lung disease?  (e.g., pulmonary embolus, asthma, emphysema)     Asthma 9. PE RISK FACTORS: Do you have a history of blood clots? (or: recent major surgery, recent prolonged travel, bedridden)     Denies 10. OTHER SYMPTOMS: Do you have any other symptoms? (e.g., runny nose, wheezing, chest pain)       Congestion, sore throat, body aches, wheezing, runny nose  12. TRAVEL: Have you traveled out of the country in the last month? (e.g., travel history, exposures)       Denies  Protocols used: Cough - Acute Productive-A-AH

## 2023-09-22 ENCOUNTER — Ambulatory Visit
Admission: RE | Admit: 2023-09-22 | Discharge: 2023-09-22 | Disposition: A | Discharge status: Home / Self Care | Payer: BC Managed Care – PPO | Source: Ambulatory Visit | Attending: Emergency Medicine | Admitting: Emergency Medicine

## 2023-09-22 VITALS — BP 127/76 | HR 90 | Temp 98.7°F | Resp 18

## 2023-09-22 DIAGNOSIS — J4521 Mild intermittent asthma with (acute) exacerbation: Secondary | ICD-10-CM | POA: Diagnosis not present

## 2023-09-22 DIAGNOSIS — J069 Acute upper respiratory infection, unspecified: Secondary | ICD-10-CM

## 2023-09-22 MED ORDER — ALBUTEROL SULFATE HFA 108 (90 BASE) MCG/ACT IN AERS: 1.0000 | INHALATION_SPRAY | Freq: Four times a day (QID) | RESPIRATORY_TRACT | 0 refills | Status: DC | PRN

## 2023-09-22 MED ORDER — AZITHROMYCIN 250 MG PO TABS: 250.0000 mg | ORAL_TABLET | Freq: Every day | ORAL | 0 refills | Status: DC

## 2023-09-22 MED ORDER — PREDNISONE 10 MG PO TABS: 40.0000 mg | ORAL_TABLET | Freq: Every day | ORAL | 0 refills | Status: AC

## 2023-09-22 NOTE — Discharge Instructions (Addendum)
Use the albuterol inhaler as directed.  Take the prednisone and Zithromax as directed.  Follow up with your primary care provider tomorrow.    

## 2023-09-22 NOTE — ED Triage Notes (Addendum)
 Patient to Urgent Care with complaints of cough/ nasal congestion/ sinus pain and pressure/ fevers/ wheezing.  Symptoms started 10 days ago.   Meds: taking mucinex-D.

## 2023-09-22 NOTE — ED Provider Notes (Signed)
 [facility]    CSN: [id number] Arrival date & time: 09/22/23  1052      History   Chief Complaint Chief Complaint  Patient presents with   Cough    HPI Carlos Wheeler is a 43 y.o. male.  Patient presents with 10-day history of congestion, cough, wheezing.  He has been taking Mucinex D.  He thinks he may have had a fever at the onset of his symptoms but none since.  He denies shortness of breath, vomiting, diarrhea.  His medical history includes asthma.  He has been using his albuterol  inhaler but states he needs a refill.  The history is provided by the patient and medical records.    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   Acute shoulder bursitis, left 09/29/2020   Vitamin D  deficiency 09/09/2020   Vitamin B12 deficiency 09/09/2020   Hyperlipidemia 08/25/2019   Hypothyroidism 08/25/2019   IGT (impaired glucose tolerance) 08/25/2019   CKD (chronic kidney disease) stage 3, GFR 30-59 ml/min (HCC) 06/09/2019   Elevated LFTs 06/09/2019   Hand numbness 06/09/2019   Loss of taste 05/11/2019   History of heavy alcohol consumption 05/10/2019   Low back pain 04/24/2019   Elevated random blood glucose level 02/05/2019   Anxiety 09/30/2018   Obesity (BMI 30.0-34.9) 12/04/2016   GAD (generalized anxiety disorder) 12/04/2016   Obstructive sleep apnea of adult 08/02/2016    Past Surgical History:  Procedure Laterality Date   NO PAST SURGERIES         Home Medications    Prior to Admission medications   Medication Sig Start Date End Date Taking? Authorizing Provider  albuterol  (VENTOLIN  HFA) 108 (90 Base) MCG/ACT inhaler Inhale 1-2 puffs into the lungs every 6 (six) hours as needed. 09/22/23  Yes Corlis Burnard DEL, NP   azithromycin  (ZITHROMAX ) 250 MG tablet Take 1 tablet (250 mg total) by mouth daily. Take first 2 tablets together, then 1 every day until finished. 09/22/23  Yes Corlis Burnard DEL, NP  predniSONE  (DELTASONE ) 10 MG tablet Take 4 tablets (40 mg total) by mouth daily for 5 days. 09/22/23 09/27/23 Yes Corlis Burnard DEL, NP  albuterol  (VENTOLIN  HFA) 108 (90 Base) MCG/ACT inhaler Inhale 2 puffs into the lungs every 4 (four) hours as needed for wheezing or shortness of breath. 11/27/22   Johnny Garnette LABOR, MD  allopurinol  (ZYLOPRIM ) 300 MG tablet Take 1 tablet (300 mg total) by mouth daily. 06/29/22   Joane Artist RAMAN, MD  atorvastatin  (LIPITOR) 20 MG tablet Take 1 tablet (20 mg total) by mouth daily. 07/16/23   Theophilus Andrews, Tully GRADE, MD  clobetasol  cream (TEMOVATE ) 0.05 % Apply topically 2 (two) times daily. 05/01/23   Johnny Garnette LABOR, MD  cyanocobalamin  (VITAMIN B12) 1000 MCG/ML injection Inject 1 mL (1,000 mcg total) into the muscle every 30 (thirty) days. I 04/18/23   Theophilus Andrews, Tully GRADE, MD  fluticasone  (FLONASE ) 50 MCG/ACT nasal spray Place 2 sprays into both nostrils 2 (two) times daily.    [provider]  levothyroxine  (SYNTHROID ) 175 MCG tablet TAKE 1 TABLET BY MOUTH EVERY DAY 09/19/23   Theophilus Andrews, Tully GRADE, MD  SYRINGE-NEEDLE, DISP, 3 ML (B-D INTEGRA SYRINGE) 23G  X 1 3 ML MISC Use for b12 injections 04/18/23   Theophilus Andrews, Tully GRADE, MD  triamcinolone  cream (KENALOG ) 0.1 % Apply topically 2 (two) times daily. 07/24/23   Theophilus Andrews, Tully GRADE, MD    Family History Family History  Problem Relation Age of Onset   Hyperlipidemia Mother    Hypertension Mother    Heart attack Father 73   Heart disease Father    Hypertension Father    Diabetes Sister    Hyperlipidemia Sister    Hypertension Sister    Diabetes Maternal Grandmother    Hypertension Maternal Grandmother    Diabetes Maternal Grandfather    Hypertension Maternal Grandfather    Heart attack Maternal Grandfather 1    Diabetes Paternal Grandmother    Stroke Paternal Grandmother 21   Diabetes Paternal Grandfather    Cancer Neg Hx     Social History Social History   Tobacco Use   Smoking status: Never   Smokeless tobacco: Never  Vaping Use   Vaping status: Never Used  Substance Use Topics   Alcohol use: Not Currently    Alcohol/week: 2.0 standard drinks of alcohol    Types: 2 Glasses of wine per week    Comment: quit drinking heavily 2 months ago   Drug use: No     Allergies   Amoxicillin and Montelukast   Review of Systems Review of Systems  Constitutional:  Negative for chills and fever.  HENT:  Positive for congestion, postnasal drip and sinus pressure. Negative for ear pain and sore throat.   Respiratory:  Positive for cough and wheezing. Negative for shortness of breath.   Gastrointestinal:  Negative for diarrhea and vomiting.     Physical Exam Triage Vital Signs ED Triage Vitals  Encounter Vitals Group     BP 09/22/23 1107 127/76     Systolic BP Percentile --      Diastolic BP Percentile --      Pulse Rate 09/22/23 1058 90     Resp 09/22/23 1058 18     Temp 09/22/23 1058 98.7 F (37.1 C)     Temp src --      SpO2 09/22/23 1058 95 %     Weight --      Height --      Head Circumference --      Peak Flow --      Pain Score 09/22/23 1105 2     Pain Loc --      Pain Education --      Exclude from Growth Chart --    No data found.  Updated Vital Signs BP 127/76   Pulse 90   Temp 98.7 F (37.1 C)   Resp 18   SpO2 95%   Visual Acuity Right Eye Distance:   Left Eye Distance:   Bilateral Distance:    Right Eye Near:   Left Eye Near:    Bilateral Near:     Physical Exam Constitutional:      General: He is not in acute distress. HENT:     Right Ear: Tympanic membrane normal.     Left Ear: Tympanic membrane normal.     Nose: Congestion and rhinorrhea present.     Mouth/Throat:     Mouth: Mucous membranes are moist.     Pharynx: Oropharynx is clear.   Cardiovascular:     Rate and Rhythm: Normal rate and regular rhythm.     Heart sounds: Normal heart sounds.  Pulmonary:  Effort: Pulmonary effort is normal. No respiratory distress.     Breath sounds: Wheezing present.     Comments: Expiratory wheezes Neurological:     Mental Status: He is alert.      UC Treatments / Results  Labs (all labs ordered are listed, but only abnormal results are displayed) Labs Reviewed - No data to display  EKG   Radiology No results found.  Procedures Procedures (including critical care time)  Medications Ordered in UC Medications - No data to display  Initial Impression / Assessment and Plan / UC Course  I have reviewed the triage vital signs and the nursing notes.  Pertinent labs & imaging results that were available during my care of the patient were reviewed by me and considered in my medical decision making (see chart for details).    Asthma exacerbation, acute URI.  O2 sat 95% on room air.  No respiratory distress.  Treating with albuterol  inhaler, prednisone , Zithromax .  Instructed patient to follow-up with his PCP tomorrow.  ED precautions given.  Education provided on asthma and URI.  He agrees to plan of care.  Final Clinical Impressions(s) / UC Diagnoses   Final diagnoses:  Mild intermittent asthma with acute exacerbation  Acute upper respiratory infection     Discharge Instructions      Use the albuterol  inhaler as directed.  Take the prednisone  and Zithromax  as directed.  Follow-up with your primary care provider tomorrow.     ED Prescriptions     Medication Sig Dispense Auth. Provider   albuterol  (VENTOLIN  HFA) 108 (90 Base) MCG/ACT inhaler Inhale 1-2 puffs into the lungs every 6 (six) hours as needed. 18 g Corlis Burnard DEL, NP   predniSONE  (DELTASONE ) 10 MG tablet Take 4 tablets (40 mg total) by mouth daily for 5 days. 20 tablet Corlis Burnard DEL, NP   azithromycin  (ZITHROMAX ) 250 MG tablet Take 1 tablet (250 mg  total) by mouth daily. Take first 2 tablets together, then 1 every day until finished. 6 tablet Corlis Burnard DEL, NP      PDMP not reviewed this encounter.   Corlis Burnard DEL, NP 09/22/23 1125

## 2023-10-23 ENCOUNTER — Other Ambulatory Visit: Payer: Self-pay | Admitting: Family Medicine

## 2023-10-23 DIAGNOSIS — L309 Dermatitis, unspecified: Secondary | ICD-10-CM

## 2023-11-02 DIAGNOSIS — G4733 Obstructive sleep apnea (adult) (pediatric): Secondary | ICD-10-CM | POA: Diagnosis not present

## 2023-11-20 ENCOUNTER — Ambulatory Visit: Admitting: Internal Medicine

## 2023-12-03 DIAGNOSIS — G4733 Obstructive sleep apnea (adult) (pediatric): Secondary | ICD-10-CM | POA: Diagnosis not present

## 2023-12-04 ENCOUNTER — Ambulatory Visit: Payer: Self-pay

## 2023-12-04 NOTE — Telephone Encounter (Signed)
 Copied from CRM [id number]. Topic: Clinical - Red Word Triage >> Dec 04, 2023  8:32 AM Juluis Ok wrote: [facility] that prompted transfer to Nurse Triage: Asthma, chest congestion, wheezing  Chief Complaint: sinus congestion Symptoms: nasal & chest congestion, cough -productive with yellow sputum, wheezing Frequency: Sunday Pertinent Negatives: Patient denies fever, SOB Disposition: [] ED /[] Urgent Care (no appt availability in office) / [x] Appointment(In office/virtual)/ []  [facility]/ [] Home Care/ [] Refused Recommended Disposition /[] [facility]/ []  Follow-up with PCP Additional Notes: pt is currently using rescue inhaler , Flonase , muccinex without relief: pt would like to be evaluated & tx  Reason for Disposition  [1] Sinus congestion (pressure, fullness) AND [2] present > 10 days  Answer Assessment - Initial Assessment Questions 1. LOCATION: "Where does it hurt?"      Head pressure 2. ONSET: "When did the sinus pain start?"  (e.g., hours, days)      Sunday 3. SEVERITY: "How bad is the pain?"   (Scale 1-10; mild, moderate or severe)   - MILD (1-3): doesn't interfere with normal activities    - MODERATE (4-7): interferes with normal activities (e.g., work or school) or awakens from sleep   - SEVERE (8-10): excruciating pain and patient unable to do any normal activities        mild 4. RECURRENT SYMPTOM: "Have you ever had sinus problems before?" If Yes, ask: "When was the last time?" and "What happened that time?"      yes 5. NASAL CONGESTION: "Is the nose blocked?" If Yes, ask: "Can you open it or must you breathe through your mouth?"     Nasal & congestion, 6. NASAL DISCHARGE: "Do you have discharge from your nose?" If so ask, "What color?"     yellow 7. FEVER: "Do you have a fever?" If Yes, ask: "What is it, how was it measured, and when did it start?"      no 8. OTHER SYMPTOMS: "Do you have any other symptoms?" (e.g., sore throat, cough, earache, difficulty  breathing)     Cough- productve yellow, wheezing, nasal  chest congestion 9. PREGNANCY: "Is there any chance you are pregnant?" "When was your last menstrual period?"     N/a  Protocols used: Sinus Pain or Congestion-A-AH

## 2023-12-04 NOTE — Telephone Encounter (Signed)
 Patient was scheduled for an appt on 4/24 with Dr Arliss Lam.

## 2023-12-05 ENCOUNTER — Encounter: Payer: Self-pay | Admitting: Family Medicine

## 2023-12-05 ENCOUNTER — Ambulatory Visit: Admitting: Family Medicine

## 2023-12-05 VITALS — BP 126/86 | HR 80 | Temp 98.3°F | Ht 73.0 in | Wt 290.4 lb

## 2023-12-05 DIAGNOSIS — J069 Acute upper respiratory infection, unspecified: Secondary | ICD-10-CM

## 2023-12-05 DIAGNOSIS — H6502 Acute serous otitis media, left ear: Secondary | ICD-10-CM | POA: Diagnosis not present

## 2023-12-05 DIAGNOSIS — R051 Acute cough: Secondary | ICD-10-CM

## 2023-12-05 DIAGNOSIS — R062 Wheezing: Secondary | ICD-10-CM

## 2023-12-05 DIAGNOSIS — R0982 Postnasal drip: Secondary | ICD-10-CM

## 2023-12-05 LAB — POC COVID19 BINAXNOW: SARS Coronavirus 2 Ag: NEGATIVE

## 2023-12-05 MED ORDER — PREDNISONE 20 MG PO TABS: 40.0000 mg | ORAL_TABLET | Freq: Every day | ORAL | 0 refills | Status: AC

## 2023-12-05 MED ORDER — AZITHROMYCIN 250 MG PO TABS: ORAL_TABLET | ORAL | 0 refills | Status: AC

## 2023-12-05 MED ORDER — IPRATROPIUM-ALBUTEROL 0.5-2.5 (3) MG/3ML IN SOLN
3.0000 mL | Freq: Once | RESPIRATORY_TRACT | Status: AC
  Administered 2023-12-05: 3 mL via RESPIRATORY_TRACT

## 2023-12-05 NOTE — Progress Notes (Signed)
 Established Patient Office Visit   Subjective  Patient ID: Carlos Wheeler, male    DOB: [birth year]  Age: 43 y.o. MRN: [id number]  Chief Complaint  Patient presents with   Sinus Problem    Mucus, congestion, productive cough, wheezing started 4 days,     Patient is a 43 year old male followed by Dr. Ival Marines and seen for acute concern.  Patient with nasal congestion, productive cough, wheezing starting 4 days ago.  Throat was sore for 1 day symptoms started.  Cough now dry and worse at night.  Taking Zyrtec-D which is not helping.  Allergies/sinus issues.  Patient feels like symptoms have improved by now.  Headache, fever, ear pain/pressure, facial pain/pressure, sick contacts.  No history of asthma.    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   Acute shoulder bursitis, left 09/29/2020   Vitamin D  deficiency 09/09/2020   Vitamin B12 deficiency 09/09/2020   Hyperlipidemia 08/25/2019   Hypothyroidism 08/25/2019   IGT (impaired glucose tolerance) 08/25/2019   CKD (chronic kidney disease) stage 3, GFR 30-59 ml/min (HCC) 06/09/2019   Elevated LFTs 06/09/2019   Hand numbness 06/09/2019   Loss of taste 05/11/2019   History of heavy alcohol consumption 05/10/2019   Low back pain 04/24/2019   Elevated random blood glucose level 02/05/2019   Anxiety 09/30/2018   Obesity (BMI 30.0-34.9) 12/04/2016   GAD (generalized anxiety disorder) 12/04/2016   Obstructive sleep apnea of adult 08/02/2016   Past Medical History:  Diagnosis Date   Anxiety    Asthma    GERD (gastroesophageal reflux disease)    Sleep apnea    Past Surgical History:  Procedure Laterality Date   NO PAST SURGERIES     Social History   Tobacco Use   Smoking status: Never   Smokeless tobacco: Never  Vaping Use   Vaping status: Never Used  Substance Use Topics   Alcohol use: Not  Currently    Alcohol/week: 2.0 standard drinks of alcohol    Types: 2 Glasses of wine per week    Comment: quit drinking heavily 2 months ago   Drug use: No   Family History  Problem Relation Age of Onset   Hyperlipidemia Mother    Hypertension Mother    Heart attack Father 61   Heart disease Father    Hypertension Father    Diabetes Sister    Hyperlipidemia Sister    Hypertension Sister    Diabetes Maternal Grandmother    Hypertension Maternal Grandmother    Diabetes Maternal Grandfather    Hypertension Maternal Grandfather    Heart attack Maternal Grandfather 11   Diabetes Paternal Grandmother    Stroke Paternal Grandmother 15   Diabetes Paternal Grandfather    Cancer Neg Hx    Allergies  Allergen Reactions   Amoxicillin Nausea And Vomiting   Montelukast Other (See Comments)    Causes forgetfulness       ROS Negative unless stated above    Objective:     BP 126/86 (BP Location: Left Arm, Patient Position: Sitting, Cuff Size: Normal)   Pulse 80   Temp 98.3 F (36.8 C) (Oral)   Ht 6\' 1"  (1.854 m)   Wt 290 lb 6.4 oz (131.7 kg)   SpO2 96%   BMI 38.31 kg/m  BP Readings from Last 3 Encounters:  12/05/23 126/86  09/22/23 127/76  07/24/23 124/84  Wt Readings from Last 3 Encounters:  12/05/23 290 lb 6.4 oz (131.7 kg)  07/24/23 (!) 301 lb 4.8 oz (136.7 kg)  05/01/23 289 lb (131.1 kg)    Physical Exam Constitutional:      General: He is not in acute distress.    Appearance: Normal appearance.  HENT:     Head: Normocephalic and atraumatic.     Right Ear: Hearing, ear canal and external ear normal. Tympanic membrane is erythematous.     Left Ear: Hearing, ear canal and external ear normal. Tympanic membrane is erythematous.     Ears:     Comments: Left TM full, erythema, mild suppurative fluid in superior TM.    Nose: Nose normal.     Mouth/Throat:     Mouth: Mucous membranes are moist.  Cardiovascular:     Rate and Rhythm: Normal rate and regular  rhythm.     Heart sounds: Normal heart sounds. No murmur heard.    No gallop.  Pulmonary:     Effort: Pulmonary effort is normal. No respiratory distress.     Breath sounds: Wheezing present. No rhonchi or rales.  Skin:    General: Skin is warm and dry.  Neurological:     Mental Status: He is alert and oriented to person, place, and time.      Results for orders placed or performed in visit on 12/05/23  POC COVID-19 BinaxNow  Result Value Ref Range   SARS Coronavirus 2 Ag Negative Negative      Assessment & Plan:  Acute serous otitis media of left ear, recurrence not specified -     Azithromycin ; Take 2 tablets on day 1, then 1 tablet daily on days 2 through 5  Dispense: 6 tablet; Refill: 0  Acute cough -     POC COVID-19 BinaxNow  Wheezing -     Ipratropium-Albuterol  -     predniSONE ; Take 2 tablets (40 mg total) by mouth daily with breakfast for 3 days.  Dispense: 6 tablet; Refill: 0  Viral URI  Post-nasal drainage  Acute viral URI symptoms with cough and left AOM.  POC COVID testing negative.  Start ABX, azithromycin .  Intolerance to amoxicillin.  Prednisone  burst for wheezing.  Given DuoNeb in clinic.  Continue supportive care with OTC cough/cold medications.  Given precautions.  Follow-up with PCP as needed.  Return if symptoms worsen or fail to improve.   Viola Greulich, MD

## 2023-12-06 ENCOUNTER — Encounter: Payer: Self-pay | Admitting: Family Medicine

## 2023-12-06 NOTE — Telephone Encounter (Signed)
 Called patient and left a message to inquire why the patient can't take prednisone , and Dr. Arliss Lam was aware the patient requested a neb treatment for at home. Patient was given prednisone  and Z-pack, medications that are prescribed are up to the Doctor's discretion.

## 2023-12-19 ENCOUNTER — Encounter: Payer: Self-pay | Admitting: Internal Medicine

## 2023-12-19 ENCOUNTER — Ambulatory Visit: Admitting: Internal Medicine

## 2023-12-19 VITALS — BP 110/80 | HR 73 | Temp 98.6°F | Wt 296.8 lb

## 2023-12-19 DIAGNOSIS — E038 Other specified hypothyroidism: Secondary | ICD-10-CM | POA: Diagnosis not present

## 2023-12-19 DIAGNOSIS — J069 Acute upper respiratory infection, unspecified: Secondary | ICD-10-CM | POA: Diagnosis not present

## 2023-12-19 DIAGNOSIS — E785 Hyperlipidemia, unspecified: Secondary | ICD-10-CM | POA: Diagnosis not present

## 2023-12-19 LAB — LIPID PANEL
Cholesterol: 226 mg/dL — ABNORMAL HIGH (ref 0–200)
HDL: 64.4 mg/dL (ref >39.00)
LDL Cholesterol: 95 mg/dL (ref 0–99)
NonHDL: 161.3
Total CHOL/HDL Ratio: 4
Triglycerides: 333 mg/dL — ABNORMAL HIGH (ref 0.0–149.0)
VLDL: 66.6 mg/dL — ABNORMAL HIGH (ref 0.0–40.0)

## 2023-12-19 LAB — TSH: TSH: 10.19 u[IU]/mL — ABNORMAL HIGH (ref 0.35–5.50)

## 2023-12-19 NOTE — Progress Notes (Signed)
 Established Patient Office Visit     CC/Reason for Visit: Follow-up ear infection, requesting labs  HPI: Carlos Wheeler is a 43 y.o. male who is coming in today for the above mentioned reasons.  He was seen on April 24 by another office provider for URI with left acute otitis media.  He was prescribed prednisone  and azithromycin .  He was asked to follow-up with me in regards to this.  He is doing better, still has some congestion and a slight cough.  He is requesting labs to follow-up on his hypothyroidism and hyperlipidemia.   Past Medical/Surgical History: Past Medical History:  Diagnosis Date   Anxiety    Asthma    GERD (gastroesophageal reflux disease)    Sleep apnea     Past Surgical History:  Procedure Laterality Date   NO PAST SURGERIES      Social History:  reports that he has never smoked. He has never used smokeless tobacco. He reports that he does not currently use alcohol after a past usage of about 2.0 standard drinks of alcohol per week. He reports that he does not use drugs.  Allergies: Allergies  Allergen Reactions   Amoxicillin Nausea And Vomiting   Montelukast Other (See Comments)    Causes forgetfulness     Family History:  Family History  Problem Relation Age of Onset   Hyperlipidemia Mother    Hypertension Mother    Heart attack Father 21   Heart disease Father    Hypertension Father    Diabetes Sister    Hyperlipidemia Sister    Hypertension Sister    Diabetes Maternal Grandmother    Hypertension Maternal Grandmother    Diabetes Maternal Grandfather    Hypertension Maternal Grandfather    Heart attack Maternal Grandfather 24   Diabetes Paternal Grandmother    Stroke Paternal Grandmother 58   Diabetes Paternal Grandfather    Cancer Neg Hx      Current Outpatient Medications:    albuterol  (VENTOLIN  HFA) 108 (90 Base) MCG/ACT inhaler, Inhale 2 puffs into the lungs every 4 (four) hours as needed for wheezing or shortness of  breath., Disp: 8.5 each, Rfl: 2   albuterol  (VENTOLIN  HFA) 108 (90 Base) MCG/ACT inhaler, Inhale 1-2 puffs into the lungs every 6 (six) hours as needed., Disp: 18 g, Rfl: 0   allopurinol  (ZYLOPRIM ) 300 MG tablet, Take 1 tablet (300 mg total) by mouth daily., Disp: 30 tablet, Rfl: 3   atorvastatin  (LIPITOR) 20 MG tablet, Take 1 tablet (20 mg total) by mouth daily., Disp: 90 tablet, Rfl: 1   clobetasol  cream (TEMOVATE ) 0.05 %, APPLY TOPICALLY TWICE A DAY, Disp: 60 g, Rfl: 3   cyanocobalamin  (VITAMIN B12) 1000 MCG/ML injection, Inject 1 mL (1,000 mcg total) into the muscle every 30 (thirty) days. I, Disp: 6 mL, Rfl: 1   fluticasone  (FLONASE ) 50 MCG/ACT nasal spray, Place 2 sprays into both nostrils 2 (two) times daily., Disp: , Rfl:    levothyroxine  (SYNTHROID ) 175 MCG tablet, TAKE 1 TABLET BY MOUTH EVERY DAY, Disp: 90 tablet, Rfl: 1   SYRINGE-NEEDLE, DISP, 3 ML (B-D INTEGRA SYRINGE) 23G X 1" 3 ML MISC, Use for b12 injections, Disp: 100 each, Rfl: 3   triamcinolone  cream (KENALOG ) 0.1 %, Apply topically 2 (two) times daily., Disp: 454 g, Rfl: 2  Review of Systems:  Negative unless indicated in HPI.   Physical Exam: Vitals:   12/19/23 0740  BP: 110/80  Pulse: 73  Temp: 98.6 F (37  C)  TempSrc: Oral  SpO2: 98%  Weight: 296 lb 12.8 oz (134.6 kg)    Body mass index is 39.16 kg/m.   Physical Exam Vitals reviewed.  Constitutional:      Appearance: Normal appearance.  HENT:     Right Ear: Tympanic membrane, ear canal and external ear normal.     Left Ear: Tympanic membrane, ear canal and external ear normal.     Mouth/Throat:     Mouth: Mucous membranes are moist.     Pharynx: Oropharynx is clear.  Eyes:     Conjunctiva/sclera: Conjunctivae normal.     Pupils: Pupils are equal, round, and reactive to light.  Cardiovascular:     Rate and Rhythm: Normal rate and regular rhythm.  Pulmonary:     Effort: Pulmonary effort is normal.     Breath sounds: Normal breath sounds.   Neurological:     Mental Status: He is alert.      Impression and Plan:  Upper respiratory tract infection, unspecified type  Other specified hypothyroidism -     TSH; Future  Hyperlipidemia, unspecified hyperlipidemia type -     Lipid panel; Future   - Acute otitis media and upper respiratory infection are on the mend, check TSH and lipids.  Time spent:22 minutes reviewing chart, interviewing and examining patient and formulating plan of care.     Marguerita Shih, MD [facility]

## 2023-12-23 ENCOUNTER — Telehealth: Payer: Self-pay | Admitting: Internal Medicine

## 2023-12-23 NOTE — Telephone Encounter (Signed)
 Copied from CRM [id number]. Topic: General - Other >> Dec 23, 2023 12:05 PM Jenice Mitts wrote: Reason for CRM: Patient is calling because he would like a call to see if he would need to get his Thyroid  checked. He sent a message on mychart but didn't receive a reply

## 2023-12-24 ENCOUNTER — Other Ambulatory Visit: Payer: Self-pay | Admitting: Internal Medicine

## 2023-12-24 ENCOUNTER — Encounter: Payer: Self-pay | Admitting: Internal Medicine

## 2023-12-24 DIAGNOSIS — E038 Other specified hypothyroidism: Secondary | ICD-10-CM

## 2023-12-24 MED ORDER — LEVOTHYROXINE SODIUM 200 MCG PO TABS
200.0000 ug | ORAL_TABLET | Freq: Every day | ORAL | 1 refills | Status: DC
Start: 2023-12-24 — End: 2024-05-11

## 2023-12-24 NOTE — Telephone Encounter (Signed)
 See mychart message 12/24/23.

## 2024-01-01 NOTE — Progress Notes (Signed)
   Joanna Muck, PhD, LAT, ATC acting as a scribe for Garlan Juniper, MD.  Carlos Wheeler is a 43 y.o. male who presents to [facility] today for L elbow pain. Pt was previously seen by Dr. Alease Hunter in 2023 for TMJ and R wrist pain.  Today, pt c/o L elbow pain x 1-wk. He can't recall an injury, but does a lot of heavy lifting. He says it has a feeling of "fullness." Pt locates pain to the posterior aspect of his L elbow. He is RHD. He reports limited full elbow extension.   Radiates: no Paresthesia: no Grip strength: WNL Aggravates: elbow extension Treatments tried: IBU  Pertinent review of systems: No fevers or chills  Relevant historical information: CKD.  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  He is physically active doing weight lifting.  He also does some boxing work on a body bag.  He is also working on a truck.   Exam:  BP 126/78   Pulse (!) 102   Ht 6\' 1"  (1.854 m)   Wt 293 lb (132.9 kg)   SpO2 97%   BMI 38.66 kg/m  General: Well Developed, well nourished, and in no acute distress.   MSK: Left elbow normal-appearing Mildly tender palpation posterior elbow. Range of motion patient is able to extend his elbow fully but has pain with full extension located at the posterior elbow. Intact strength.  Pain with resisted elbow extension.    Lab and Radiology Results  Diagnostic Limited MSK Ultrasound of: Left posterior elbow Triceps tendon is intact with increased vascular activity on Doppler at the distal tendon and area around the tendon.  No visible tear is present.  No significant olecranon fossa swelling on ultrasound. Impression: Distal triceps tendinitis      Assessment and Plan: 43 y.o. male with left elbow pain due to distal triceps tendinitis.  Plan for eccentric exercises at home and Voltaren gel.  Next step if needed would be nitroglycerin patch protocol and occupational therapy referral. If not better  consider x-ray and MRI.  PDMP not reviewed this encounter. Orders Placed This Encounter  Procedures   US  LIMITED JOINT SPACE STRUCTURES UP LEFT(NO LINKED CHARGES)    Reason for Exam (SYMPTOM  OR DIAGNOSIS REQUIRED):   left elbow pain    Preferred imaging location?:   [facility]   No orders of the defined types were placed in this encounter.    Discussed warning signs or symptoms. Please see discharge instructions. Patient expresses understanding.   The above documentation has been reviewed and is accurate and complete Garlan Juniper, M.D.

## 2024-01-02 ENCOUNTER — Ambulatory Visit: Admitting: Family Medicine

## 2024-01-02 ENCOUNTER — Other Ambulatory Visit: Payer: Self-pay

## 2024-01-02 VITALS — BP 126/78 | HR 102 | Ht 73.0 in | Wt 293.0 lb

## 2024-01-02 DIAGNOSIS — M25522 Pain in left elbow: Secondary | ICD-10-CM

## 2024-01-02 DIAGNOSIS — G4733 Obstructive sleep apnea (adult) (pediatric): Secondary | ICD-10-CM | POA: Diagnosis not present

## 2024-01-02 NOTE — Patient Instructions (Addendum)
 Thank you for coming in today.   Work on triceps exercises at home  If not better let me know and I will add nitroglycerin patches and occupational therapy  Please use Voltaren gel (Generic Diclofenac Gel) up to 4x daily for pain as needed.  This is available over-the-counter as both the name brand Voltaren gel and the generic diclofenac gel.   Check back as needed

## 2024-01-15 ENCOUNTER — Other Ambulatory Visit: Payer: Self-pay | Admitting: Internal Medicine

## 2024-01-15 DIAGNOSIS — E785 Hyperlipidemia, unspecified: Secondary | ICD-10-CM

## 2024-02-11 ENCOUNTER — Ambulatory Visit: Admitting: Internal Medicine

## 2024-02-11 ENCOUNTER — Ambulatory Visit: Payer: Self-pay | Admitting: Internal Medicine

## 2024-02-11 ENCOUNTER — Other Ambulatory Visit (INDEPENDENT_AMBULATORY_CARE_PROVIDER_SITE_OTHER)

## 2024-02-11 DIAGNOSIS — E038 Other specified hypothyroidism: Secondary | ICD-10-CM

## 2024-02-11 LAB — TSH: TSH: 3.9 u[IU]/mL (ref 0.35–5.50)

## 2024-03-16 ENCOUNTER — Other Ambulatory Visit

## 2024-03-17 ENCOUNTER — Ambulatory Visit: Payer: Self-pay | Admitting: Internal Medicine

## 2024-03-17 ENCOUNTER — Other Ambulatory Visit (INDEPENDENT_AMBULATORY_CARE_PROVIDER_SITE_OTHER)

## 2024-03-17 DIAGNOSIS — E038 Other specified hypothyroidism: Secondary | ICD-10-CM

## 2024-03-17 LAB — TSH: TSH: 4.2 u[IU]/mL (ref 0.35–5.50)

## 2024-03-19 ENCOUNTER — Encounter: Payer: Self-pay | Admitting: Adult Health

## 2024-03-19 ENCOUNTER — Other Ambulatory Visit

## 2024-03-19 ENCOUNTER — Ambulatory Visit: Admitting: Adult Health

## 2024-03-19 VITALS — BP 128/100 | HR 76 | Temp 98.2°F | Ht 73.0 in | Wt 293.0 lb

## 2024-03-19 DIAGNOSIS — J988 Other specified respiratory disorders: Secondary | ICD-10-CM | POA: Diagnosis not present

## 2024-03-19 LAB — POC COVID19 BINAXNOW: SARS Coronavirus 2 Ag: NEGATIVE

## 2024-03-19 LAB — POCT RAPID STREP A (OFFICE): Rapid Strep A Screen: NEGATIVE

## 2024-03-19 MED ORDER — PREDNISONE 10 MG PO TABS: ORAL_TABLET | ORAL | 0 refills | Status: DC

## 2024-03-19 MED ORDER — HYDROCOD POLI-CHLORPHE POLI ER 10-8 MG/5ML PO SUER: 5.0000 mL | Freq: Every evening | ORAL | 0 refills | Status: DC | PRN

## 2024-03-19 NOTE — Progress Notes (Signed)
 Subjective:    Patient ID: Carlos Wheeler, male    DOB: April 06, 1981, 43 y.o.   MRN: [id number]  HPI 43 year old male who  has a past medical history of Anxiety, Asthma, GERD (gastroesophageal reflux disease), and Sleep apnea.  Discussed the use of AI scribe software for clinical note transcription with the patient, who gave verbal consent to proceed.  History of Present Illness   Carlos Wheeler is a 43 year old male with asthma who presents for an acute issue  He has been experiencing a persistent dry cough , worsening at night and disrupting sleep. Wheezing began the previous day without chest tightness. There is no fever, chills, or shortness of breath with activity. Sinus pressure is present.   He has been using  cough syrup that he was previously prescribed , which aids sleep and mucus clearance, but has run out.   He has asthma but has not used his inhaler during this episode. He usually runs a mile in the morning but was unable to do so today due to symptoms. He uses a CPAP machine at night for humidification.       Review of Systems See HPI   Past Medical History:  Diagnosis Date   Anxiety    Asthma    GERD (gastroesophageal reflux disease)    Sleep apnea     Social History   Socioeconomic History   Marital status: Married    Spouse name: [place]   Number of children: 2   Years of education: MBA   Highest education level: Not on file  Occupational History   Not on file  Tobacco Use   Smoking status: Never   Smokeless tobacco: Never  Vaping Use   Vaping status: Never Used  Substance and Sexual Activity   Alcohol use: Not Currently    Alcohol/week: 2.0 standard drinks of alcohol    Types: 2 Glasses of wine per week    Comment: quit drinking heavily 2 months ago   Drug use: No   Sexual activity: Yes    Birth control/protection: Surgical  Other Topics Concern   Not on file  Social History Narrative   09/30/18   Lives with wife and 2  children - twin boy (Cem) and girl ([name]) - born 2017   Enjoys: drag race, working on cars - trying to start a business   Work: Acupuncturist   Diet: eats good - grilled, avoids fried foods, carbs; does not eat very frequent   Exercise: does cardio every week   Social Drivers of Corporate investment banker Strain: Low Risk  (09/30/2018)   Overall Financial Resource Strain (CARDIA)    Difficulty of Paying Living Expenses: Not hard at all  Food Insecurity: Not on file  Transportation Needs: Not on file  Physical Activity: Not on file  Stress: Not on file (06/19/2023)  Social Connections: Not on file  Intimate Partner Violence: Not on file    Past Surgical History:  Procedure Laterality Date   NO PAST SURGERIES      Family History  Problem Relation Age of Onset   Hyperlipidemia Mother    Hypertension Mother    Heart attack Father 37   Heart disease Father    Hypertension Father    Diabetes Sister    Hyperlipidemia Sister    Hypertension Sister    Diabetes Maternal Grandmother    Hypertension Maternal Grandmother    Diabetes Maternal Grandfather    Hypertension Maternal  Grandfather    Heart attack Maternal Grandfather 55   Diabetes Paternal Grandmother    Stroke Paternal Grandmother 33   Diabetes Paternal Grandfather    Cancer Neg Hx     Allergies  Allergen Reactions   Amoxicillin Nausea And Vomiting   Montelukast Other (See Comments)    Causes forgetfulness     Current Outpatient Medications on File Prior to Visit  Medication Sig Dispense Refill   albuterol  (VENTOLIN  HFA) 108 (90 Base) MCG/ACT inhaler Inhale 1-2 puffs into the lungs every 6 (six) hours as needed. 18 g 0   allopurinol  (ZYLOPRIM ) 300 MG tablet Take 1 tablet (300 mg total) by mouth daily. 30 tablet 3   atorvastatin  (LIPITOR) 20 MG tablet TAKE 1 TABLET BY MOUTH EVERY DAY 90 tablet 1   clobetasol  cream (TEMOVATE ) 0.05 % APPLY TOPICALLY TWICE A DAY 60 g 3   cyanocobalamin  (VITAMIN B12) 1000  MCG/ML injection Inject 1 mL (1,000 mcg total) into the muscle every 30 (thirty) days. I 6 mL 1   fluticasone  (FLONASE ) 50 MCG/ACT nasal spray Place 2 sprays into both nostrils 2 (two) times daily.     levothyroxine  (SYNTHROID ) 200 MCG tablet Take 1 tablet (200 mcg total) by mouth daily. 90 tablet 1   SYRINGE-NEEDLE, DISP, 3 ML (B-D INTEGRA SYRINGE) 23G X 1 3 ML MISC Use for b12 injections 100 each 3   triamcinolone  cream (KENALOG ) 0.1 % Apply topically 2 (two) times daily. 454 g 2   No current facility-administered medications on file prior to visit.    BP (!) 128/100   Pulse 76   Temp 98.2 F (36.8 C) (Oral)   Ht 6' 1 (1.854 m)   Wt 293 lb (132.9 kg)   SpO2 99%   BMI 38.66 kg/m       Objective:   Physical Exam Vitals and nursing note reviewed.  Constitutional:      Appearance: Normal appearance.  HENT:     Right Ear: Tympanic membrane, ear canal and external ear normal.     Left Ear: Tympanic membrane, ear canal and external ear normal.     Nose: Nose normal. No congestion or rhinorrhea.     Mouth/Throat:     Mouth: Mucous membranes are moist.     Pharynx: Oropharynx is clear. No pharyngeal swelling or posterior oropharyngeal erythema.     Tonsils: No tonsillar exudate or tonsillar abscesses.  Cardiovascular:     Rate and Rhythm: Normal rate and regular rhythm.     Pulses: Normal pulses.     Heart sounds: Normal heart sounds.  Pulmonary:     Effort: Pulmonary effort is normal.     Breath sounds: No stridor. Wheezing (trace exp. wheezing throughout) present. No rhonchi or rales.  Musculoskeletal:        General: Normal range of motion.  Skin:    General: Skin is warm and dry.  Neurological:     General: No focal deficit present.     Mental Status: He is alert and oriented to person, place, and time.  Psychiatric:        Mood and Affect: Mood normal.        Behavior: Behavior normal.        Thought Content: Thought content normal.        Judgment: Judgment  normal.        Assessment & Plan:  1. Respiratory infection (Primary) - Likely acute bronchitis. Will prescribed a prednisone  taper and cough syrup.  - Follow  up if not improving in the next week or sooner if symptoms worse or fever develops  - POC COVID-19 BinaxNow- Negative - POCT rapid strep A- Negative - predniSONE  (DELTASONE ) 10 MG tablet; 40 mg x 3 days, 20 mg x 3 days, 10 mg x 3 days  Dispense: 21 tablet; Refill: 0 - chlorpheniramine-HYDROcodone  (TUSSIONEX) 10-8 MG/5ML; Take 5 mLs by mouth at bedtime as needed for cough.  Dispense: 115 mL; Refill: 0  I personally spent a total of 31 minutes in the care of the patient today including preparing to see the patient, getting/reviewing separately obtained history, performing a medically appropriate exam/evaluation, counseling and educating, placing orders, documenting clinical information in the EHR, and communicating results.

## 2024-03-24 ENCOUNTER — Encounter: Payer: Self-pay | Admitting: Adult Health

## 2024-03-24 NOTE — Telephone Encounter (Signed)
**Note De-identified  [place] Obfuscation** Please advise 

## 2024-03-25 MED ORDER — ALBUTEROL SULFATE HFA 108 (90 BASE) MCG/ACT IN AERS: 1.0000 | INHALATION_SPRAY | Freq: Four times a day (QID) | RESPIRATORY_TRACT | 0 refills | Status: AC | PRN

## 2024-03-25 NOTE — Telephone Encounter (Signed)
 This was meant for [facility] my apologies. Please disregard.

## 2024-03-25 NOTE — Telephone Encounter (Signed)
 albuterol  inhaler called into the pharmacy. Tried to call pt to advise but no answer.

## 2024-04-12 ENCOUNTER — Other Ambulatory Visit: Payer: Self-pay | Admitting: Internal Medicine

## 2024-04-12 DIAGNOSIS — E538 Deficiency of other specified B group vitamins: Secondary | ICD-10-CM

## 2024-04-27 ENCOUNTER — Encounter: Payer: Self-pay | Admitting: Internal Medicine

## 2024-04-27 DIAGNOSIS — E559 Vitamin D deficiency, unspecified: Secondary | ICD-10-CM

## 2024-04-27 DIAGNOSIS — E782 Mixed hyperlipidemia: Secondary | ICD-10-CM

## 2024-04-27 DIAGNOSIS — E039 Hypothyroidism, unspecified: Secondary | ICD-10-CM

## 2024-04-28 ENCOUNTER — Other Ambulatory Visit (INDEPENDENT_AMBULATORY_CARE_PROVIDER_SITE_OTHER)

## 2024-04-28 DIAGNOSIS — E039 Hypothyroidism, unspecified: Secondary | ICD-10-CM | POA: Diagnosis not present

## 2024-04-28 DIAGNOSIS — E782 Mixed hyperlipidemia: Secondary | ICD-10-CM | POA: Diagnosis not present

## 2024-04-28 LAB — LIPID PANEL
Cholesterol: 181 mg/dL (ref 0–200)
HDL: 62.1 mg/dL (ref >39.00)
LDL Cholesterol: 72 mg/dL (ref 0–99)
NonHDL: 118.46
Total CHOL/HDL Ratio: 3
Triglycerides: 232 mg/dL — ABNORMAL HIGH (ref 0.0–149.0)
VLDL: 46.4 mg/dL — ABNORMAL HIGH (ref 0.0–40.0)

## 2024-04-28 LAB — TSH: TSH: 1.54 u[IU]/mL (ref 0.35–5.50)

## 2024-04-30 ENCOUNTER — Ambulatory Visit: Payer: Self-pay | Admitting: Internal Medicine

## 2024-05-10 ENCOUNTER — Other Ambulatory Visit: Payer: Self-pay | Admitting: Internal Medicine

## 2024-05-10 DIAGNOSIS — E038 Other specified hypothyroidism: Secondary | ICD-10-CM

## 2024-06-29 DIAGNOSIS — G4733 Obstructive sleep apnea (adult) (pediatric): Secondary | ICD-10-CM | POA: Diagnosis not present

## 2024-07-01 ENCOUNTER — Encounter: Payer: Self-pay | Admitting: Internal Medicine

## 2024-07-02 NOTE — Telephone Encounter (Signed)
 Left message on machine for patient to return our call

## 2024-07-13 ENCOUNTER — Other Ambulatory Visit: Payer: Self-pay | Admitting: Internal Medicine

## 2024-07-13 DIAGNOSIS — E785 Hyperlipidemia, unspecified: Secondary | ICD-10-CM

## 2024-07-29 DIAGNOSIS — G4733 Obstructive sleep apnea (adult) (pediatric): Secondary | ICD-10-CM | POA: Diagnosis not present

## 2024-08-28 ENCOUNTER — Ambulatory Visit: Admitting: Family Medicine

## 2024-08-28 ENCOUNTER — Ambulatory Visit: Payer: Self-pay

## 2024-08-28 ENCOUNTER — Encounter: Payer: Self-pay | Admitting: Family Medicine

## 2024-08-28 VITALS — BP 164/100 | HR 75 | Temp 98.3°F | Ht 73.0 in | Wt 301.0 lb

## 2024-08-28 DIAGNOSIS — R030 Elevated blood-pressure reading, without diagnosis of hypertension: Secondary | ICD-10-CM | POA: Diagnosis not present

## 2024-08-28 DIAGNOSIS — R051 Acute cough: Secondary | ICD-10-CM | POA: Diagnosis not present

## 2024-08-28 DIAGNOSIS — J069 Acute upper respiratory infection, unspecified: Secondary | ICD-10-CM | POA: Diagnosis not present

## 2024-08-28 NOTE — Telephone Encounter (Signed)
 FYI Only or Action Required?: FYI only for provider: appointment scheduled on today.  Patient was last seen in primary care on 03/19/2024 by Merna Huxley, NP.  Called Nurse Triage reporting Sinusitis and Cough.  Symptoms began a week ago.  Interventions attempted: OTC medications: Mucinex and Rest, hydration, or home remedies.  Symptoms are: temporary relief with Mucinex.  Triage Disposition: See PCP When Office is Open (Within 3 Days)  Patient/caregiver understands and will follow disposition?: Yes  Reason for Disposition  [1] Sinus congestion (pressure, fullness) AND [2] present > 10 days  Answer Assessment - Initial Assessment Questions 1. LOCATION: Where does it hurt?      Sinuses  2. ONSET: When did the sinus pain start?  (e.g., hours, days)      One week  3. SEVERITY: How bad is the pain?   (Scale 0-10; or none, mild, moderate or severe)     pressure 4. RECURRENT SYMPTOM: Have you ever had sinus problems before? If Yes, ask: When was the last time? and What happened that time?      yes 5. NASAL CONGESTION: Is the nose blocked? If Yes, ask: Can you open it or must you breathe through your mouth?     yes 6. NASAL DISCHARGE: Do you have discharge from your nose? If so ask, What color?     no 7. FEVER: Do you have a fever? If Yes, ask: What is it, how was it measured, and when did it start?      denies 8. OTHER SYMPTOMS: Do you have any other symptoms? (e.g., sore throat, cough, earache, difficulty breathing)     Productive cough dark yellow and green-denies breathing difficulties and chest pain.  Protocols used: Sinus Pain or Congestion-A-AH   Message from Mia F sent at 08/28/2024 11:56 AM EST  Reason for Triage: Sinus pressure and congestion that has been going on for a week. Has a cough that has some colored mucus. Cough is worse at night.

## 2024-08-28 NOTE — Progress Notes (Signed)
 "  Established Patient Office Visit   Subjective  Patient ID: Carlos Wheeler, male    DOB: 05/06/1981  Age: 44 y.o. MRN: [id number]  Chief Complaint  Patient presents with   Acute Visit    Cough and sinus congestion, SOB, Mucus yellow    Patient is a 44 year old male followed by Dr. Theophilus and seen for acute concern.  Patient endorses nasal congestion/stuffy head, cough at night x several days.  May have started last week.  Denies wheezing, fever, headaches, sore throat, facial pressure.    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   Acute shoulder bursitis, left 09/29/2020   Vitamin D  deficiency 09/09/2020   Vitamin B12 deficiency 09/09/2020   Hyperlipidemia 08/25/2019   Hypothyroidism 08/25/2019   IGT (impaired glucose tolerance) 08/25/2019   CKD (chronic kidney disease) stage 3, GFR 30-59 ml/min (HCC) 06/09/2019   Elevated LFTs 06/09/2019   Hand numbness 06/09/2019   Loss of taste 05/11/2019   History of heavy alcohol consumption 05/10/2019   Low back pain 04/24/2019   Elevated random blood glucose level 02/05/2019   Anxiety 09/30/2018   Obesity (BMI 30.0-34.9) 12/04/2016   GAD (generalized anxiety disorder) 12/04/2016   Obstructive sleep apnea of adult 08/02/2016   Past Medical History:  Diagnosis Date   Anxiety    Asthma    GERD (gastroesophageal reflux disease)    Sleep apnea    Past Surgical History:  Procedure Laterality Date   NO PAST SURGERIES     Social History[1] Family History  Problem Relation Age of Onset   Hyperlipidemia Mother    Hypertension Mother    Heart attack Father 39   Heart disease Father    Hypertension Father    Diabetes Sister    Hyperlipidemia Sister    Hypertension Sister    Diabetes Maternal Grandmother    Hypertension Maternal Grandmother    Diabetes Maternal Grandfather    Hypertension Maternal  Grandfather    Heart attack Maternal Grandfather 69   Diabetes Paternal Grandmother    Stroke Paternal Grandmother 57   Diabetes Paternal Grandfather    Cancer Neg Hx    Allergies[2]  ROS Negative unless stated above    Objective:     BP (!) 164/100 (BP Location: Left Arm, Patient Position: Sitting, Cuff Size: Large)   Pulse 75   Temp 98.3 F (36.8 C) (Oral)   Ht 6' 1 (1.854 m)   Wt (!) 301 lb (136.5 kg)   SpO2 98%   BMI 39.71 kg/m  BP Readings from Last 3 Encounters:  08/28/24 (!) 164/100  03/19/24 (!) 128/100  01/02/24 126/78   Wt Readings from Last 3 Encounters:  08/28/24 (!) 301 lb (136.5 kg)  03/19/24 293 lb (132.9 kg)  01/02/24 293 lb (132.9 kg)      Physical Exam Constitutional:      General: He is not in acute distress.    Appearance: Normal appearance.  HENT:     Head: Normocephalic and atraumatic.     Nose: Nose normal.     Right Sinus: No maxillary sinus tenderness or frontal sinus tenderness.     Left Sinus: No maxillary sinus tenderness or frontal sinus tenderness.     Mouth/Throat:     Mouth: Mucous membranes are moist.  Cardiovascular:     Rate and Rhythm: Normal rate and regular rhythm.  Heart sounds: Normal heart sounds. No murmur heard.    No gallop.  Pulmonary:     Effort: Pulmonary effort is normal. No respiratory distress.     Breath sounds: Normal breath sounds. No wheezing, rhonchi or rales.  Skin:    General: Skin is warm and dry.  Neurological:     Mental Status: He is alert and oriented to person, place, and time.        07/24/2023    7:46 AM 04/18/2023    1:26 PM 03/22/2023    2:02 PM  Depression screen PHQ 2/9  Decreased Interest 0 0 0  Down, Depressed, Hopeless 0 0 0  PHQ - 2 Score 0 0 0  Altered sleeping   0  Tired, decreased energy  0 0  Change in appetite  0 0  Feeling bad or failure about yourself   0 0  Trouble concentrating  0 0  Moving slowly or fidgety/restless  0 0  Suicidal thoughts  0 0  PHQ-9 Score    0      Data saved with a previous flowsheet row definition      03/22/2023    2:02 PM 08/16/2021    1:26 PM 11/25/2018    2:25 PM 09/30/2018   12:17 PM  GAD 7 : Generalized Anxiety Score  Nervous, Anxious, on Edge 0 1 2 3   Control/stop worrying 0 0 1 1  Worry too much - different things 0 1 2 2   Trouble relaxing 0 0 0 3  Restless 0 3 2 3   Easily annoyed or irritable 0 0 0 1  Afraid - awful might happen 0 0 0 1  Total GAD 7 Score 0 5 7 14   Anxiety Difficulty Not difficult at all  Not difficult at all Not difficult at all     No results found for any visits on 08/28/24.    Assessment & Plan:   Viral URI  Acute cough  Elevated blood pressure reading in office without diagnosis of hypertension  Acute URI symptoms improving/nearly resolved.  Continue supportive care with OTC cough/cold medications, rest, time.  Discussed lifestyle modifications and need for medicine for HTN.  BP this visit 164/100.  Previously 128/100.  Patient denies HTN.  Advised to obtain BP cuff and monitor at home as well as discuss with PCP in a few weeks.  Return in about 2 weeks (around 09/11/2024) for blood pressure with pcp.   Clotilda JONELLE Single, MD     [1]  Social History Tobacco Use   Smoking status: Never   Smokeless tobacco: Never  Vaping Use   Vaping status: Never Used  Substance Use Topics   Alcohol use: Not Currently    Alcohol/week: 2.0 standard drinks of alcohol    Types: 2 Glasses of wine per week    Comment: quit drinking heavily 2 months ago   Drug use: No  [2]  Allergies Allergen Reactions   Amoxicillin Nausea And Vomiting   Montelukast Other (See Comments)    Causes forgetfulness    "

## 2024-08-31 NOTE — Telephone Encounter (Signed)
 noted

## 2024-09-02 ENCOUNTER — Ambulatory Visit: Admitting: Internal Medicine

## 2024-09-02 ENCOUNTER — Telehealth: Payer: Self-pay | Admitting: *Deleted

## 2024-09-02 ENCOUNTER — Encounter: Payer: Self-pay | Admitting: Family Medicine

## 2024-09-02 ENCOUNTER — Ambulatory Visit: Admitting: Family Medicine

## 2024-09-02 VITALS — BP 126/80 | HR 89 | Temp 98.1°F | Wt 294.8 lb

## 2024-09-02 DIAGNOSIS — J4521 Mild intermittent asthma with (acute) exacerbation: Secondary | ICD-10-CM | POA: Diagnosis not present

## 2024-09-02 DIAGNOSIS — R058 Other specified cough: Secondary | ICD-10-CM

## 2024-09-02 DIAGNOSIS — E039 Hypothyroidism, unspecified: Secondary | ICD-10-CM

## 2024-09-02 MED ORDER — DOXYCYCLINE HYCLATE 100 MG PO CAPS: 100.0000 mg | ORAL_CAPSULE | Freq: Two times a day (BID) | ORAL | 0 refills | Status: AC

## 2024-09-02 MED ORDER — HYDROCODONE BIT-HOMATROP MBR 5-1.5 MG/5ML PO SOLN: 5.0000 mL | Freq: Four times a day (QID) | ORAL | 0 refills | Status: AC | PRN

## 2024-09-02 MED ORDER — PREDNISONE 20 MG PO TABS: ORAL_TABLET | ORAL | 0 refills | Status: AC

## 2024-09-02 NOTE — Telephone Encounter (Signed)
 Patient had an appointment with Dr Micheal today.  He has scheduled his physical for 12/15/2024.  He would like to know if he can have his thyroid  level checked before his appointment?

## 2024-09-02 NOTE — Progress Notes (Signed)
 "  Established Patient Office Visit  Subjective   Patient ID: Carlos Wheeler, male    DOB: 21-Jun-1981  Age: 44 y.o. MRN: [id number]  Chief Complaint  Patient presents with   Cough    HPI    [name] is seen with cough and wheezing over the past 1-1/2 weeks.  Cough now productive of green sputum.  He states he has mild intermittent asthma.  Very health-conscious.  Non-smoker.  Exercises fairly regularly with combination of aerobic and resistance training.  Exercise somewhat curtailed past few days secondary to illness.  No known sick contacts.  He states he usually gets bronchitis similar to this about once per year.  Past Medical History:  Diagnosis Date   Anxiety    Asthma    GERD (gastroesophageal reflux disease)    Sleep apnea    Past Surgical History:  Procedure Laterality Date   NO PAST SURGERIES      reports that he has never smoked. He has never used smokeless tobacco. He reports that he does not currently use alcohol after a past usage of about 2.0 standard drinks of alcohol per week. He reports that he does not use drugs. family history includes Diabetes in his maternal grandfather, maternal grandmother, paternal grandfather, paternal grandmother, and sister; Heart attack (age of onset: 32) in his father and maternal grandfather; Heart disease in his father; Hyperlipidemia in his mother and sister; Hypertension in his father, maternal grandfather, maternal grandmother, mother, and sister; Stroke (age of onset: 18) in his paternal grandmother. Allergies[1]  Review of Systems  Constitutional:  Negative for chills and fever.  HENT:  Negative for sore throat.   Respiratory:  Positive for cough, sputum production and wheezing. Negative for hemoptysis.   Cardiovascular:  Negative for chest pain.      Objective:     BP 126/80   Pulse 89   Temp 98.1 F (36.7 C) (Oral)   Wt 294 lb 12.8 oz (133.7 kg)   SpO2 97%   BMI 38.89 kg/m  BP Readings from Last 3 Encounters:   09/02/24 126/80  08/28/24 (!) 164/100  03/19/24 (!) 128/100   Wt Readings from Last 3 Encounters:  09/02/24 294 lb 12.8 oz (133.7 kg)  08/28/24 (!) 301 lb (136.5 kg)  03/19/24 293 lb (132.9 kg)      Physical Exam Vitals reviewed.  Constitutional:      General: He is not in acute distress.    Appearance: He is not ill-appearing.  Cardiovascular:     Rate and Rhythm: Normal rate and regular rhythm.  Pulmonary:     Effort: Pulmonary effort is normal.     Breath sounds: Wheezing present. No rales.     Comments: He has some scattered diffuse wheezes.  No rales.  No retractions.  Pulse ox 97% room air Neurological:     Mental Status: He is alert.      No results found for any visits on 09/02/24.    The 10-year ASCVD risk score (Arnett DK, et al., 2019) is: 2.9%    Assessment & Plan:   History of intermittent asthma.  Seen with 1-1/2-week history of productive cough and wheezing.  No respiratory distress.  We suggested the following:  -Use albuterol  MDI 2 puffs every 4 hours as needed for cough and wheeze - Doxycycline  100 mg twice daily for 7 days - Start prednisone  20 mg 2 tablets daily for 5 days - Wrote for limited Hycodan cough syrup 1 teaspoon nightly as needed  for severe cough - Follow-up for any fever or increasing shortness of breath  Wolm Scarlet, MD     [1]  Allergies Allergen Reactions   Amoxicillin Nausea And Vomiting   Montelukast Other (See Comments)    Causes forgetfulness    "

## 2024-09-16 NOTE — Addendum Note (Signed)
 Addended by: KATHRYNE MILLMAN B on: 09/16/2024 01:38 PM   Modules accepted: Orders

## 2024-12-15 ENCOUNTER — Encounter: Admitting: Internal Medicine
